# Patient Record
Sex: Male | Born: 1950
Health system: Southern US, Community
[De-identification: ages and names within clinical notes are randomized; demographics above are authoritative.]

## PROBLEM LIST (undated history)

## (undated) DIAGNOSIS — K5730 Diverticulosis of large intestine without perforation or abscess without bleeding: Secondary | ICD-10-CM

## (undated) DIAGNOSIS — T7840XA Allergy, unspecified, initial encounter: Secondary | ICD-10-CM

## (undated) DIAGNOSIS — R351 Nocturia: Secondary | ICD-10-CM

## (undated) DIAGNOSIS — J45909 Unspecified asthma, uncomplicated: Secondary | ICD-10-CM

## (undated) DIAGNOSIS — Z800 Family history of malignant neoplasm of digestive organs: Secondary | ICD-10-CM

## (undated) DIAGNOSIS — F329 Major depressive disorder, single episode, unspecified: Secondary | ICD-10-CM

## (undated) DIAGNOSIS — M19079 Primary osteoarthritis, unspecified ankle and foot: Secondary | ICD-10-CM

## (undated) DIAGNOSIS — M25571 Pain in right ankle and joints of right foot: Secondary | ICD-10-CM

## (undated) DIAGNOSIS — M2550 Pain in unspecified joint: Secondary | ICD-10-CM

## (undated) DIAGNOSIS — M19071 Primary osteoarthritis, right ankle and foot: Secondary | ICD-10-CM

## (undated) DIAGNOSIS — F419 Anxiety disorder, unspecified: Secondary | ICD-10-CM

## (undated) DIAGNOSIS — F32A Depression, unspecified: Secondary | ICD-10-CM

## (undated) DIAGNOSIS — H269 Unspecified cataract: Secondary | ICD-10-CM

## (undated) DIAGNOSIS — Z9889 Other specified postprocedural states: Secondary | ICD-10-CM

## (undated) DIAGNOSIS — Z86718 Personal history of other venous thrombosis and embolism: Secondary | ICD-10-CM

## (undated) DIAGNOSIS — S069XAA Unspecified intracranial injury with loss of consciousness status unknown, initial encounter: Secondary | ICD-10-CM

## (undated) DIAGNOSIS — G8929 Other chronic pain: Secondary | ICD-10-CM

## (undated) DIAGNOSIS — I83009 Varicose veins of unspecified lower extremity with ulcer of unspecified site: Secondary | ICD-10-CM

## (undated) DIAGNOSIS — Z86010 Personal history of colonic polyps: Secondary | ICD-10-CM

## (undated) DIAGNOSIS — R0789 Other chest pain: Secondary | ICD-10-CM

## (undated) DIAGNOSIS — M79672 Pain in left foot: Secondary | ICD-10-CM

## (undated) DIAGNOSIS — R0609 Other forms of dyspnea: Secondary | ICD-10-CM

## (undated) DIAGNOSIS — E7800 Pure hypercholesterolemia, unspecified: Secondary | ICD-10-CM

## (undated) DIAGNOSIS — R05 Cough: Secondary | ICD-10-CM

## (undated) DIAGNOSIS — I8390 Asymptomatic varicose veins of unspecified lower extremity: Secondary | ICD-10-CM

## (undated) DIAGNOSIS — E785 Hyperlipidemia, unspecified: Secondary | ICD-10-CM

## (undated) DIAGNOSIS — R269 Unspecified abnormalities of gait and mobility: Secondary | ICD-10-CM

## (undated) DIAGNOSIS — F988 Other specified behavioral and emotional disorders with onset usually occurring in childhood and adolescence: Secondary | ICD-10-CM

## (undated) DIAGNOSIS — M1990 Unspecified osteoarthritis, unspecified site: Secondary | ICD-10-CM

## (undated) DIAGNOSIS — I319 Disease of pericardium, unspecified: Secondary | ICD-10-CM

## (undated) DIAGNOSIS — I10 Essential (primary) hypertension: Secondary | ICD-10-CM

## (undated) DIAGNOSIS — L97909 Non-pressure chronic ulcer of unspecified part of unspecified lower leg with unspecified severity: Secondary | ICD-10-CM

## (undated) DIAGNOSIS — M7581 Other shoulder lesions, right shoulder: Secondary | ICD-10-CM

## (undated) DIAGNOSIS — I712 Thoracic aortic aneurysm, without rupture: Secondary | ICD-10-CM

## (undated) DIAGNOSIS — IMO0002 Reserved for concepts with insufficient information to code with codable children: Secondary | ICD-10-CM

## (undated) DIAGNOSIS — J189 Pneumonia, unspecified organism: Secondary | ICD-10-CM

## (undated) DIAGNOSIS — M25512 Pain in left shoulder: Secondary | ICD-10-CM

## (undated) DIAGNOSIS — G629 Polyneuropathy, unspecified: Secondary | ICD-10-CM

## (undated) DIAGNOSIS — S29011A Strain of muscle and tendon of front wall of thorax, initial encounter: Secondary | ICD-10-CM

## (undated) DIAGNOSIS — T464X5A Adverse effect of angiotensin-converting-enzyme inhibitors, initial encounter: Secondary | ICD-10-CM

## (undated) DIAGNOSIS — S069X9A Unspecified intracranial injury with loss of consciousness of unspecified duration, initial encounter: Secondary | ICD-10-CM

## (undated) DIAGNOSIS — Z0000 Encounter for general adult medical examination without abnormal findings: Secondary | ICD-10-CM

## (undated) DIAGNOSIS — Z5189 Encounter for other specified aftercare: Secondary | ICD-10-CM

## (undated) DIAGNOSIS — M79671 Pain in right foot: Secondary | ICD-10-CM

## (undated) DIAGNOSIS — R41844 Frontal lobe and executive function deficit: Secondary | ICD-10-CM

## (undated) DIAGNOSIS — R0602 Shortness of breath: Secondary | ICD-10-CM

## (undated) HISTORY — DX: Encounter for other specified aftercare: Z51.89

## (undated) HISTORY — DX: Unspecified intracranial injury with loss of consciousness of unspecified duration, initial encounter: S06.9X9A

## (undated) HISTORY — DX: Anxiety disorder, unspecified: F41.9

## (undated) HISTORY — PX: POLYPECTOMY: SHX149

## (undated) HISTORY — DX: Other specified behavioral and emotional disorders with onset usually occurring in childhood and adolescence: F98.8

## (undated) HISTORY — DX: Unspecified intracranial injury with loss of consciousness status unknown, initial encounter: S06.9XAA

## (undated) HISTORY — DX: Hyperlipidemia, unspecified: E78.5

## (undated) HISTORY — DX: Strain of muscle and tendon of front wall of thorax, initial encounter: S29.011A

## (undated) HISTORY — DX: Pain in right foot: M79.671

## (undated) HISTORY — DX: Pain in right ankle and joints of right foot: M25.571

## (undated) HISTORY — DX: Adverse effect of angiotensin-converting-enzyme inhibitors, initial encounter: T46.4X5A

## (undated) HISTORY — DX: Diverticulosis of large intestine without perforation or abscess without bleeding: K57.30

## (undated) HISTORY — DX: Frontal lobe and executive function deficit: R41.844

## (undated) HISTORY — DX: Polyneuropathy, unspecified: G62.9

## (undated) HISTORY — PX: COLONOSCOPY W/ POLYPECTOMY: SHX1380

## (undated) HISTORY — DX: Personal history of colonic polyps: Z86.010

## (undated) HISTORY — DX: Varicose veins of unspecified lower extremity with ulcer of unspecified site: I83.009

## (undated) HISTORY — DX: Unspecified osteoarthritis, unspecified site: M19.90

## (undated) HISTORY — DX: Pure hypercholesterolemia, unspecified: E78.00

## (undated) HISTORY — DX: Primary osteoarthritis, right ankle and foot: M19.071

## (undated) HISTORY — DX: Unspecified cataract: H26.9

## (undated) HISTORY — DX: Cough: R05

## (undated) HISTORY — PX: OTHER SURGICAL HISTORY: SHX169

## (undated) HISTORY — DX: Other shoulder lesions, right shoulder: M75.81

## (undated) HISTORY — DX: Pain in left foot: M79.672

## (undated) HISTORY — PX: FOOT FUSION: SHX956

## (undated) HISTORY — DX: Thoracic aortic aneurysm, without rupture: I71.2

## (undated) HISTORY — DX: Other forms of dyspnea: R06.09

## (undated) HISTORY — DX: Shortness of breath: R06.02

## (undated) HISTORY — DX: Family history of malignant neoplasm of digestive organs: Z80.0

## (undated) HISTORY — DX: Asymptomatic varicose veins of unspecified lower extremity: I83.90

## (undated) HISTORY — DX: Allergy, unspecified, initial encounter: T78.40XA

## (undated) HISTORY — DX: Reserved for concepts with insufficient information to code with codable children: IMO0002

## (undated) HISTORY — DX: Other specified postprocedural states: Z98.890

## (undated) HISTORY — DX: Encounter for general adult medical examination without abnormal findings: Z00.00

## (undated) HISTORY — DX: Primary osteoarthritis, unspecified ankle and foot: M19.079

## (undated) HISTORY — DX: Essential (primary) hypertension: I10

## (undated) HISTORY — DX: Pain in left shoulder: M25.512

## (undated) HISTORY — DX: Other chronic pain: G89.29

## (undated) HISTORY — DX: Unspecified asthma, uncomplicated: J45.909

## (undated) HISTORY — DX: Nocturia: R35.1

## (undated) HISTORY — DX: Pain in unspecified joint: M25.50

## (undated) HISTORY — PX: COLONOSCOPY: SHX174

## (undated) HISTORY — DX: Other chest pain: R07.89

## (undated) HISTORY — DX: Non-pressure chronic ulcer of unspecified part of unspecified lower leg with unspecified severity: L97.909

## (undated) HISTORY — DX: Personal history of other venous thrombosis and embolism: Z86.718

---
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
## 1975-04-10 HISTORY — PX: BURR HOLE OF CRANIUM: SUR169

## 1999-05-07 ENCOUNTER — Encounter: Payer: Self-pay | Admitting: Cardiology

## 1999-05-07 ENCOUNTER — Ambulatory Visit (HOSPITAL_COMMUNITY): Admission: RE | Admit: 1999-05-07 | Discharge: 1999-05-07 | Payer: Self-pay | Admitting: Cardiology

## 2001-07-14 ENCOUNTER — Encounter: Payer: Self-pay | Admitting: Orthopedic Surgery

## 2001-07-17 ENCOUNTER — Inpatient Hospital Stay (HOSPITAL_COMMUNITY): Admission: RE | Admit: 2001-07-17 | Discharge: 2001-07-20 | Payer: Self-pay | Admitting: Orthopedic Surgery

## 2001-07-17 ENCOUNTER — Encounter: Payer: Self-pay | Admitting: Orthopedic Surgery

## 2003-02-19 ENCOUNTER — Encounter: Admission: RE | Admit: 2003-02-19 | Discharge: 2003-02-19 | Payer: Self-pay | Admitting: Family Medicine

## 2003-02-19 ENCOUNTER — Encounter: Payer: Self-pay | Admitting: Family Medicine

## 2004-06-01 ENCOUNTER — Encounter: Admission: RE | Admit: 2004-06-01 | Discharge: 2004-06-01 | Payer: Self-pay | Admitting: Family Medicine

## 2005-11-23 ENCOUNTER — Ambulatory Visit: Payer: Self-pay | Admitting: Internal Medicine

## 2005-12-02 ENCOUNTER — Encounter (INDEPENDENT_AMBULATORY_CARE_PROVIDER_SITE_OTHER): Payer: Self-pay | Admitting: *Deleted

## 2005-12-02 ENCOUNTER — Ambulatory Visit: Payer: Self-pay | Admitting: Internal Medicine

## 2009-07-23 ENCOUNTER — Encounter: Admission: RE | Admit: 2009-07-23 | Discharge: 2009-07-23 | Payer: Self-pay | Admitting: Family Medicine

## 2009-09-17 ENCOUNTER — Ambulatory Visit: Payer: Self-pay | Admitting: Vascular Surgery

## 2009-11-07 ENCOUNTER — Encounter: Payer: Self-pay | Admitting: Cardiology

## 2009-11-07 ENCOUNTER — Ambulatory Visit: Payer: Self-pay | Admitting: Vascular Surgery

## 2009-11-12 ENCOUNTER — Ambulatory Visit (HOSPITAL_COMMUNITY): Admission: RE | Admit: 2009-11-12 | Discharge: 2009-11-13 | Payer: Self-pay | Admitting: Neurosurgery

## 2010-05-21 ENCOUNTER — Ambulatory Visit: Payer: Self-pay | Admitting: Cardiology

## 2010-07-28 ENCOUNTER — Ambulatory Visit: Payer: Self-pay | Admitting: Vascular Surgery

## 2010-09-01 ENCOUNTER — Encounter
Admission: RE | Admit: 2010-09-01 | Discharge: 2010-09-01 | Payer: Self-pay | Source: Home / Self Care | Attending: Neurosurgery | Admitting: Neurosurgery

## 2010-09-02 ENCOUNTER — Ambulatory Visit: Payer: Self-pay | Admitting: Vascular Surgery

## 2010-09-02 HISTORY — PX: ENDOVENOUS ABLATION SAPHENOUS VEIN W/ LASER: SUR449

## 2010-09-08 ENCOUNTER — Ambulatory Visit: Payer: Self-pay | Admitting: Vascular Surgery

## 2010-09-14 ENCOUNTER — Ambulatory Visit (HOSPITAL_COMMUNITY)
Admission: RE | Admit: 2010-09-14 | Discharge: 2010-09-14 | Payer: Self-pay | Source: Home / Self Care | Attending: Surgery | Admitting: Surgery

## 2010-09-14 ENCOUNTER — Other Ambulatory Visit: Payer: Self-pay | Admitting: Surgery

## 2010-10-01 ENCOUNTER — Emergency Department (HOSPITAL_COMMUNITY)
Admission: EM | Admit: 2010-10-01 | Discharge: 2010-10-01 | Payer: Self-pay | Source: Home / Self Care | Admitting: Emergency Medicine

## 2010-11-26 ENCOUNTER — Encounter: Payer: Self-pay | Admitting: Internal Medicine

## 2010-12-01 NOTE — Letter (Signed)
Summary: Colonoscopy Letter  [facility]  [address]   [place], [place] [address]   Phone: [phone]  Fax: [phone]      November 26, 2010 MRN: [id number]   Trevor [name] [address] [place], [place]  [address]   Dear Mr. KOWALKE,   According to your medical record, it is time for you to schedule a Colonoscopy. The American Cancer Society recommends this procedure as a method to detect early colon cancer. Patients with a family history of colon cancer, or a personal history of colon polyps or inflammatory bowel disease are at increased risk.  This letter has been generated based on the recommendations made at the time of your procedure. If you feel that in your particular situation this may no longer apply, please contact our office.  Please call our office at [phone] to schedule this appointment or to update your records at your earliest convenience.  Thank you for cooperating with [place] to provide you with the very best care possible.   Sincerely,   Stan Head, M.D.  [facility] Gastroenterology Division [phone]

## 2010-12-02 LAB — CBC
HCT: 39.7 % (ref 39.0–52.0)
Hemoglobin: 13.7 g/dL (ref 13.0–17.0)
MCHC: 34.4 g/dL (ref 30.0–36.0)
MCV: 92.2 fL (ref 78.0–100.0)
Platelets: 217 10*3/uL (ref 150–400)
RBC: 4.31 MIL/uL (ref 4.22–5.81)
RDW: 12.9 % (ref 11.5–15.5)
WBC: 7.6 10*3/uL (ref 4.0–10.5)

## 2010-12-02 LAB — BASIC METABOLIC PANEL
BUN: 22 mg/dL (ref 6–23)
CO2: 28 mEq/L (ref 19–32)
Calcium: 9 mg/dL (ref 8.4–10.5)
Chloride: 107 mEq/L (ref 96–112)
Creatinine, Ser: 1.08 mg/dL (ref 0.4–1.5)
GFR calc Af Amer: >60 mL/min (ref >60)
GFR calc non Af Amer: >60 mL/min (ref >60)
Glucose, Bld: 107 mg/dL — ABNORMAL HIGH (ref 70–99)
Potassium: 4.6 mEq/L (ref 3.5–5.1)
Sodium: 139 mEq/L (ref 135–145)

## 2010-12-02 LAB — SURGICAL PCR SCREEN
MRSA, PCR: NEGATIVE
Staphylococcus aureus: NEGATIVE

## 2010-12-08 ENCOUNTER — Encounter (INDEPENDENT_AMBULATORY_CARE_PROVIDER_SITE_OTHER): Payer: Self-pay | Admitting: *Deleted

## 2010-12-15 NOTE — Letter (Signed)
Summary: Pre Visit Letter Revised  [facility]  [address] [place], [place] [address]   Phone: [phone]  Fax: [phone]        12/08/2010 MRN: [id number] Trevor [name] [address] [place], [place]  [address]             Procedure Date:  02/04/2011 @ 8:30   recall colon-Dr. Elzie Rings   Welcome to the Gastroenterology Division at [facility].    You are scheduled to see a nurse for your pre-procedure visit on 01/21/2011 at 10:30 on the 3rd floor at [facility], [address]  We ask that you try to arrive at our office 15 minutes prior to your appointment time to allow for check-in.  Please take a minute to review the attached form.  If you answer "Yes" to one or more of the questions on the first page, we ask that you call the person listed at your earliest opportunity.  If you answer "No" to all of the questions, please complete the rest of the form and bring it to your appointment.    Your nurse visit will consist of discussing your medical and surgical history, your immediate family medical history, and your medications.   If you are unable to list all of your medications on the form, please bring the medication bottles to your appointment and we will list them.  We will need to be aware of both prescribed and over the counter drugs.  We will need to know exact dosage information as well.    Please be prepared to read and sign documents such as consent forms, a financial agreement, and acknowledgement forms.  If necessary, and with your consent, a friend or relative is welcome to sit-in on the nurse visit with you.  Please bring your insurance card so that we may make a copy of it.  If your insurance requires a referral to see a specialist, please bring your referral form from your primary care physician.  No co-pay is required for this nurse visit.     If you cannot keep your appointment, please call [phone] to cancel or reschedule prior  to your appointment date.  This allows [place] the opportunity to schedule an appointment for another patient in need of care.    Thank you for choosing [facility] for your medical needs.  We appreciate the opportunity to care for you.  Please visit [place] at our website  to learn more about our practice.  Sincerely, The Gastroenterology Division

## 2011-01-21 ENCOUNTER — Encounter: Payer: Self-pay | Admitting: Internal Medicine

## 2011-01-21 ENCOUNTER — Ambulatory Visit (AMBULATORY_SURGERY_CENTER): Payer: BC Managed Care – PPO | Admitting: *Deleted

## 2011-01-21 VITALS — Ht 76.0 in | Wt 250.0 lb

## 2011-01-21 DIAGNOSIS — Z1211 Encounter for screening for malignant neoplasm of colon: Secondary | ICD-10-CM

## 2011-01-21 MED ORDER — PEG-KCL-NACL-NASULF-NA ASC-C 100 G PO SOLR: ORAL | Status: DC

## 2011-01-26 NOTE — Assessment & Plan Note (Signed)
OFFICE VISIT   [name], Trevor [name]  DOB:  Jan 27, 1951                                       09/08/2010  CHART#:[id number]   Mr. Burdell returns today 1 week post laser ablation of the left great  saphenous vein and the left small saphenous vein by Dr. Arbie Cookey for severe  venous hypertension with a history of stasis ulcers in the left ankle  and severe skin changes.  He has had some moderate discomfort along the  course of great saphenous vein from the knee to the groin medially as  well as in the popliteal area.  He has been wearing his long-leg elastic  compression stocking and taking ibuprofen as instructed with no change  in the distal edema.  This discomfort is improving on a daily basis.   He denies any chest pain, dyspnea on exertion, PND, orthopnea, cough,  bronchitis or any other pulmonary type symptoms at this time.   PHYSICAL EXAMINATION:  Blood pressure is 164/95, heart rate 67,  respirations 14.  GENERAL:  He is a well-developed, well-nourished male who is in no  apparent distress, alert, oriented x3.  HEENT:  Exam normal for age.  EOMs intact.  LUNGS:  Clear to auscultation.  No rhonchi or wheezing.  CARDIOVASCULAR:  Regular rhythm.  No murmurs.  Carotid pulses 3+.  No  audible bruits.  ABDOMEN:  Soft, nontender with no masses.  Lower extremity exam reveals 3+ femoral, 2+ popliteal and 2+ posterior  tibial pulses bilaterally.  There is hyperpigmentation in the left ankle  medially near the medial malleolus.  There is mild to moderate  tenderness along the course of the great and small saphenous veins of  the left leg with no hematoma or infection.   Today I ordered a venous duplex exam which I reviewed and interpreted.  The great saphenous and small saphenous veins appear to be totally  closed.  There are some chronic changes in the deep venous system as  well as some significant reflux throughout the deep system.   I reassured him regarding  these findings.  He will convert to a short  leg stocking in 1 more week and wear this on a chronic basis.  He will  return to see [place] on a p.r.n. basis.     Quita Skye Hart Rochester, M.D.  Electronically Signed   JDL/MEDQ  D:  09/08/2010  T:  09/09/2010  Job:  [id number]

## 2011-01-26 NOTE — Procedures (Signed)
DUPLEX DEEP VENOUS EXAM - LOWER EXTREMITY   INDICATION:  Follow up left lower extremity laser ablation.   HISTORY:  Edema:  No.  Trauma/Surgery:  Left greater and short saphenous vein laser ablations  on 09/02/2010.  Pain:  Yes.  PE:  No.  Previous DVT:  No.  Anticoagulants:  Other:   DUPLEX EXAM:                CFV   SFV   PopV  PTV    GSV                R  L  R  L  R  L  R   L  R  L  Thrombosis    o  o     o     o      o     +  Spontaneous   +  +     +     +      +     o  Phasic        +  +     +     +      +     o  Augmentation  +  +     +     +      +     o  Compressible  +  +     +     +      +     o  Competent     o  o     o     o      o     o   Legend:  + - yes  o - no  p - partial  D - decreased   IMPRESSION:  1. The left greater saphenous vein appears totally occluded from the      left distal insertion site to near the saphenofemoral junction.      The left saphenofemoral junction is competent.  2. The left short saphenous vein appears totally occluded from the      distal insertion site to the popliteal fossa level.  Reflux of >500      milliseconds noted in the left popliteal fossa level, short      saphenous vein, which empties superiorly into the vein of      G[place]omini.  3. Diffuse mildly occlusive chronic venous scarring is noted      throughout the left femoropopliteal venous system and in the      proximal calf gastrocnemius veins.  Clinically significant reflux      of >500 milliseconds is noted throughout the left lower extremity      deep venous system.    _____________________________  Quita Skye Hart [place], M.D.   CH/MEDQ  D:  09/08/2010  T:  09/08/2010  Job:  [id number]
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
## 2011-01-26 NOTE — Procedures (Signed)
LOWER EXTREMITY VENOUS REFLUX EXAM   INDICATION:  Left lower extremity pain, swelling, ulcer and  varicosities.   EXAM:  Using color-flow imaging and pulse Doppler spectral analysis, the  left common femoral, superficial femoral, popliteal, posterior tibial,  greater and lesser saphenous veins were evaluated.  There is evidence  suggesting mild deep venous insufficiency in the left lower extremity.   The left saphenofemoral junction is competent.  The left great saphenous  vein is not competent with reflux of >500 milliseconds, with the caliber  as described below.   The left proximal short saphenous vein demonstrates incompetency.   GSV Diameter (used if found to be incompetent only)                                            Right    Left  Proximal Greater Saphenous Vein           cm       1.3 cm  Proximal-to-mid-thigh                     cm       0.65 cm  Mid thigh                                 cm       0.56 cm  Mid-distal thigh                          cm       cm  Distal thigh                              cm       0.45 cm  Knee                                      cm       0.44  Small Saphenous Vein - Proximal                    0.53 cm  Mid                                                0.48 cm  Distal                                             0.37 cm    IMPRESSION:  1. Left greater saphenous vein reflux was identified with caliber      ranging from 0.44 cm to 1.3 cm knee to groin.  2. The left great saphenous vein is not aneurysmal.  3. The left great saphenous vein is not tortuous.  4. The deep venous system is mildly incompetent.  5. The left lesser saphenous vein is not competent with reflux of      >565milliseconds.  6. Two incompetent perforator veins identified in the medial calf  measuring 0.37 cm and 0.44 cm and contributing to varicosities.  7. The small saphenous vein extends into a cranial vein in the distal      thigh measuring  0.87 cm and contributing to varicose veins.        ___________________________________________  Larina Earthly, M.D.   CJ/MEDQ  D:  11/07/2009  T:  11/07/2009  Job:  [id number]

## 2011-01-26 NOTE — Assessment & Plan Note (Signed)
OFFICE VISIT   [name], Trevor C  DOB:  02/13/1951                                       11/07/2009  WUJWJ#:[id number]   Patient presents today for continued follow-up of his left leg venous  ulcer.  He is having good healing of this with a much smaller and much  more superficial area.  He does have 1 small area of eschar still  remaining.  He will stop his Silvadene treatment and will continue with  his compression garments.   He did undergo venous duplex today in our office, and this reveals  reflux throughout his great and small saphenous vein.  He does have mild  reflux in the left deep system.   He will continue his compression garments.  Due to his recurrent  ulceration, I suspect that he will come to ablation for either pain or  recurrent ulceration.  He does report that he has had to restrict his  activity due to pain with prolonged standing with yard work and also  prolonged sitting.   He is to have cervical disk surgery next week, and he will wear his  compression garments.  I suggest graduated compression stockings around  the time of his surgery.   He will see [place] again in 6 months for continued follow-up.  He will  continue his compression garments.     Larina Earthly, M.D.  Electronically Signed   TFE/MEDQ  D:  11/07/2009  T:  11/07/2009  Job:  [id number]

## 2011-01-26 NOTE — Assessment & Plan Note (Signed)
OFFICE VISIT   Trevor [name], Trevor [name]  DOB:  Aug 12, 1951                                       09/02/2010  CHART#:[id number]   The patient presents to the office today for options for treatment of  severe venous hypertension in his left leg.  He had laser ablation of  his great saphenous vein from the mid calf to just below the  saphenofemoral junction and from his distal calf to just below the  saphenopopliteal junction.  This was under local tumescent anesthesia.  He had no immediate complication and was discharged to home.     Larina Earthly, M.D.  Electronically Signed   TFE/MEDQ  D:  09/02/2010  T:  09/03/2010  Job:  [id number]

## 2011-01-26 NOTE — Assessment & Plan Note (Signed)
OFFICE VISIT   [name], Trevor C  DOB:  1951-01-21                                       07/28/2010  UEAVW#:[id number]   Patient presents today for continued discussion regarding his severe  venous hypertension on his left leg.  He has fortunately been able to  heal his left medial malleolus ulcer, which he has had on recurrent  occasions.  The last time this was opened was in February to March 2011.   He does continue to have difficulty with prolonged standing.  He is  extremely compliant with his compression.  Despite this, he walks for  exercise and has to decrease his frequency and duration walking due to  leg pain.  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  Due to his severe leg pain, he is unable to play with  and care for the grandchildren as he has done in the past.  Also due to  severe pain, he is unable to do yard work, mowing, raking leaves, etc.   On reviewing his duplex, he does have gross reflux in his great and  small saphenous vein on the left.  I have recommended laser ablation of  his great and small saphenous vein.  He understands this is an  outpatient procedure under local anesthesia in our office.  He wishes to  proceed with this as soon as we can schedule his procedure.     Larina Earthly, M.D.  Electronically Signed   TFE/MEDQ  D:  07/28/2010  T:  07/29/2010  Job:  [id number]
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
## 2011-02-04 ENCOUNTER — Ambulatory Visit (AMBULATORY_SURGERY_CENTER): Payer: BC Managed Care – PPO | Admitting: Internal Medicine

## 2011-02-04 ENCOUNTER — Encounter: Payer: Self-pay | Admitting: Internal Medicine

## 2011-02-04 DIAGNOSIS — D126 Benign neoplasm of colon, unspecified: Secondary | ICD-10-CM

## 2011-02-04 DIAGNOSIS — Z1211 Encounter for screening for malignant neoplasm of colon: Secondary | ICD-10-CM

## 2011-02-04 DIAGNOSIS — Z860100 Personal history of colon polyps, unspecified: Secondary | ICD-10-CM

## 2011-02-04 DIAGNOSIS — K5730 Diverticulosis of large intestine without perforation or abscess without bleeding: Secondary | ICD-10-CM

## 2011-02-04 DIAGNOSIS — K635 Polyp of colon: Secondary | ICD-10-CM

## 2011-02-04 HISTORY — DX: Personal history of colon polyps, unspecified: Z86.0100

## 2011-02-04 HISTORY — DX: Personal history of colonic polyps: Z86.010

## 2011-02-04 MED ORDER — SODIUM CHLORIDE 0.9 % IV SOLN: 500.0000 mL | INTRAVENOUS | Status: DC

## 2011-02-04 NOTE — Patient Instructions (Signed)
Please refer to blue and green instruction sheets. Await pathology from polyps that were removed today. Thank you for choosing [facility] for your medical needs.

## 2011-02-05 ENCOUNTER — Telehealth: Payer: Self-pay | Admitting: *Deleted

## 2011-02-05 NOTE — Telephone Encounter (Signed)

## 2011-02-16 ENCOUNTER — Encounter: Payer: Self-pay | Admitting: Internal Medicine

## 2011-02-16 NOTE — Progress Notes (Signed)
Quick Note:  5 adenomas one was serrated Repeat routine colonoscopy 3 years 2015 Letter created ______

## 2011-02-25 ENCOUNTER — Other Ambulatory Visit: Payer: Self-pay | Admitting: Neurosurgery

## 2011-02-25 DIAGNOSIS — M542 Cervicalgia: Secondary | ICD-10-CM

## 2011-03-22 ENCOUNTER — Ambulatory Visit
Admission: RE | Admit: 2011-03-22 | Discharge: 2011-03-22 | Disposition: A | Discharge status: Home / Self Care | Payer: BC Managed Care – PPO | Source: Ambulatory Visit | Attending: Neurosurgery | Admitting: Neurosurgery

## 2011-03-22 DIAGNOSIS — M542 Cervicalgia: Secondary | ICD-10-CM

## 2012-03-31 ENCOUNTER — Encounter: Payer: Self-pay | Admitting: Cardiology

## 2012-11-14 ENCOUNTER — Other Ambulatory Visit: Payer: Self-pay | Admitting: *Deleted

## 2012-11-14 ENCOUNTER — Telehealth: Payer: Self-pay | Admitting: *Deleted

## 2012-11-14 DIAGNOSIS — I83029 Varicose veins of left lower extremity with ulcer of unspecified site: Secondary | ICD-10-CM

## 2012-11-14 NOTE — Telephone Encounter (Signed)
Trevor [name] is s/p left greater and smaller saphenous vein ablations on 09-02-2010.  Trevor [name] states that he has developed an ulcer on his left ankle about the size of a dime 1 week ago.  He states he is having no pain or swelling in the left leg.  Sent staff message to schedule Trevor [name] for a venous reflux exam of the left leg and a VV follow up appointment with Dr. Arbie Cookey as soon as possible and to notify him by phone.

## 2012-11-15 ENCOUNTER — Encounter: Payer: Self-pay | Admitting: Neurosurgery

## 2012-11-16 ENCOUNTER — Other Ambulatory Visit: Payer: Self-pay | Admitting: *Deleted

## 2012-11-16 ENCOUNTER — Encounter: Payer: Self-pay | Admitting: Vascular Surgery

## 2012-11-16 ENCOUNTER — Ambulatory Visit (INDEPENDENT_AMBULATORY_CARE_PROVIDER_SITE_OTHER): Payer: 59 | Admitting: Vascular Surgery

## 2012-11-16 ENCOUNTER — Encounter (INDEPENDENT_AMBULATORY_CARE_PROVIDER_SITE_OTHER): Payer: 59 | Admitting: *Deleted

## 2012-11-16 VITALS — BP 147/85 | HR 79 | Resp 18 | Ht 77.0 in | Wt 242.0 lb

## 2012-11-16 DIAGNOSIS — L97909 Non-pressure chronic ulcer of unspecified part of unspecified lower leg with unspecified severity: Secondary | ICD-10-CM

## 2012-11-16 DIAGNOSIS — I83029 Varicose veins of left lower extremity with ulcer of unspecified site: Secondary | ICD-10-CM

## 2012-11-16 DIAGNOSIS — I83009 Varicose veins of unspecified lower extremity with ulcer of unspecified site: Secondary | ICD-10-CM | POA: Insufficient documentation

## 2012-11-16 HISTORY — DX: Varicose veins of unspecified lower extremity with ulcer of unspecified site: I83.009

## 2012-11-16 HISTORY — DX: Varicose veins of unspecified lower extremity with ulcer of unspecified site: L97.909

## 2012-11-16 MED ORDER — SILVER SULFADIAZINE 1 % EX CREA: TOPICAL_CREAM | CUTANEOUS | Status: DC
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
## 2013-03-22 ENCOUNTER — Telehealth: Payer: Self-pay | Admitting: *Deleted

## 2013-03-22 ENCOUNTER — Other Ambulatory Visit: Payer: Self-pay | Admitting: *Deleted

## 2013-03-22 DIAGNOSIS — I83029 Varicose veins of left lower extremity with ulcer of unspecified site: Secondary | ICD-10-CM

## 2013-03-22 MED ORDER — SILVER SULFADIAZINE 1 % EX CREA: TOPICAL_CREAM | CUTANEOUS | Status: DC

## 2013-03-22 NOTE — Telephone Encounter (Signed)
Mr. Trevor [name] is s/p left great and small saphenous vein endovenous laser ablation 09-02-2010.  Last seen by Dr. Arbie Cookey on 11-16-2012 for venous ulcer on the medial aspect of his left ankle. Mr. Trevor [name] states that the left ankle ulcer has not healed and is approximately 3/4 inch in diameter.   Mr. Trevor [name] states he has been wearing knee high compression hose as Dr. Arbie Cookey had recommended but that he has been using Neosporin daily on the ulcer.  Mr. Trevor [name] states he did not get the prescription for Silvadene filled that Dr. Arbie Cookey had ordered and that he has lost the prescription and he has changed pharmacies.   Mr. Trevor [name] requests that a prescription for Silvadene 1% cream be called into the [facility] drug store on [address]. Offered follow up appointment to see Dr. Arbie Cookey.  Mr. Trevor [name] states he wants to try Silvadene cream and compression hose first to see if these measures will help the ulcer heal.  Encouraged him to call if his ulcer worsened or if he had any questions or concerns. Prescription for Silvadene 1% cream was called into the [facility] on [address].

## 2013-03-30 ENCOUNTER — Telehealth: Payer: Self-pay

## 2013-03-30 NOTE — Telephone Encounter (Signed)
Pt. Called to request appt. W/ Dr. Arbie Cookey ASAP.  States the ulcer on left ankle "looks infected".  Reports that the ulcer edges "are jagged and red, and draining a white drainage."  Denies swelling of left lower extremity.  Denies fever or chills.  States he noticed the change in the redness of the surrounding tissue yesterday.  States he is using Silvadene cream and applying compression hose as recommended.  Advised will call back to schedule appt.

## 2013-04-02 ENCOUNTER — Encounter: Payer: Self-pay | Admitting: Vascular Surgery

## 2013-04-03 ENCOUNTER — Ambulatory Visit (INDEPENDENT_AMBULATORY_CARE_PROVIDER_SITE_OTHER): Payer: 59 | Admitting: Vascular Surgery

## 2013-04-03 ENCOUNTER — Encounter: Payer: Self-pay | Admitting: Vascular Surgery

## 2013-04-03 VITALS — BP 130/81 | HR 69 | Resp 18 | Ht 76.0 in | Wt 240.8 lb

## 2013-04-03 DIAGNOSIS — I83009 Varicose veins of unspecified lower extremity with ulcer of unspecified site: Secondary | ICD-10-CM

## 2013-04-03 DIAGNOSIS — L97929 Non-pressure chronic ulcer of unspecified part of left lower leg with unspecified severity: Secondary | ICD-10-CM

## 2013-04-03 NOTE — Progress Notes (Signed)
Photos taken of ulcer left ankle.  Unna boot applied after left ankle area cleansed with [employer].  Mr. Trevor [name] tolerated unna boot application well.  Appointments for weekly Roland Rack boot applications made and FU appointment to see Dr. Arbie Cookey in one month made.
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
## 2013-04-11 ENCOUNTER — Ambulatory Visit (INDEPENDENT_AMBULATORY_CARE_PROVIDER_SITE_OTHER): Payer: 59 | Admitting: *Deleted

## 2013-04-11 DIAGNOSIS — I83009 Varicose veins of unspecified lower extremity with ulcer of unspecified site: Secondary | ICD-10-CM

## 2013-04-11 DIAGNOSIS — L97929 Non-pressure chronic ulcer of unspecified part of left lower leg with unspecified severity: Secondary | ICD-10-CM

## 2013-04-11 NOTE — Progress Notes (Signed)
Left ankle ulcer cleansed with Carraklen[employer].  Photos taken.  Left ankle ulcer appears smaller than last week.  Small amount of drainage noted from left ankle ulcer.  No odor noted.  [employer] reapplied.  Mr. Meenach tolerated procedure well.

## 2013-04-18 ENCOUNTER — Ambulatory Visit (INDEPENDENT_AMBULATORY_CARE_PROVIDER_SITE_OTHER): Payer: 59 | Admitting: *Deleted

## 2013-04-18 DIAGNOSIS — L97909 Non-pressure chronic ulcer of unspecified part of unspecified lower leg with unspecified severity: Secondary | ICD-10-CM

## 2013-04-18 DIAGNOSIS — I83029 Varicose veins of left lower extremity with ulcer of unspecified site: Secondary | ICD-10-CM

## 2013-04-18 NOTE — Progress Notes (Signed)
Left ankle ulcer cleansed with Carraklenz.  No odor detected. Small amount of drainage observed. Pictures with measurements taken. [employer] reapplied.  Mr. Trevor [name] tolerated Henriette Combs application well.  Reminded Mr. Weltz of weekly Henriette Combs change scheduled for 04-25-2013.

## 2013-04-24 ENCOUNTER — Ambulatory Visit (INDEPENDENT_AMBULATORY_CARE_PROVIDER_SITE_OTHER): Payer: 59

## 2013-04-24 DIAGNOSIS — L97909 Non-pressure chronic ulcer of unspecified part of unspecified lower leg with unspecified severity: Secondary | ICD-10-CM

## 2013-04-24 DIAGNOSIS — I83029 Varicose veins of left lower extremity with ulcer of unspecified site: Secondary | ICD-10-CM

## 2013-04-24 NOTE — Progress Notes (Signed)
Mr. Trevor [name], was seen today for his 3rd  Unna Boot change of the Left medial ankle.  Wound was clean and showing some signs of healing.  Pt had no complaints. New unna boot was applied with no difficulty and Mr. Trevor [name] will return in one week for Dr. Ilean Skill evaluation.Marland Kitchen  Pt. Voiced understanding of instruction.  Shay Jhaveri Eldridge-Lewis, RMA,AMT

## 2013-04-30 ENCOUNTER — Encounter: Payer: Self-pay | Admitting: Vascular Surgery

## 2013-05-01 ENCOUNTER — Encounter: Payer: Self-pay | Admitting: Vascular Surgery

## 2013-05-01 ENCOUNTER — Ambulatory Visit (INDEPENDENT_AMBULATORY_CARE_PROVIDER_SITE_OTHER): Payer: 59 | Admitting: Vascular Surgery

## 2013-05-01 VITALS — BP 121/74 | HR 70 | Resp 18 | Ht 76.0 in | Wt 233.9 lb

## 2013-05-01 DIAGNOSIS — I83009 Varicose veins of unspecified lower extremity with ulcer of unspecified site: Secondary | ICD-10-CM

## 2013-05-01 DIAGNOSIS — I83029 Varicose veins of left lower extremity with ulcer of unspecified site: Secondary | ICD-10-CM

## 2013-05-01 NOTE — Progress Notes (Signed)
Here today for followup of his chronic venous hypertension and venous stasis ulcer. He has been having weekly Unna boot therapy on our office. He has had  continued improvement.  Today his wound looks quite good it is much more superficial with a good granulating base and is contracting. There is no surrounding erythema  Impression and plan stable with improvement of chronic venous ulceration with antibiotic treatment. We'll continue weekly Unna boot change and I will see him in 6 weeks. He is going to the beach and will be out of town for one week and will switch back to Silvadene daily with compression for that week and then we fitted back into the [employer]

## 2013-05-09 ENCOUNTER — Ambulatory Visit (INDEPENDENT_AMBULATORY_CARE_PROVIDER_SITE_OTHER): Payer: 59 | Admitting: *Deleted

## 2013-05-09 DIAGNOSIS — I83029 Varicose veins of left lower extremity with ulcer of unspecified site: Secondary | ICD-10-CM

## 2013-05-09 DIAGNOSIS — I83009 Varicose veins of unspecified lower extremity with ulcer of unspecified site: Secondary | ICD-10-CM

## 2013-05-09 NOTE — Progress Notes (Signed)
Left ankle ulcer cleansed with Carraklenz.  Small amount of discharge noted. No odor detected.  Mr. Brach states he is experiencing burning sensation occasionally around the left ankle ulcer.  Ulcer appears to be granulating and decreasing in size from last week's visit.  Photos taken and archived in EPIC. [employer] reapplied. Mr. Grandison tolerated unna boot change well.  Mr. Lavallee is away on vacation next week so per Dr. Bosie Helper instructions he will use Silvadene and sterile 4x4s to dress the ulcer area and will wear compression hose.  Verified next [employer] change scheduled for 05-23-2013 with him.

## 2013-05-22 ENCOUNTER — Encounter (INDEPENDENT_AMBULATORY_CARE_PROVIDER_SITE_OTHER): Payer: 59

## 2013-05-22 DIAGNOSIS — I83009 Varicose veins of unspecified lower extremity with ulcer of unspecified site: Secondary | ICD-10-CM

## 2013-05-30 ENCOUNTER — Ambulatory Visit (INDEPENDENT_AMBULATORY_CARE_PROVIDER_SITE_OTHER): Payer: 59 | Admitting: *Deleted

## 2013-05-30 DIAGNOSIS — I83009 Varicose veins of unspecified lower extremity with ulcer of unspecified site: Secondary | ICD-10-CM

## 2013-05-30 DIAGNOSIS — I83029 Varicose veins of left lower extremity with ulcer of unspecified site: Secondary | ICD-10-CM

## 2013-05-30 NOTE — Progress Notes (Signed)
Left ankle ulcer significantly smaller and granulating.   Photos taken.  Left ankle ulcer cleansed with Carraklenz.  [employer] applied.  Trevor [name] in good spirits and tolerated unna boot application well.  He is scheduled for [employer] change in one week 06-06-2013.

## 2013-06-05 ENCOUNTER — Ambulatory Visit: Payer: 59 | Admitting: Vascular Surgery

## 2013-06-06 ENCOUNTER — Ambulatory Visit (INDEPENDENT_AMBULATORY_CARE_PROVIDER_SITE_OTHER): Payer: 59 | Admitting: *Deleted

## 2013-06-06 DIAGNOSIS — I83029 Varicose veins of left lower extremity with ulcer of unspecified site: Secondary | ICD-10-CM

## 2013-06-06 DIAGNOSIS — I83009 Varicose veins of unspecified lower extremity with ulcer of unspecified site: Secondary | ICD-10-CM

## 2013-06-06 NOTE — Progress Notes (Signed)
Left ankle ulcer continuing to granulate and fill in and is decreasing in size.  Photos taken.  Left ankle ulcer cleansed with Carraklenz.  [employer] applied.  Mr. Papillion tolerated Henriette Combs application well.  Will return next week for [employer] change.

## 2013-06-11 ENCOUNTER — Encounter: Payer: Self-pay | Admitting: Vascular Surgery

## 2013-06-12 ENCOUNTER — Encounter: Payer: Self-pay | Admitting: Vascular Surgery

## 2013-06-12 ENCOUNTER — Ambulatory Visit (INDEPENDENT_AMBULATORY_CARE_PROVIDER_SITE_OTHER): Payer: 59 | Admitting: Vascular Surgery

## 2013-06-12 VITALS — BP 128/86 | HR 75 | Ht 76.0 in | Wt 238.0 lb

## 2013-06-12 DIAGNOSIS — I83009 Varicose veins of unspecified lower extremity with ulcer of unspecified site: Secondary | ICD-10-CM

## 2013-06-12 DIAGNOSIS — I83029 Varicose veins of left lower extremity with ulcer of unspecified site: Secondary | ICD-10-CM

## 2013-06-12 NOTE — Progress Notes (Signed)
The patient presents today for followup of his chronic venous stasis disease. He has a good healing of his medial malleolus venous ulcer. He has been compliant with use of boot for approximately 6 weeks. I reviewed pictures of his progress. This does show a good closure. He now has an area of open ulceration approximately 1/2 cm in diameter with excellent granulation tissue. He is new skin growth in the periphery of this area.  Continues to have a 2+ dorsalis pedis pulse  Will have an Unna boot therapy with a good place today and then weekly. I will see him again in 6 weeks. I would anticipate complete healing that time

## 2013-06-20 ENCOUNTER — Ambulatory Visit (INDEPENDENT_AMBULATORY_CARE_PROVIDER_SITE_OTHER): Payer: 59 | Admitting: *Deleted

## 2013-06-20 DIAGNOSIS — I83029 Varicose veins of left lower extremity with ulcer of unspecified site: Secondary | ICD-10-CM

## 2013-06-20 DIAGNOSIS — I83009 Varicose veins of unspecified lower extremity with ulcer of unspecified site: Secondary | ICD-10-CM

## 2013-06-20 NOTE — Progress Notes (Signed)
Left ankle ulcer has improved dramatically.  Left ankle ulcer is 2 cm in length without drainage, odor , or tenderness.  Ulcer is continuing to granulate and fill in and redness around the ulcer is lessening.  Photos taken. Left ankle ulcer cleansed with Carraklenz.  [employer] reapplied.  Reminded Mr. Dirico of follow up appointment for [facility] change on 06-27-2013.

## 2013-06-21 ENCOUNTER — Other Ambulatory Visit: Payer: Self-pay | Admitting: Dermatology

## 2013-06-25 ENCOUNTER — Encounter: Payer: Self-pay | Admitting: Internal Medicine

## 2013-06-25 NOTE — Telephone Encounter (Signed)
Error

## 2013-06-27 ENCOUNTER — Ambulatory Visit (INDEPENDENT_AMBULATORY_CARE_PROVIDER_SITE_OTHER): Payer: 59

## 2013-06-27 DIAGNOSIS — I83029 Varicose veins of left lower extremity with ulcer of unspecified site: Secondary | ICD-10-CM

## 2013-06-27 DIAGNOSIS — I83009 Varicose veins of unspecified lower extremity with ulcer of unspecified site: Secondary | ICD-10-CM

## 2013-06-27 NOTE — Progress Notes (Signed)
Trevor [name] came into the office today for a unna boot change to his L LE. He had already removed the unna boot that was applied last week. The unna boot was reapplied without difficulty. Trevor [name] was aware of his appointment on next Wednesday. Armie Moren Maness-Harrison CMA, AAMA

## 2013-07-04 ENCOUNTER — Ambulatory Visit (INDEPENDENT_AMBULATORY_CARE_PROVIDER_SITE_OTHER): Payer: 59 | Admitting: *Deleted

## 2013-07-04 DIAGNOSIS — I83009 Varicose veins of unspecified lower extremity with ulcer of unspecified site: Secondary | ICD-10-CM

## 2013-07-04 DIAGNOSIS — I83029 Varicose veins of left lower extremity with ulcer of unspecified site: Secondary | ICD-10-CM

## 2013-07-04 NOTE — Progress Notes (Signed)
Trevor [name] has taken [employer] off this morning prior to office visit. Left calf and ankle is clean and dry.   Left ankle ulcer is 75% scabbed over.   No drainage, odor, or redness noted.  Photos taken.  [employer] reapplied.  Trevor [name] tolerated Henriette Combs application well.  VV follow-up appointment with Dr. Arbie Cookey confirmed with Mr. Lemmons for Tuesday, October 28 at 8:30am.

## 2013-07-09 ENCOUNTER — Encounter: Payer: Self-pay | Admitting: Vascular Surgery

## 2013-07-10 ENCOUNTER — Encounter: Payer: Self-pay | Admitting: Vascular Surgery

## 2013-07-10 ENCOUNTER — Ambulatory Visit (INDEPENDENT_AMBULATORY_CARE_PROVIDER_SITE_OTHER): Payer: 59 | Admitting: Vascular Surgery

## 2013-07-10 VITALS — BP 149/84 | HR 87 | Resp 18 | Ht 76.0 in | Wt 239.0 lb

## 2013-07-10 DIAGNOSIS — I83029 Varicose veins of left lower extremity with ulcer of unspecified site: Secondary | ICD-10-CM

## 2013-07-10 DIAGNOSIS — I83009 Varicose veins of unspecified lower extremity with ulcer of unspecified site: Secondary | ICD-10-CM

## 2013-07-10 NOTE — Progress Notes (Signed)
The patient has today for continued followup of his chronic venous stasis disease. He has appeared to healing of his left medial ankle wound. There is an eschar over approximately 75% of the wound the remainder as a one small area of approximately 2-3 mm opening was very superficial granulating tissue. I debrided the eschar and he does have a total healing underneath this. He will have into place for one additional week and then dropped back to Silvadene and compression. We'll follow with [place] on an as-needed basis

## 2013-07-17 ENCOUNTER — Other Ambulatory Visit: Payer: Self-pay | Admitting: Dermatology

## 2013-07-19 ENCOUNTER — Ambulatory Visit (INDEPENDENT_AMBULATORY_CARE_PROVIDER_SITE_OTHER): Payer: 59 | Admitting: *Deleted

## 2013-07-19 DIAGNOSIS — I83009 Varicose veins of unspecified lower extremity with ulcer of unspecified site: Secondary | ICD-10-CM

## 2013-07-19 DIAGNOSIS — I83029 Varicose veins of left lower extremity with ulcer of unspecified site: Secondary | ICD-10-CM

## 2013-07-19 NOTE — Progress Notes (Signed)
Trevor [name] saw Dr. Arbie Cookey on 07-10-2013.  Left ankle ulcer was essentially closed, and Unna Boot was applied.  Dr. Arbie Cookey released Trevor [name] and was told to return prn ulcer reopening.  Trevor [name] presents today and states that he removed Trevor [name] on 07-11-2013 and went to [place] for a long weekend trip from 07-11-2013-----07-14-2013.  He states that he walked long days without compression.  He states that left ulcer ankle reopened on Saturday.  Today,  Trevor [name] presents with two small left ankle ulcers ( superficial ulcers, drainage noted , no odor).   Cleaned left ankle ulcers with Carraklenz.  Photos taken.  Applied [employer]   Trevor [name] to return Wednesday, November 12 for [employer] change.

## 2013-07-24 ENCOUNTER — Ambulatory Visit: Payer: 59 | Admitting: Vascular Surgery

## 2013-07-25 ENCOUNTER — Ambulatory Visit (INDEPENDENT_AMBULATORY_CARE_PROVIDER_SITE_OTHER): Payer: 59 | Admitting: *Deleted

## 2013-07-25 DIAGNOSIS — I83029 Varicose veins of left lower extremity with ulcer of unspecified site: Secondary | ICD-10-CM

## 2013-07-25 DIAGNOSIS — I83893 Varicose veins of bilateral lower extremities with other complications: Secondary | ICD-10-CM

## 2013-07-25 NOTE — Progress Notes (Signed)
Cleansed leg ankle ulcer and surrounding site with Carraklenz.  Photos taken.  Left ankle ulcer pinpoint size.  No odor or drainage noted. [employer] reapplied.   Mr. Breeding to return 08-01-2013 for [employer] change.

## 2013-08-01 ENCOUNTER — Ambulatory Visit (INDEPENDENT_AMBULATORY_CARE_PROVIDER_SITE_OTHER): Payer: 59 | Admitting: *Deleted

## 2013-08-01 DIAGNOSIS — I83893 Varicose veins of bilateral lower extremities with other complications: Secondary | ICD-10-CM

## 2013-08-01 DIAGNOSIS — I83009 Varicose veins of unspecified lower extremity with ulcer of unspecified site: Secondary | ICD-10-CM

## 2013-08-03 NOTE — Progress Notes (Signed)
Patient in for unna boot change. He had removed unna boot and showered before coming to office.  Wound is healing and showing no sign of drainage.  He is returning 08/08/13. Patient is aware that if changes occur before his appt he is to call us.

## 2013-08-08 ENCOUNTER — Ambulatory Visit (INDEPENDENT_AMBULATORY_CARE_PROVIDER_SITE_OTHER): Payer: Self-pay | Admitting: *Deleted

## 2013-08-08 DIAGNOSIS — I83029 Varicose veins of left lower extremity with ulcer of unspecified site: Secondary | ICD-10-CM

## 2013-08-08 DIAGNOSIS — I83893 Varicose veins of bilateral lower extremities with other complications: Secondary | ICD-10-CM

## 2013-08-08 NOTE — Progress Notes (Signed)
Trevor [name] has removed Trevor [name] this morning and showered.   Is wearing knee high compression hose with 2x2 gauze over healed left ankle ulcer.  Photo taken.   Left ankle ulcer healed/scabbed over.   [employer] not needed.  Silvadene applied, 2x2 gauze over scabbed area and compression hose reapplied.  Follow up appointment not required. Reviewed keeping left leg elevated when sitting and wearing compression hose daily.   Trevor [name] will call [facility] if he has questions or concerns or if ulcer reopens.

## 2014-02-07 ENCOUNTER — Encounter: Payer: Self-pay | Admitting: Internal Medicine

## 2014-09-04 ENCOUNTER — Ambulatory Visit: Payer: Self-pay | Admitting: Podiatry

## 2014-09-09 ENCOUNTER — Ambulatory Visit (INDEPENDENT_AMBULATORY_CARE_PROVIDER_SITE_OTHER): Payer: 59 | Admitting: Podiatry

## 2014-09-09 ENCOUNTER — Ambulatory Visit (INDEPENDENT_AMBULATORY_CARE_PROVIDER_SITE_OTHER): Payer: 59

## 2014-09-09 ENCOUNTER — Encounter: Payer: Self-pay | Admitting: Podiatry

## 2014-09-09 VITALS — HR 85 | Resp 14 | Ht 77.0 in | Wt 232.0 lb

## 2014-09-09 DIAGNOSIS — M79675 Pain in left toe(s): Secondary | ICD-10-CM

## 2014-09-09 DIAGNOSIS — L03031 Cellulitis of right toe: Secondary | ICD-10-CM

## 2014-09-09 DIAGNOSIS — L02611 Cutaneous abscess of right foot: Secondary | ICD-10-CM

## 2014-09-09 MED ORDER — AMOXICILLIN-POT CLAVULANATE 875-125 MG PO TABS
1.0000 | ORAL_TABLET | Freq: Two times a day (BID) | ORAL | Status: DC
Start: 2014-09-09 — End: 2015-05-15

## 2014-09-09 NOTE — Patient Instructions (Signed)
Apply topical antibiotic twice a day to skin ulcers on second right toe and wear white cotton sock Wear the surgical shoe on the right foot Begin taking oral antibiotics by mouth with food one twice a day 10 days  Betadine Soak Instructions  Purchase an 8 oz. bottle of BETADINE solution (Povidone)  THE DAY AFTER THE PROCEDURE  Place 1 tablespoon of betadine solution in a quart of warm tap water.  Submerge your foot or feet with outer bandage intact for the initial soak; this will allow the bandage to become moist and wet for easy lift off.  Once you remove your bandage, continue to soak in the solution for 20 minutes.  This soak should be done twice a day.  Next, remove your foot or feet from solution, blot dry the affected area and cover.  You may use a band aid large enough to cover the area or use gauze and tape.  Apply other medications to the area as directed by the doctor such as cortisporin otic solution (ear drops) or neosporin.  IF YOUR SKIN BECOMES IRRITATED WHILE USING THESE INSTRUCTIONS, IT IS OKAY TO SWITCH TO EPSOM SALTS AND WATER OR WHITE VINEGAR AND WATER.

## 2014-09-09 NOTE — Progress Notes (Signed)
   Subjective:    Patient ID: Trevor [name], male    DOB: 29-Aug-1951, 63 y.o.   MRN: [id number]  HPI Comments: N callouses L Left 5th MPJ plantar and 2nd toe distal    Right 1st, 2nd toe and 1st medial MPJ and 2nd medial toe corn C hard painful skin and bleeding corn A hammer toes  T home pedicure  Pt request debridement of 10 toenails.   patient presents today requesting debridement of keratoses and nails and is unaware of any symptoms in the second right toe. He said he wore toe separator between the right hallux and second right toe, however, he did remove the toe separator and apparently did not notice any swelling or color change in the second right toe when he remove the toe separator.  He does have a history of neuropathy associated with traumatic brain injury He is disabled  History of varicose ulcers  Review of Systems  All other systems reviewed and are negative.      Objective:   Physical Exam   Orientated 3  Vascular: DP and PT pulses 2/4 bilaterally  Neurological: Vibratory sensation nonreactive bilaterally Sensation to 10 g monofilament wire intact 0/5 bilaterally Ankle reflexes reactive bilaterally  Dermatological: The second right toe is erythematous, edematous with a superficial ulcer on the medial border of the second right toe 5 mm in diameter with a granular base. There erythema extends 15 mm proximal to the dorsal second MPJ on the right foot Keratoses noted around the medial plantar first MPJ right, hallux right and second right toe Keratoses second left toe and plantar fifth left MPJ The toenails are hypertrophic and incurvated and elongated  Musculoskeletal: HAV deformity right Hammertoe deformities 2-4 right Patient walks with a cane and does appear to have a stable gait  X-ray examination right foot  Intact bony structure without any fracture and/or dislocation Hallux interphalangeus Well-healed fracture third metatarsal Increased  soft tissue outline second right toe without emphysema  Radiographic impression: No acute bony abnormality noted in the right foot     Assessment & Plan:   Assessment: Satisfactory vascular status Profound sensory neuropathy bilaterally Ulceration cellulitis second right toe Multiple keratoses right and left Onychomycoses 6-10  Plan: Advised patient that he had a cellulitis/ulcer on the second right toe and this was the primary problem that needed immediate attention today. I deferred in debridement of skin and nails  Rx Augmentin 875/125 twice a day 10 days Betadine soaks prescribed daily Apply topical antibiotic ointment twice a day to the skin ulcer in the second right toe after soaking Surgical shoe dispensed to wear and right foot  Reappoint upon completion of 10 days of oral antibiotics

## 2014-09-23 ENCOUNTER — Ambulatory Visit (INDEPENDENT_AMBULATORY_CARE_PROVIDER_SITE_OTHER): Payer: 59 | Admitting: Podiatry

## 2014-09-23 ENCOUNTER — Encounter: Payer: Self-pay | Admitting: Podiatry

## 2014-09-23 VITALS — BP 155/101 | HR 62 | Temp 98.1°F | Resp 13

## 2014-09-23 DIAGNOSIS — L02611 Cutaneous abscess of right foot: Secondary | ICD-10-CM

## 2014-09-23 DIAGNOSIS — L03031 Cellulitis of right toe: Secondary | ICD-10-CM

## 2014-09-23 MED ORDER — SULFAMETHOXAZOLE-TRIMETHOPRIM 800-160 MG PO TABS: 1.0000 | ORAL_TABLET | Freq: Two times a day (BID) | ORAL | Status: DC

## 2014-09-23 NOTE — Patient Instructions (Signed)
Continue daily Betadine soaks to the left foot Apply topical antibiotic ointment to skin ulcer on second right toe daily Continue wear surgical shoe on left foot Reduce the amount of standing and walking is much as possible Begin new oral antibiotic one tablet twice a day with a full glass of water

## 2014-09-24 NOTE — Progress Notes (Signed)
Patient ID: Trevor [name], male   DOB: August 26, 1951, 64 y.o.   MRN: [id number]  Subjective: This patient presents for follow-up visit of 09/10/2014 for treatment of ulceration cellulitis second right toe associated with neuropathy. He has completed approximately 9 out of 10 days of Augmentin and denies any complaint from this medication Patient admits that he walks his dog up to a mile a day  Objective: Orientated 3 The second right toe is mildly edematous, erythematous with a superficial ulcer on the medial border and scaling on the medial proximal aspect of the toe. There is a low grade erythema and slight edema that extends approximately 10 mm proximal to the second left MPJ area. There is no malodor, or warmth in the second right toe area.  Assessment: Improving ulceration cellulitis second right toe  Plan: Rx Bactrim DS by mouth twice a day 10 days Continue Betadine soaks and application of topical antibiotic ointment daily Continue wearing surgical shoe on right foot Continue to apply topical antibiotic ointment to the second right toe Patient advised to reduce the amount of standing and walking  Reappoint 2 weeks

## 2014-10-07 ENCOUNTER — Encounter: Payer: Self-pay | Admitting: Podiatry

## 2014-10-07 ENCOUNTER — Ambulatory Visit (INDEPENDENT_AMBULATORY_CARE_PROVIDER_SITE_OTHER): Payer: 59 | Admitting: Podiatry

## 2014-10-07 VITALS — BP 107/85 | HR 81 | Temp 98.6°F | Resp 12

## 2014-10-07 DIAGNOSIS — L02611 Cutaneous abscess of right foot: Secondary | ICD-10-CM

## 2014-10-07 DIAGNOSIS — L03031 Cellulitis of right toe: Secondary | ICD-10-CM | POA: Diagnosis not present

## 2014-10-07 MED ORDER — SULFAMETHOXAZOLE-TRIMETHOPRIM 800-160 MG PO TABS: 1.0000 | ORAL_TABLET | Freq: Two times a day (BID) | ORAL | Status: DC

## 2014-10-07 NOTE — Progress Notes (Signed)
Patient ID: Trevor [name], male   DOB: 03/17/1951, 64 y.o.   MRN: [id number]  Subjective: This patient presents for follow-up visit with the initial encounter on 09/10/2014 for ulceration cellulitis in the second right toe associated with neuropathy. He is completed 10 days of Augmentin 875/125 and and recently completed 10 days of Bactrim DS. Denies any complaints from the Bactrim.  Objective: Orientated 3 Second right toe remains mildly erythematous edematous with a small blister formation at the dorsal second MPJ area. The medial aspect has a small superficial ulcer several millimeters in diameter. There is no active drainage, warmth, malodor noted.  Assessment: Residual cellulitis and ulceration second right toe Possible contact dermatitis to topical antibiotic ointment  Plan: DC topical antibiotic ointment Patient has Silvadene cream which we will substitute for the antibiotic ointment Continue Bactrim DS by mouth twice a day 10 days Maintain once daily diluted Betadine soaks Continue wear surgical shoe on right foot  Reappoint 2 weeks

## 2014-10-07 NOTE — Patient Instructions (Signed)
Continue on Bactrim DS by mouth one twice a day 10 days Betadine soaks warm water only once a day  Discontinue the use of triple antibiotic ointment and begin using Silvadene cream once a day to the skin ulcer in the second right toe Continue to wear surgical shoe in the right foot

## 2014-10-21 ENCOUNTER — Encounter: Payer: Self-pay | Admitting: Podiatry

## 2014-10-21 ENCOUNTER — Ambulatory Visit (INDEPENDENT_AMBULATORY_CARE_PROVIDER_SITE_OTHER): Payer: 59 | Admitting: Podiatry

## 2014-10-21 VITALS — BP 145/94 | HR 72 | Temp 97.3°F | Resp 13

## 2014-10-21 DIAGNOSIS — L02611 Cutaneous abscess of right foot: Secondary | ICD-10-CM

## 2014-10-21 DIAGNOSIS — L03031 Cellulitis of right toe: Secondary | ICD-10-CM

## 2014-10-21 NOTE — Patient Instructions (Signed)
Apply a thin layer of topical antibiotic ointment between the right great toe and second toe daily Soak in Betadine if drainage is noted Continue to wear surgical shoe and a white cotton sock daily until the skin appears normal

## 2014-10-22 NOTE — Progress Notes (Signed)
Patient ID: Trevor [name], male   DOB: [birth year], 64 y.o.   MRN: [id number]  Subjective: This patient presents for follow-up care with the initial encounter on 09/10/2014 for ulceration cellulitis on the second right toe associated with neuropathy. Is completed 10 days of Augmentin 875 4/25 twice a day and 20 days of Bactrim DS twice a day.  Objective: Orientated 3 Oral temperature 97.3 Second right webspaces macerated without any open wounds Mild low-grade erythema second right toe that does not extend beyond basis second right toe   Assessment: Resolved ulceration and improving cellulitis second right toe  Plan: Patient advised to apply thin layer of topical antibiotic ointment between right hallux and second right toe daily Use diluted Betadine soaks if any drainage is noted Continue to wear surgical shoe and white cotton sock until skin appears normal  Reappoint at patient's request

## 2015-01-03 ENCOUNTER — Encounter: Payer: Self-pay | Admitting: Internal Medicine

## 2015-03-19 ENCOUNTER — Encounter: Payer: Self-pay | Admitting: Internal Medicine

## 2015-05-15 ENCOUNTER — Ambulatory Visit (AMBULATORY_SURGERY_CENTER): Payer: Self-pay | Admitting: *Deleted

## 2015-05-15 VITALS — Ht 74.5 in | Wt 251.4 lb

## 2015-05-15 DIAGNOSIS — Z86010 Personal history of colonic polyps: Secondary | ICD-10-CM

## 2015-05-15 NOTE — Progress Notes (Signed)
Denies allergies to eggs or soy products. Denies complications with sedation or anesthesia. Denies O2 use. Denies use of diet or weight loss medications.  Emmi instructions given for colonoscopy.  

## 2015-05-29 ENCOUNTER — Ambulatory Visit (AMBULATORY_SURGERY_CENTER): Payer: 59 | Admitting: Internal Medicine

## 2015-05-29 ENCOUNTER — Encounter: Payer: Self-pay | Admitting: Internal Medicine

## 2015-05-29 VITALS — BP 157/100 | HR 65 | Temp 96.2°F | Resp 24 | Ht 74.5 in | Wt 251.0 lb

## 2015-05-29 DIAGNOSIS — Z86010 Personal history of colonic polyps: Secondary | ICD-10-CM

## 2015-05-29 DIAGNOSIS — D129 Benign neoplasm of anus and anal canal: Secondary | ICD-10-CM

## 2015-05-29 DIAGNOSIS — D128 Benign neoplasm of rectum: Secondary | ICD-10-CM | POA: Diagnosis not present

## 2015-05-29 DIAGNOSIS — D120 Benign neoplasm of cecum: Secondary | ICD-10-CM | POA: Diagnosis not present

## 2015-05-29 DIAGNOSIS — D122 Benign neoplasm of ascending colon: Secondary | ICD-10-CM

## 2015-05-29 DIAGNOSIS — D124 Benign neoplasm of descending colon: Secondary | ICD-10-CM | POA: Diagnosis not present

## 2015-05-29 DIAGNOSIS — Z800 Family history of malignant neoplasm of digestive organs: Secondary | ICD-10-CM | POA: Insufficient documentation

## 2015-05-29 DIAGNOSIS — D123 Benign neoplasm of transverse colon: Secondary | ICD-10-CM

## 2015-05-29 DIAGNOSIS — D125 Benign neoplasm of sigmoid colon: Secondary | ICD-10-CM

## 2015-05-29 MED ORDER — SODIUM CHLORIDE 0.9 % IV SOLN: 500.0000 mL | INTRAVENOUS | Status: DC

## 2015-05-29 NOTE — Progress Notes (Signed)
Report to PACU, RN, vss, BBS= Clear.  

## 2015-05-29 NOTE — Op Note (Signed)
[facility]  [address] [place], [id number]   COLONOSCOPY PROCEDURE REPORT  PATIENT: Trevor [name], Trevor [name]  MR#: [id number] BIRTHDATE: 04/07/1951 , 64  yrs. old GENDER: male ENDOSCOPIST: Gatha Mayer, MD, [facility] PROCEDURE DATE:  05/29/2015 PROCEDURE:   Colonoscopy, surveillance and Colonoscopy with snare polypectomy First Screening Colonoscopy - Avg.  risk and is 50 yrs.  old or older - No.  Prior Negative Screening - Now for repeat screening. N/A  History of Adenoma - Now for follow-up colonoscopy & has been > or = to 3 yrs.  Yes hx of adenoma.  Has been 3 or more years since last colonoscopy.  Polyps removed today? Yes ASA CLASS:   Class II INDICATIONS:Surveillance due to prior colonic neoplasia and PH Colon Adenoma. MEDICATIONS: Propofol 250 mg IV and Monitored anesthesia care  DESCRIPTION OF PROCEDURE:   After the risks benefits and alternatives of the procedure were thoroughly explained, informed consent was obtained.  The digital rectal exam revealed no abnormalities of the rectum, revealed no prostatic nodules, and revealed the prostate was not enlarged.   The LB [id number] endoscope was introduced through the anus and advanced to the cecum, which was identified by both the appendix and ileocecal valve. No adverse events experienced.   The quality of the prep was good.  (MiraLax was used)  The instrument was then slowly withdrawn as the colon was fully examined. Estimated blood loss is zero unless otherwise noted in this procedure report.      COLON FINDINGS: Multiple sessile polyps ranging from 3 to 25mm in size were found in the transverse colon(5), ascending colon, at the cecum, in the rectum, sigmoid colon, and descending colon (2). Polypectomies were performed with a cold snare.  The resection was complete, the polyp tissue was completely retrieved and sent to histology.   The examination was otherwise normal.  Retroflexed views revealed no  abnormalities. The time to cecum = 2.9 Withdrawal time = 19.8   The scope was withdrawn and the procedure completed. COMPLICATIONS: There were no immediate complications.  ENDOSCOPIC IMPRESSION: 1.   Multiple sessile polyps ranging from 3 to 64mm in size were found in the transverse colon, ascending colon, at the cecum, in the rectum, sigmoid colon, and descending colon; polypectomies were performed with a cold snare 2.   The examination was otherwise normal  RECOMMENDATIONS: Timing of repeat colonoscopy will be determined by pathology findings.  Hx multiple polyps over the years - may have an attenuated polyposis syndrome  eSigned:  Gatha Mayer, MD, [facility] 05/29/2015 9:07 AM   cc: Dr. Kathryne Eriksson and The Patient   PATIENT NAME:  [name], [name] MR#: [id number]

## 2015-05-29 NOTE — Progress Notes (Signed)
Patient assists to bathroom to aide in expelling. Patient able to expell moderate amount of air. Ambulated in recovery with more air expelled. Patient stated i feel much better.

## 2015-05-29 NOTE — Progress Notes (Signed)
Called to room to assist during endoscopic procedure.  Patient ID and intended procedure confirmed with present staff. Received instructions for my participation in the procedure from the performing physician.  

## 2015-05-29 NOTE — Patient Instructions (Addendum)
I found and removed 11 polyps today - all look benign. You also have a condition called diverticulosis - common and not usually a problem. Please read the handout provided.  I will let you know pathology results and when to have another routine colonoscopy by mail.  I appreciate the opportunity to care for you. Gatha Mayer, MD, [facility]  Discharge instructions given. Handouts on polyps and diverticulosis. Resume previous medications. YOU HAD AN ENDOSCOPIC PROCEDURE TODAY AT [facility]:   Refer to the procedure report that was given to you for any specific questions about what was found during the examination.  If the procedure report does not answer your questions, please call your gastroenterologist to clarify.  If you requested that your care partner not be given the details of your procedure findings, then the procedure report has been included in a sealed envelope for you to review at your convenience later.  YOU SHOULD EXPECT: Some feelings of bloating in the abdomen. Passage of more gas than usual.  Walking can help get rid of the air that was put into your GI tract during the procedure and reduce the bloating. If you had a lower endoscopy (such as a colonoscopy or flexible sigmoidoscopy) you may notice spotting of blood in your stool or on the toilet paper. If you underwent a bowel prep for your procedure, you may not have a normal bowel movement for a few days.  Please Note:  You might notice some irritation and congestion in your nose or some drainage.  This is from the oxygen used during your procedure.  There is no need for concern and it should clear up in a day or so.  SYMPTOMS TO REPORT IMMEDIATELY:   Following lower endoscopy (colonoscopy or flexible sigmoidoscopy):  Excessive amounts of blood in the stool  Significant tenderness or worsening of abdominal pains  Swelling of the abdomen that is new, acute  Fever of 100F or higher   For urgent or  emergent issues, a gastroenterologist can be reached at any hour by calling [phone].   DIET: Your first meal following the procedure should be a small meal and then it is ok to progress to your normal diet. Heavy or fried foods are harder to digest and may make you feel nauseous or bloated.  Likewise, meals heavy in dairy and vegetables can increase bloating.  Drink plenty of fluids but you should avoid alcoholic beverages for 24 hours.  ACTIVITY:  You should plan to take it easy for the rest of today and you should NOT DRIVE or use heavy machinery until tomorrow (because of the sedation medicines used during the test).    FOLLOW UP: Our staff will call the number listed on your records the next business day following your procedure to check on you and address any questions or concerns that you may have regarding the information given to you following your procedure. If we do not reach you, we will leave a message.  However, if you are feeling well and you are not experiencing any problems, there is no need to return our call.  We will assume that you have returned to your regular daily activities without incident.  If any biopsies were taken you will be contacted by phone or by letter within the next 1-3 weeks.  Please call us at [phone] if you have not heard about the biopsies in 3 weeks.    SIGNATURES/CONFIDENTIALITY: You and/or your care partner have signed  paperwork which will be entered into your electronic medical record.  These signatures attest to the fact that that the information above on your After Visit Summary has been reviewed and is understood.  Full responsibility of the confidentiality of this discharge information lies with you and/or your care-partner.

## 2015-05-30 ENCOUNTER — Telehealth: Payer: Self-pay | Admitting: *Deleted

## 2015-05-30 NOTE — Telephone Encounter (Signed)
  Follow up Call-  Call back number 05/29/2015  Post procedure Call Back phone  # [phone]  Permission to leave phone message Yes     Patient questions:  Message left to call us if necessary.

## 2015-06-10 ENCOUNTER — Encounter: Payer: Self-pay | Admitting: Internal Medicine

## 2015-08-29 ENCOUNTER — Encounter: Payer: Self-pay | Admitting: Internal Medicine

## 2015-09-10 ENCOUNTER — Telehealth: Payer: Self-pay

## 2015-09-10 DIAGNOSIS — I83023 Varicose veins of left lower extremity with ulcer of ankle: Secondary | ICD-10-CM

## 2015-09-10 DIAGNOSIS — L97329 Non-pressure chronic ulcer of left ankle with unspecified severity: Principal | ICD-10-CM

## 2015-09-10 NOTE — Telephone Encounter (Signed)
Discussed pt's symptoms with L. Melvyn Novas, Therapist, sports.  Will schedule pt. For a left LE venous reflux study and office appt.  Pt. will be contacted.

## 2015-09-10 NOTE — Telephone Encounter (Signed)
Phone call from pt.  Requested appt. with Dr. Donnetta Hutching.  Reported 1 wk. hx of dime-size sore on left ankle, that has a scab over it.  Denied any redness, warmth, or drainage.  Denied any swelling of left LE.  Denied fever/ chills.   Denied any concerns or problems with right LE.   Advised will return call with appt. info. with Dr. Donnetta Hutching; agreed w/ plan.

## 2015-09-10 NOTE — Telephone Encounter (Signed)
Spoke with Shanon Brow to schedule appointments, dpm

## 2015-09-18 ENCOUNTER — Ambulatory Visit (HOSPITAL_COMMUNITY)
Admission: RE | Admit: 2015-09-18 | Discharge: 2015-09-18 | Disposition: A | Discharge status: Home / Self Care | Payer: 59 | Source: Ambulatory Visit | Attending: Vascular Surgery | Admitting: Vascular Surgery

## 2015-09-18 DIAGNOSIS — E785 Hyperlipidemia, unspecified: Secondary | ICD-10-CM | POA: Diagnosis not present

## 2015-09-18 DIAGNOSIS — I10 Essential (primary) hypertension: Secondary | ICD-10-CM | POA: Diagnosis not present

## 2015-09-18 DIAGNOSIS — I83023 Varicose veins of left lower extremity with ulcer of ankle: Secondary | ICD-10-CM

## 2015-09-18 DIAGNOSIS — I82592 Chronic embolism and thrombosis of other specified deep vein of left lower extremity: Secondary | ICD-10-CM | POA: Diagnosis not present

## 2015-09-18 DIAGNOSIS — L97329 Non-pressure chronic ulcer of left ankle with unspecified severity: Secondary | ICD-10-CM

## 2015-09-19 ENCOUNTER — Encounter: Payer: Self-pay | Admitting: Vascular Surgery

## 2015-09-23 ENCOUNTER — Encounter: Payer: Self-pay | Admitting: Vascular Surgery

## 2015-09-23 ENCOUNTER — Ambulatory Visit (INDEPENDENT_AMBULATORY_CARE_PROVIDER_SITE_OTHER): Payer: 59 | Admitting: Vascular Surgery

## 2015-09-23 VITALS — Ht 74.5 in | Wt 250.0 lb

## 2015-09-23 DIAGNOSIS — T8189XD Other complications of procedures, not elsewhere classified, subsequent encounter: Secondary | ICD-10-CM | POA: Diagnosis not present

## 2015-09-23 MED ORDER — SILVER SULFADIAZINE 1 % EX CREA: 1.0000 "application " | TOPICAL_CREAM | Freq: Every day | CUTANEOUS | Status: DC
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
## 2015-10-07 ENCOUNTER — Ambulatory Visit (INDEPENDENT_AMBULATORY_CARE_PROVIDER_SITE_OTHER): Payer: 59 | Admitting: Podiatry

## 2015-10-07 ENCOUNTER — Encounter: Payer: Self-pay | Admitting: Podiatry

## 2015-10-07 DIAGNOSIS — L84 Corns and callosities: Secondary | ICD-10-CM | POA: Diagnosis not present

## 2015-10-07 NOTE — Progress Notes (Signed)
Patient ID: Trevor [name], male   DOB: 11-Jan-1951, 65 y.o.   MRN: [id number]  Subjective: This patient presents today complaining of callused areas on his right foot and requests debridement of calluses  Objective: Orientated 3 Hemorrhagic callus distal medial second right toe that remains closed after debridement Callus medial right hallux Callus medial plantar right first MPJ HAV and hallux interphalangeus right  Assessment: Keratoses 3 Friction of hallux against second toe right  Plan: Debride calluses 3 Dispensed foam sleeves were second right toe and recommended patient continue do this until toe was asymptomatic  Reappoint at patient's request :

## 2015-10-07 NOTE — Patient Instructions (Signed)
The right great toe is rubbing against the second right toe causing friction and an inflamed corn I suggest wearing a foam toe sleeve on the second toe until the toe feels comfortable

## 2015-10-28 ENCOUNTER — Encounter: Payer: Self-pay | Admitting: Vascular Surgery

## 2015-11-04 ENCOUNTER — Ambulatory Visit (INDEPENDENT_AMBULATORY_CARE_PROVIDER_SITE_OTHER): Payer: 59 | Admitting: Vascular Surgery

## 2015-11-04 ENCOUNTER — Encounter: Payer: Self-pay | Admitting: Vascular Surgery

## 2015-11-04 VITALS — BP 171/87 | HR 68 | Temp 98.2°F | Resp 18 | Ht 74.5 in | Wt 250.3 lb

## 2015-11-04 DIAGNOSIS — I83029 Varicose veins of left lower extremity with ulcer of unspecified site: Secondary | ICD-10-CM

## 2015-11-04 DIAGNOSIS — L97329 Non-pressure chronic ulcer of left ankle with unspecified severity: Secondary | ICD-10-CM

## 2015-11-04 DIAGNOSIS — I83023 Varicose veins of left lower extremity with ulcer of ankle: Secondary | ICD-10-CM

## 2015-11-04 DIAGNOSIS — L97929 Non-pressure chronic ulcer of unspecified part of left lower leg with unspecified severity: Principal | ICD-10-CM

## 2015-11-04 MED ORDER — SILVER SULFADIAZINE 1 % EX CREA: 1.0000 "application " | TOPICAL_CREAM | Freq: Every day | CUTANEOUS | Status: DC

## 2015-11-04 NOTE — Progress Notes (Signed)
Filed Vitals:   11/04/15 1547 11/04/15 1551  BP: 170/97 171/87  Pulse: 68   Temp: 98.2 F (36.8 C)   TempSrc: Oral   Resp: 18   Height: 6' 2.5" (1.892 m)   Weight: 250 lb 4.8 oz (113.535 kg)   SpO2: 98%
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
## 2015-11-14 DIAGNOSIS — M19071 Primary osteoarthritis, right ankle and foot: Secondary | ICD-10-CM

## 2015-11-14 HISTORY — DX: Primary osteoarthritis, right ankle and foot: M19.071

## 2015-11-25 DIAGNOSIS — E7800 Pure hypercholesterolemia, unspecified: Secondary | ICD-10-CM

## 2015-11-25 DIAGNOSIS — K5730 Diverticulosis of large intestine without perforation or abscess without bleeding: Secondary | ICD-10-CM

## 2015-11-25 DIAGNOSIS — F988 Other specified behavioral and emotional disorders with onset usually occurring in childhood and adolescence: Secondary | ICD-10-CM | POA: Insufficient documentation

## 2015-11-25 DIAGNOSIS — F329 Major depressive disorder, single episode, unspecified: Secondary | ICD-10-CM | POA: Insufficient documentation

## 2015-11-25 DIAGNOSIS — F32A Depression, unspecified: Secondary | ICD-10-CM | POA: Insufficient documentation

## 2015-11-25 DIAGNOSIS — I10 Essential (primary) hypertension: Secondary | ICD-10-CM

## 2015-11-25 HISTORY — DX: Pure hypercholesterolemia, unspecified: E78.00

## 2015-11-25 HISTORY — DX: Diverticulosis of large intestine without perforation or abscess without bleeding: K57.30

## 2015-11-25 HISTORY — DX: Essential (primary) hypertension: I10

## 2015-12-31 DIAGNOSIS — M2550 Pain in unspecified joint: Secondary | ICD-10-CM

## 2015-12-31 DIAGNOSIS — R351 Nocturia: Secondary | ICD-10-CM

## 2015-12-31 DIAGNOSIS — R41844 Frontal lobe and executive function deficit: Secondary | ICD-10-CM

## 2015-12-31 HISTORY — DX: Frontal lobe and executive function deficit: R41.844

## 2015-12-31 HISTORY — DX: Nocturia: R35.1

## 2015-12-31 HISTORY — DX: Pain in unspecified joint: M25.50

## 2016-01-08 ENCOUNTER — Encounter: Payer: Self-pay | Admitting: Vascular Surgery

## 2016-01-13 ENCOUNTER — Encounter: Payer: Self-pay | Admitting: Vascular Surgery

## 2016-01-13 ENCOUNTER — Ambulatory Visit (INDEPENDENT_AMBULATORY_CARE_PROVIDER_SITE_OTHER): Payer: 59 | Admitting: Vascular Surgery

## 2016-01-13 VITALS — BP 140/77 | HR 74 | Temp 97.9°F | Resp 18 | Ht 76.0 in | Wt 245.0 lb

## 2016-01-13 DIAGNOSIS — L97929 Non-pressure chronic ulcer of unspecified part of left lower leg with unspecified severity: Principal | ICD-10-CM

## 2016-01-13 DIAGNOSIS — I83029 Varicose veins of left lower extremity with ulcer of unspecified site: Secondary | ICD-10-CM
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
## 2016-01-27 DIAGNOSIS — M79672 Pain in left foot: Secondary | ICD-10-CM

## 2016-01-27 HISTORY — DX: Pain in left foot: M79.672

## 2016-01-29 DIAGNOSIS — I83029 Varicose veins of left lower extremity with ulcer of unspecified site: Secondary | ICD-10-CM

## 2016-02-18 NOTE — Addendum Note (Signed)
Addended by: Thresa Ross C on: 02/18/2016 11:33 AM   Modules accepted: Orders

## 2016-03-03 ENCOUNTER — Encounter: Payer: Self-pay | Admitting: Vascular Surgery

## 2016-03-09 ENCOUNTER — Encounter: Payer: Self-pay | Admitting: Vascular Surgery

## 2016-03-09 ENCOUNTER — Ambulatory Visit (INDEPENDENT_AMBULATORY_CARE_PROVIDER_SITE_OTHER): Payer: 59 | Admitting: Vascular Surgery

## 2016-03-09 VITALS — BP 118/82 | HR 70 | Temp 97.3°F | Resp 18 | Ht 76.0 in | Wt 242.2 lb

## 2016-03-09 DIAGNOSIS — I83023 Varicose veins of left lower extremity with ulcer of ankle: Secondary | ICD-10-CM

## 2016-03-09 DIAGNOSIS — IMO0001 Reserved for inherently not codable concepts without codable children: Secondary | ICD-10-CM

## 2016-03-09 DIAGNOSIS — I83019 Varicose veins of right lower extremity with ulcer of unspecified site: Secondary | ICD-10-CM | POA: Diagnosis not present

## 2016-03-09 DIAGNOSIS — L97329 Non-pressure chronic ulcer of left ankle with unspecified severity: Principal | ICD-10-CM

## 2016-03-09 DIAGNOSIS — I868 Varicose veins of other specified sites: Secondary | ICD-10-CM
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
## 2016-04-08 DIAGNOSIS — M25571 Pain in right ankle and joints of right foot: Secondary | ICD-10-CM

## 2016-04-08 DIAGNOSIS — M79671 Pain in right foot: Secondary | ICD-10-CM

## 2016-04-08 DIAGNOSIS — G8929 Other chronic pain: Secondary | ICD-10-CM | POA: Insufficient documentation

## 2016-04-08 HISTORY — DX: Pain in right foot: M79.671

## 2016-04-08 HISTORY — DX: Other chronic pain: G89.29

## 2016-04-29 DIAGNOSIS — M19079 Primary osteoarthritis, unspecified ankle and foot: Secondary | ICD-10-CM

## 2016-04-29 HISTORY — DX: Primary osteoarthritis, unspecified ankle and foot: M19.079

## 2016-05-20 ENCOUNTER — Encounter: Payer: Self-pay | Admitting: Vascular Surgery

## 2016-05-25 ENCOUNTER — Encounter: Payer: Self-pay | Admitting: Vascular Surgery

## 2016-05-25 ENCOUNTER — Ambulatory Visit (INDEPENDENT_AMBULATORY_CARE_PROVIDER_SITE_OTHER): Payer: 59 | Admitting: Vascular Surgery

## 2016-05-25 ENCOUNTER — Ambulatory Visit (HOSPITAL_COMMUNITY)
Admission: RE | Admit: 2016-05-25 | Discharge: 2016-05-25 | Disposition: A | Discharge status: Home / Self Care | Payer: 59 | Source: Ambulatory Visit | Attending: Vascular Surgery | Admitting: Vascular Surgery

## 2016-05-25 VITALS — BP 119/80 | HR 80 | Temp 97.8°F | Resp 18 | Ht 76.0 in | Wt 241.6 lb

## 2016-05-25 DIAGNOSIS — L97329 Non-pressure chronic ulcer of left ankle with unspecified severity: Principal | ICD-10-CM

## 2016-05-25 DIAGNOSIS — I8391 Asymptomatic varicose veins of right lower extremity: Secondary | ICD-10-CM | POA: Diagnosis not present

## 2016-05-25 DIAGNOSIS — I83029 Varicose veins of left lower extremity with ulcer of unspecified site: Secondary | ICD-10-CM

## 2016-05-25 DIAGNOSIS — I83023 Varicose veins of left lower extremity with ulcer of ankle: Secondary | ICD-10-CM | POA: Diagnosis not present

## 2016-05-25 DIAGNOSIS — L97929 Non-pressure chronic ulcer of unspecified part of left lower leg with unspecified severity: Secondary | ICD-10-CM

## 2016-05-25 DIAGNOSIS — R609 Edema, unspecified: Secondary | ICD-10-CM | POA: Diagnosis present
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
## 2016-06-23 DIAGNOSIS — Z9889 Other specified postprocedural states: Secondary | ICD-10-CM | POA: Insufficient documentation

## 2016-06-23 HISTORY — DX: Other specified postprocedural states: Z98.890

## 2016-09-07 DIAGNOSIS — S29011A Strain of muscle and tendon of front wall of thorax, initial encounter: Secondary | ICD-10-CM

## 2016-09-07 HISTORY — DX: Strain of muscle and tendon of front wall of thorax, initial encounter: S29.011A

## 2016-12-09 DIAGNOSIS — M19079 Primary osteoarthritis, unspecified ankle and foot: Secondary | ICD-10-CM | POA: Diagnosis not present

## 2016-12-09 DIAGNOSIS — Z981 Arthrodesis status: Secondary | ICD-10-CM | POA: Diagnosis not present

## 2016-12-09 DIAGNOSIS — M205X1 Other deformities of toe(s) (acquired), right foot: Secondary | ICD-10-CM | POA: Diagnosis not present

## 2016-12-09 DIAGNOSIS — M19071 Primary osteoarthritis, right ankle and foot: Secondary | ICD-10-CM | POA: Diagnosis not present

## 2016-12-09 DIAGNOSIS — M7731 Calcaneal spur, right foot: Secondary | ICD-10-CM | POA: Diagnosis not present

## 2016-12-09 DIAGNOSIS — Z79899 Other long term (current) drug therapy: Secondary | ICD-10-CM | POA: Diagnosis not present

## 2016-12-09 DIAGNOSIS — Z96651 Presence of right artificial knee joint: Secondary | ICD-10-CM | POA: Diagnosis not present

## 2016-12-09 DIAGNOSIS — Z09 Encounter for follow-up examination after completed treatment for conditions other than malignant neoplasm: Secondary | ICD-10-CM | POA: Diagnosis not present

## 2016-12-09 DIAGNOSIS — Z8781 Personal history of (healed) traumatic fracture: Secondary | ICD-10-CM | POA: Diagnosis not present

## 2016-12-09 DIAGNOSIS — I10 Essential (primary) hypertension: Secondary | ICD-10-CM | POA: Diagnosis not present

## 2016-12-09 DIAGNOSIS — Z888 Allergy status to other drugs, medicaments and biological substances status: Secondary | ICD-10-CM | POA: Diagnosis not present

## 2016-12-09 DIAGNOSIS — M85871 Other specified disorders of bone density and structure, right ankle and foot: Secondary | ICD-10-CM | POA: Diagnosis not present

## 2016-12-09 DIAGNOSIS — Z87891 Personal history of nicotine dependence: Secondary | ICD-10-CM | POA: Diagnosis not present

## 2017-02-16 DIAGNOSIS — M7021 Olecranon bursitis, right elbow: Secondary | ICD-10-CM | POA: Diagnosis not present

## 2017-02-16 DIAGNOSIS — M7701 Medial epicondylitis, right elbow: Secondary | ICD-10-CM | POA: Diagnosis not present

## 2017-02-28 DIAGNOSIS — I10 Essential (primary) hypertension: Secondary | ICD-10-CM | POA: Diagnosis not present

## 2017-02-28 DIAGNOSIS — E7800 Pure hypercholesterolemia, unspecified: Secondary | ICD-10-CM | POA: Diagnosis not present

## 2017-03-03 DIAGNOSIS — R0789 Other chest pain: Secondary | ICD-10-CM | POA: Diagnosis not present

## 2017-03-18 DIAGNOSIS — R058 Other specified cough: Secondary | ICD-10-CM

## 2017-03-18 DIAGNOSIS — I10 Essential (primary) hypertension: Secondary | ICD-10-CM | POA: Diagnosis not present

## 2017-03-18 DIAGNOSIS — R0789 Other chest pain: Secondary | ICD-10-CM | POA: Diagnosis not present

## 2017-03-18 DIAGNOSIS — R079 Chest pain, unspecified: Secondary | ICD-10-CM | POA: Diagnosis not present

## 2017-03-18 DIAGNOSIS — M7581 Other shoulder lesions, right shoulder: Secondary | ICD-10-CM | POA: Insufficient documentation

## 2017-03-18 DIAGNOSIS — T464X5A Adverse effect of angiotensin-converting-enzyme inhibitors, initial encounter: Secondary | ICD-10-CM | POA: Insufficient documentation

## 2017-03-18 DIAGNOSIS — R05 Cough: Secondary | ICD-10-CM | POA: Insufficient documentation

## 2017-03-18 HISTORY — DX: Other specified cough: R05.8

## 2017-03-18 HISTORY — DX: Other shoulder lesions, right shoulder: M75.81

## 2017-03-18 HISTORY — DX: Adverse effect of angiotensin-converting-enzyme inhibitors, initial encounter: T46.4X5A

## 2017-03-28 ENCOUNTER — Other Ambulatory Visit: Payer: Self-pay | Admitting: Family Medicine

## 2017-03-28 DIAGNOSIS — R9389 Abnormal findings on diagnostic imaging of other specified body structures: Secondary | ICD-10-CM

## 2017-04-04 ENCOUNTER — Ambulatory Visit
Admission: RE | Admit: 2017-04-04 | Discharge: 2017-04-04 | Disposition: A | Discharge status: Home / Self Care | Payer: PPO | Source: Ambulatory Visit | Attending: Family Medicine | Admitting: Family Medicine

## 2017-04-04 DIAGNOSIS — R0600 Dyspnea, unspecified: Secondary | ICD-10-CM

## 2017-04-04 DIAGNOSIS — R911 Solitary pulmonary nodule: Secondary | ICD-10-CM | POA: Diagnosis not present

## 2017-04-04 DIAGNOSIS — I7123 Aneurysm of the descending thoracic aorta, without rupture: Secondary | ICD-10-CM

## 2017-04-04 DIAGNOSIS — M25512 Pain in left shoulder: Secondary | ICD-10-CM

## 2017-04-04 DIAGNOSIS — I712 Thoracic aortic aneurysm, without rupture: Secondary | ICD-10-CM

## 2017-04-04 DIAGNOSIS — R9389 Abnormal findings on diagnostic imaging of other specified body structures: Secondary | ICD-10-CM

## 2017-04-04 DIAGNOSIS — R0609 Other forms of dyspnea: Secondary | ICD-10-CM | POA: Insufficient documentation

## 2017-04-04 HISTORY — DX: Thoracic aortic aneurysm, without rupture: I71.2

## 2017-04-04 HISTORY — DX: Aneurysm of the descending thoracic aorta, without rupture: I71.23

## 2017-04-04 HISTORY — DX: Dyspnea, unspecified: R06.00

## 2017-04-04 HISTORY — DX: Other forms of dyspnea: R06.09

## 2017-04-04 HISTORY — DX: Pain in left shoulder: M25.512

## 2017-04-04 MED ORDER — IOPAMIDOL (ISOVUE-300) INJECTION 61%
75.0000 mL | Freq: Once | INTRAVENOUS | Status: AC | PRN
  Administered 2017-04-04: 75 mL via INTRAVENOUS

## 2017-04-11 ENCOUNTER — Encounter: Payer: Self-pay | Admitting: Vascular Surgery

## 2017-04-12 ENCOUNTER — Encounter: Payer: Self-pay | Admitting: Vascular Surgery

## 2017-04-12 ENCOUNTER — Ambulatory Visit (INDEPENDENT_AMBULATORY_CARE_PROVIDER_SITE_OTHER): Payer: PPO | Admitting: Vascular Surgery

## 2017-04-12 VITALS — BP 118/78 | HR 74 | Temp 98.3°F | Resp 16 | Ht 76.0 in | Wt 249.0 lb

## 2017-04-12 DIAGNOSIS — I712 Thoracic aortic aneurysm, without rupture, unspecified: Secondary | ICD-10-CM
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
## 2017-04-21 DIAGNOSIS — I10 Essential (primary) hypertension: Secondary | ICD-10-CM | POA: Diagnosis not present

## 2017-04-21 DIAGNOSIS — R41844 Frontal lobe and executive function deficit: Secondary | ICD-10-CM | POA: Diagnosis not present

## 2017-04-21 DIAGNOSIS — E7800 Pure hypercholesterolemia, unspecified: Secondary | ICD-10-CM | POA: Diagnosis not present

## 2017-05-09 DIAGNOSIS — M25511 Pain in right shoulder: Secondary | ICD-10-CM | POA: Diagnosis not present

## 2017-05-09 DIAGNOSIS — M25421 Effusion, right elbow: Secondary | ICD-10-CM | POA: Diagnosis not present

## 2017-05-18 ENCOUNTER — Other Ambulatory Visit: Payer: Self-pay

## 2017-05-18 ENCOUNTER — Encounter: Payer: Self-pay | Admitting: Vascular Surgery

## 2017-05-23 ENCOUNTER — Encounter: Payer: PPO | Admitting: Surgery

## 2017-06-02 ENCOUNTER — Encounter (HOSPITAL_COMMUNITY): Payer: Self-pay

## 2017-06-02 NOTE — Pre-Procedure Instructions (Signed)
LAKER THOMPSON  06/02/2017      [id number] - [place], [place] - [address]. AT [address] [place] [address] Phone: [phone] Fax: [phone]  [facility], [address] AT [address] [place] [address] Phone: [phone] Fax: [phone]    Your procedure is scheduled on Wednesday, 06/08/2017.  Report to [facility] Admitting at [place].M.  Call this number if you have problems the morning of surgery:  [phone]   Remember:  Do not eat food or drink liquids after midnight.  Continue all other medications as directed by your physician except follow these medication instructions before surgery   Take these medicines the morning of surgery with A SIP OF WATER: Amlodipine (Norvasc) Bupropion (Wellbutrin SR)  7 days prior to surgery STOP taking any Aspirin, Aleve, Naproxen, Ibuprofen, Motrin, Advil, Goody's, BC's, all herbal medications, fish oil, and all vitamins - including your Meloxicam (Mobic)     Do not wear jewelry  Do not wear lotions, powders, or colognes, or deodorant.  Men may shave face and neck.  Do not bring valuables to the hospital.  [facility] is not responsible for any belongings or valuables.  Contacts, eyeglasses, dentures or bridgework may not be worn into surgery.  Leave your suitcase in the car.  After surgery it may be brought to your room.  For patients admitted to the hospital, discharge time will be determined by your treatment team.  Patients discharged the day of surgery will not be allowed to drive home.   Name and phone number of your driver:    Special instructions:   [place]- Preparing For Surgery  Before surgery, you can play an important role. Because skin is not sterile, your skin needs to be as free of germs as possible. You can reduce the number of germs on your skin  by washing with CHG (chlorahexidine gluconate) Soap before surgery.  CHG is an antiseptic cleaner which kills germs and bonds with the skin to continue killing germs even after washing.  Please do not use if you have an allergy to CHG or antibacterial soaps. If your skin becomes reddened/irritated stop using the CHG.  Do not shave (including legs and underarms) for at least 48 hours prior to first CHG shower. It is OK to shave your face.  Please follow these instructions carefully.   1. Shower the NIGHT BEFORE SURGERY and the MORNING OF SURGERY with CHG.   2. If you chose to wash your hair, wash your hair first as usual with your normal shampoo.  3. After you shampoo, rinse your hair and body thoroughly to remove the shampoo.  4. Use CHG as you would any other liquid soap. You can apply CHG directly to the skin and wash gently with a scrungie or a clean washcloth.   5. Apply the CHG Soap to your body ONLY FROM THE NECK DOWN.  Do not use on open wounds or open sores. Avoid contact with your eyes, ears, mouth and genitals (private parts). Wash genitals (private parts) with your normal soap.  6. Wash thoroughly, paying special attention to the area where your surgery will be performed.  7. Thoroughly rinse your body with warm water from the neck down.  8. DO NOT shower/wash with your normal soap after using and rinsing off the CHG Soap.  9. Pat yourself dry  with a CLEAN TOWEL.   10. Wear CLEAN PAJAMAS   11. Place CLEAN SHEETS on your bed the night of your first shower and DO NOT SLEEP WITH PETS.    Day of Surgery: Shower as stated above. Do not apply any deodorants/lotions.  Please wear clean clothes to the hospital/surgery center.      Please read over the following fact sheets that you were given. Pain Booklet, Coughing and Deep Breathing, MRSA Information and Surgical Site Infection Prevention

## 2017-06-03 ENCOUNTER — Encounter (HOSPITAL_COMMUNITY)
Admission: RE | Admit: 2017-06-03 | Discharge: 2017-06-03 | Disposition: A | Discharge status: Home / Self Care | Payer: PPO | Source: Ambulatory Visit | Attending: Vascular Surgery | Admitting: Vascular Surgery

## 2017-06-03 ENCOUNTER — Encounter (HOSPITAL_COMMUNITY): Payer: Self-pay

## 2017-06-03 DIAGNOSIS — Z7952 Long term (current) use of systemic steroids: Secondary | ICD-10-CM | POA: Diagnosis not present

## 2017-06-03 DIAGNOSIS — Z01812 Encounter for preprocedural laboratory examination: Secondary | ICD-10-CM | POA: Diagnosis not present

## 2017-06-03 DIAGNOSIS — I10 Essential (primary) hypertension: Secondary | ICD-10-CM | POA: Insufficient documentation

## 2017-06-03 DIAGNOSIS — Z79899 Other long term (current) drug therapy: Secondary | ICD-10-CM | POA: Diagnosis not present

## 2017-06-03 DIAGNOSIS — F988 Other specified behavioral and emotional disorders with onset usually occurring in childhood and adolescence: Secondary | ICD-10-CM | POA: Insufficient documentation

## 2017-06-03 DIAGNOSIS — E785 Hyperlipidemia, unspecified: Secondary | ICD-10-CM | POA: Diagnosis not present

## 2017-06-03 HISTORY — DX: Disease of pericardium, unspecified: I31.9

## 2017-06-03 HISTORY — DX: Major depressive disorder, single episode, unspecified: F32.9

## 2017-06-03 HISTORY — DX: Depression, unspecified: F32.A

## 2017-06-03 HISTORY — DX: Pneumonia, unspecified organism: J18.9

## 2017-06-03 LAB — SURGICAL PCR SCREEN
MRSA, PCR: NEGATIVE
STAPHYLOCOCCUS AUREUS: NEGATIVE

## 2017-06-03 LAB — BLOOD GAS, ARTERIAL
ACID-BASE DEFICIT: 0.2 mmol/L (ref 0.0–2.0)
BICARBONATE: 23.1 mmol/L (ref 20.0–28.0)
DRAWN BY: 257081
FIO2: 21
O2 SAT: 98.3 %
PATIENT TEMPERATURE: 98.6
pCO2 arterial: 32.6 mmHg (ref 32.0–48.0)
pH, Arterial: 7.465 — ABNORMAL HIGH (ref 7.350–7.450)
pO2, Arterial: 109 mmHg — ABNORMAL HIGH (ref 83.0–108.0)

## 2017-06-03 LAB — CBC
HEMATOCRIT: 37.7 % — AB (ref 39.0–52.0)
Hemoglobin: 12.1 g/dL — ABNORMAL LOW (ref 13.0–17.0)
MCH: 30.6 pg (ref 26.0–34.0)
MCHC: 32.1 g/dL (ref 30.0–36.0)
MCV: 95.2 fL (ref 78.0–100.0)
Platelets: 213 10*3/uL (ref 150–400)
RBC: 3.96 MIL/uL — ABNORMAL LOW (ref 4.22–5.81)
RDW: 13.7 % (ref 11.5–15.5)
WBC: 8.1 10*3/uL (ref 4.0–10.5)

## 2017-06-03 LAB — URINALYSIS, ROUTINE W REFLEX MICROSCOPIC
BILIRUBIN URINE: NEGATIVE
Glucose, UA: NEGATIVE mg/dL
Hgb urine dipstick: NEGATIVE
KETONES UR: NEGATIVE mg/dL
Leukocytes, UA: NEGATIVE
Nitrite: NEGATIVE
PH: 5 (ref 5.0–8.0)
Protein, ur: NEGATIVE mg/dL
SPECIFIC GRAVITY, URINE: 1.024 (ref 1.005–1.030)

## 2017-06-03 LAB — COMPREHENSIVE METABOLIC PANEL
ALBUMIN: 3.8 g/dL (ref 3.5–5.0)
ALT: 21 U/L (ref 17–63)
ANION GAP: 8 (ref 5–15)
AST: 25 U/L (ref 15–41)
Alkaline Phosphatase: 71 U/L (ref 38–126)
BILIRUBIN TOTAL: 1.1 mg/dL (ref 0.3–1.2)
BUN: 21 mg/dL — ABNORMAL HIGH (ref 6–20)
CHLORIDE: 109 mmol/L (ref 101–111)
CO2: 22 mmol/L (ref 22–32)
Calcium: 9.2 mg/dL (ref 8.9–10.3)
Creatinine, Ser: 1.4 mg/dL — ABNORMAL HIGH (ref 0.61–1.24)
GFR calc Af Amer: 60 mL/min — ABNORMAL LOW (ref >60)
GFR calc non Af Amer: 52 mL/min — ABNORMAL LOW (ref >60)
GLUCOSE: 123 mg/dL — AB (ref 65–99)
POTASSIUM: 3.9 mmol/L (ref 3.5–5.1)
Sodium: 139 mmol/L (ref 135–145)
TOTAL PROTEIN: 6.5 g/dL (ref 6.5–8.1)

## 2017-06-03 LAB — ABO/RH: ABO/RH(D): O POS

## 2017-06-03 LAB — PROTIME-INR
INR: 0.94
Prothrombin Time: 12.4 seconds (ref 11.4–15.2)

## 2017-06-03 LAB — APTT: APTT: 31 s (ref 24–36)

## 2017-06-03 LAB — PREPARE RBC (CROSSMATCH)

## 2017-06-03 NOTE — Progress Notes (Signed)
PCP is Dr. Kathryne Eriksson Cardiologist is Dr. Romeo Apple Denies ever having a Card cath Echo and stress test noted form 2011. Denies any chest pain, cough, or fever.  Request sent for EKG tracing.

## 2017-06-03 NOTE — Progress Notes (Signed)
Lab called and states to release order for prepare 2 units of RBC. Order released as requested.

## 2017-06-06 NOTE — Progress Notes (Signed)
Anesthesia Chart Review:  Pt is a 66 year old male scheduled for thoracic aortic endovascular stent graft on 06/08/2017 with Curt Jews, MD  - PCP is Kathryne Eriksson, MD (notes in care everywhere)   PMH includes:  HTN, hyperlipidemia, TBI (1976), ADD. Former smoker. BMI 29  Medications include: amlodipine, adderall, losartan-hctz, lovastatin, predisone  BP 135/78   Pulse 85   Temp 36.7 C   Resp 20   Ht 6\' 4"  (1.93 m)   Wt 236 lb 3.2 oz (107.1 kg)   SpO2 96%   BMI 28.75 kg/m   Preoperative labs reviewed.    CT angio chest aorta 04/04/17:  - Coronary calcifications are noted suggesting coronary artery disease. - 5 mm nodule seen in right upper lobe. No follow-up needed if patient is low-risk. Non-contrast chest CT can be considered in 12 months if patient is high-risk.  - 4.5 cm saccular aneurysm with peripheral calcifications seen arising from anterior portion of descending thoracic aorta, which appears to be just distal to ductus arteriosus.   EKG 03/18/17: NSR  If no changes, I anticipate pt can proceed with surgery as scheduled.   Willeen Cass, FNP-BC [facility] Short Stay Surgical Center/Anesthesiology Phone: [phone] 06/06/2017 10:11 AM

## 2017-06-07 ENCOUNTER — Encounter: Payer: PPO | Admitting: Vascular Surgery

## 2017-06-07 NOTE — Anesthesia Preprocedure Evaluation (Addendum)
Anesthesia Evaluation  Patient identified by MRN, date of birth, ID band Patient awake    Reviewed: Allergy & Precautions, NPO status , Patient's Chart, lab work & pertinent test results  History of Anesthesia Complications Negative for: history of anesthetic complications  Airway Mallampati: I  TM Distance: >3 FB Neck ROM: Full    Dental  (+) Edentulous Upper, Upper Dentures, Lower Dentures, Partial Lower, Dental Advisory Given   Pulmonary former smoker,    breath sounds clear to auscultation       Cardiovascular hypertension, Pt. on medications + Peripheral Vascular Disease   Rhythm:Regular Rate:Normal     Neuro/Psych PSYCHIATRIC DISORDERS Depression TBI ADD negative neurological ROS     GI/Hepatic negative GI ROS, Neg liver ROS,   Endo/Other  negative endocrine ROS  Renal/GU negative Renal ROS     Musculoskeletal negative musculoskeletal ROS (+) Arthritis ,   Abdominal (+) + obese,  Abdomen: soft. Bowel sounds: normal.  Peds  (+) ATTENTION DEFICIT DISORDER WITHOUT HYPERACTIVITY Hematology negative hematology ROS (+)   Anesthesia Other Findings Day of surgery medications reviewed with the patient.  Reproductive/Obstetrics                            BP Readings from Last 3 Encounters:  06/08/17 (!) 160/94  06/03/17 135/78  04/12/17 118/78    Anesthesia Physical Anesthesia Plan  ASA: III  Anesthesia Plan: General   Post-op Pain Management:    Induction: Intravenous  PONV Risk Score and Plan: 2 and Ondansetron and Dexamethasone  Airway Management Planned: Oral ETT  Additional Equipment: Arterial line and CVP  Intra-op Plan:   Post-operative Plan: Extubation in OR  Informed Consent:   Plan Discussed with:   Anesthesia Plan Comments:         Anesthesia Quick Evaluation

## 2017-06-08 ENCOUNTER — Encounter (HOSPITAL_COMMUNITY): Payer: Self-pay | Admitting: *Deleted

## 2017-06-08 ENCOUNTER — Inpatient Hospital Stay (HOSPITAL_COMMUNITY): Payer: PPO | Admitting: Certified Registered Nurse Anesthetist

## 2017-06-08 ENCOUNTER — Inpatient Hospital Stay (HOSPITAL_COMMUNITY): Payer: PPO | Admitting: Emergency Medicine

## 2017-06-08 ENCOUNTER — Inpatient Hospital Stay (HOSPITAL_COMMUNITY)
Admission: RE | Admit: 2017-06-08 | Discharge: 2017-06-09 | DRG: 221 | Disposition: A | Discharge status: Home / Self Care | Payer: PPO | Source: Ambulatory Visit | Attending: Vascular Surgery | Admitting: Vascular Surgery

## 2017-06-08 ENCOUNTER — Inpatient Hospital Stay (HOSPITAL_COMMUNITY): Payer: PPO

## 2017-06-08 ENCOUNTER — Encounter (HOSPITAL_COMMUNITY)
Admission: RE | Disposition: A | Discharge status: Home / Self Care | Payer: Self-pay | Source: Ambulatory Visit | Attending: Vascular Surgery

## 2017-06-08 ENCOUNTER — Other Ambulatory Visit: Payer: Self-pay

## 2017-06-08 DIAGNOSIS — Z87820 Personal history of traumatic brain injury: Secondary | ICD-10-CM | POA: Diagnosis not present

## 2017-06-08 DIAGNOSIS — Z888 Allergy status to other drugs, medicaments and biological substances status: Secondary | ICD-10-CM

## 2017-06-08 DIAGNOSIS — I712 Thoracic aortic aneurysm, without rupture, unspecified: Secondary | ICD-10-CM

## 2017-06-08 DIAGNOSIS — Z800 Family history of malignant neoplasm of digestive organs: Secondary | ICD-10-CM | POA: Diagnosis not present

## 2017-06-08 DIAGNOSIS — Z87891 Personal history of nicotine dependence: Secondary | ICD-10-CM

## 2017-06-08 DIAGNOSIS — E785 Hyperlipidemia, unspecified: Secondary | ICD-10-CM | POA: Diagnosis present

## 2017-06-08 DIAGNOSIS — Z8679 Personal history of other diseases of the circulatory system: Secondary | ICD-10-CM

## 2017-06-08 DIAGNOSIS — F988 Other specified behavioral and emotional disorders with onset usually occurring in childhood and adolescence: Secondary | ICD-10-CM | POA: Diagnosis not present

## 2017-06-08 DIAGNOSIS — Z9889 Other specified postprocedural states: Secondary | ICD-10-CM

## 2017-06-08 DIAGNOSIS — I739 Peripheral vascular disease, unspecified: Secondary | ICD-10-CM | POA: Diagnosis not present

## 2017-06-08 DIAGNOSIS — Z791 Long term (current) use of non-steroidal anti-inflammatories (NSAID): Secondary | ICD-10-CM

## 2017-06-08 DIAGNOSIS — I10 Essential (primary) hypertension: Secondary | ICD-10-CM | POA: Diagnosis present

## 2017-06-08 DIAGNOSIS — Z86718 Personal history of other venous thrombosis and embolism: Secondary | ICD-10-CM

## 2017-06-08 DIAGNOSIS — M1990 Unspecified osteoarthritis, unspecified site: Secondary | ICD-10-CM | POA: Diagnosis not present

## 2017-06-08 HISTORY — PX: THORACIC AORTIC ENDOVASCULAR STENT GRAFT: SHX6112

## 2017-06-08 HISTORY — DX: Thoracic aortic aneurysm, without rupture, unspecified: I71.20

## 2017-06-08 HISTORY — DX: Thoracic aortic aneurysm, without rupture: I71.2

## 2017-06-08 LAB — CBC
HCT: 34.7 % — ABNORMAL LOW (ref 39.0–52.0)
Hemoglobin: 11.2 g/dL — ABNORMAL LOW (ref 13.0–17.0)
MCH: 31.3 pg (ref 26.0–34.0)
MCHC: 32.3 g/dL (ref 30.0–36.0)
MCV: 96.9 fL (ref 78.0–100.0)
Platelets: 201 10*3/uL (ref 150–400)
RBC: 3.58 MIL/uL — ABNORMAL LOW (ref 4.22–5.81)
RDW: 13.7 % (ref 11.5–15.5)
WBC: 10.4 10*3/uL (ref 4.0–10.5)

## 2017-06-08 LAB — BASIC METABOLIC PANEL
Anion gap: 6 (ref 5–15)
BUN: 23 mg/dL — AB (ref 6–20)
CHLORIDE: 109 mmol/L (ref 101–111)
CO2: 24 mmol/L (ref 22–32)
Calcium: 8.4 mg/dL — ABNORMAL LOW (ref 8.9–10.3)
Creatinine, Ser: 1.17 mg/dL (ref 0.61–1.24)
GFR calc Af Amer: >60 mL/min (ref >60)
GFR calc non Af Amer: >60 mL/min (ref >60)
Glucose, Bld: 135 mg/dL — ABNORMAL HIGH (ref 65–99)
POTASSIUM: 3.9 mmol/L (ref 3.5–5.1)
SODIUM: 139 mmol/L (ref 135–145)

## 2017-06-08 LAB — APTT: aPTT: 31 seconds (ref 24–36)

## 2017-06-08 LAB — PROTIME-INR
INR: 1.08
PROTHROMBIN TIME: 13.9 s (ref 11.4–15.2)

## 2017-06-08 LAB — MAGNESIUM: MAGNESIUM: 1.9 mg/dL (ref 1.7–2.4)

## 2017-06-08 SURGERY — INSERTION, ENDOVASCULAR STENT GRAFT, AORTA, THORACIC
Anesthesia: General

## 2017-06-08 MED ORDER — SUGAMMADEX SODIUM 200 MG/2ML IV SOLN
INTRAVENOUS | Status: AC
  Filled 2017-06-08: qty 2

## 2017-06-08 MED ORDER — HEPARIN SODIUM (PORCINE) 1000 UNIT/ML IJ SOLN
INTRAMUSCULAR | Status: DC | PRN
  Administered 2017-06-08: 8000 [IU] via INTRAVENOUS

## 2017-06-08 MED ORDER — LIDOCAINE 2% (20 MG/ML) 5 ML SYRINGE
INTRAMUSCULAR | Status: DC | PRN
  Administered 2017-06-08: 100 mg via INTRAVENOUS

## 2017-06-08 MED ORDER — HYDROMORPHONE HCL 1 MG/ML IJ SOLN
INTRAMUSCULAR | Status: AC
  Administered 2017-06-08: 0.5 mg via INTRAVENOUS
  Filled 2017-06-08: qty 1

## 2017-06-08 MED ORDER — PROTAMINE SULFATE 10 MG/ML IV SOLN
INTRAVENOUS | Status: DC | PRN
  Administered 2017-06-08: 10 mg via INTRAVENOUS
  Administered 2017-06-08: 20 mg via INTRAVENOUS
  Administered 2017-06-08 (×2): 10 mg via INTRAVENOUS

## 2017-06-08 MED ORDER — BISACODYL 10 MG RE SUPP: 10.0000 mg | Freq: Every day | RECTAL | Status: DC | PRN

## 2017-06-08 MED ORDER — OXYCODONE-ACETAMINOPHEN 5-325 MG PO TABS
1.0000 | ORAL_TABLET | ORAL | Status: DC | PRN
  Administered 2017-06-08: 2 via ORAL

## 2017-06-08 MED ORDER — 0.9 % SODIUM CHLORIDE (POUR BTL) OPTIME
TOPICAL | Status: DC | PRN
  Administered 2017-06-08: 1000 mL

## 2017-06-08 MED ORDER — MIDAZOLAM HCL 5 MG/5ML IJ SOLN
INTRAMUSCULAR | Status: DC | PRN
  Administered 2017-06-08: 2 mg via INTRAVENOUS

## 2017-06-08 MED ORDER — FENTANYL CITRATE (PF) 250 MCG/5ML IJ SOLN
INTRAMUSCULAR | Status: AC
  Filled 2017-06-08: qty 5

## 2017-06-08 MED ORDER — SODIUM CHLORIDE 0.9 % IJ SOLN
INTRAMUSCULAR | Status: AC
  Filled 2017-06-08: qty 10

## 2017-06-08 MED ORDER — SODIUM CHLORIDE 0.9 % IV SOLN: 500.0000 mL | Freq: Once | INTRAVENOUS | Status: DC | PRN

## 2017-06-08 MED ORDER — HYDROMORPHONE HCL 1 MG/ML IJ SOLN
0.2500 mg | INTRAMUSCULAR | Status: DC | PRN
  Administered 2017-06-08 (×4): 0.5 mg via INTRAVENOUS

## 2017-06-08 MED ORDER — PROMETHAZINE HCL 25 MG/ML IJ SOLN
6.2500 mg | INTRAMUSCULAR | Status: DC | PRN
  Administered 2017-06-08: 6.25 mg via INTRAVENOUS

## 2017-06-08 MED ORDER — PROMETHAZINE HCL 25 MG/ML IJ SOLN
INTRAMUSCULAR | Status: AC
  Filled 2017-06-08: qty 1

## 2017-06-08 MED ORDER — EPHEDRINE SULFATE 50 MG/ML IJ SOLN
INTRAMUSCULAR | Status: AC
  Filled 2017-06-08: qty 1

## 2017-06-08 MED ORDER — DEXAMETHASONE SODIUM PHOSPHATE 10 MG/ML IJ SOLN
INTRAMUSCULAR | Status: DC | PRN
  Administered 2017-06-08: 10 mg via INTRAVENOUS

## 2017-06-08 MED ORDER — PROPOFOL 10 MG/ML IV BOLUS
INTRAVENOUS | Status: AC
  Filled 2017-06-08: qty 20

## 2017-06-08 MED ORDER — ASPIRIN EC 81 MG PO TBEC: 81.0000 mg | DELAYED_RELEASE_TABLET | Freq: Every day | ORAL | Status: DC

## 2017-06-08 MED ORDER — SODIUM CHLORIDE 0.9 % IV SOLN
INTRAVENOUS | Status: DC | PRN
  Administered 2017-06-08 (×2)

## 2017-06-08 MED ORDER — ROCURONIUM BROMIDE 10 MG/ML (PF) SYRINGE
PREFILLED_SYRINGE | INTRAVENOUS | Status: AC
  Filled 2017-06-08: qty 5

## 2017-06-08 MED ORDER — HYDRALAZINE HCL 20 MG/ML IJ SOLN: 5.0000 mg | INTRAMUSCULAR | Status: DC | PRN

## 2017-06-08 MED ORDER — LABETALOL HCL 5 MG/ML IV SOLN: 10.0000 mg | INTRAVENOUS | Status: DC | PRN

## 2017-06-08 MED ORDER — PANTOPRAZOLE SODIUM 40 MG PO TBEC
40.0000 mg | DELAYED_RELEASE_TABLET | Freq: Every day | ORAL | Status: DC
  Administered 2017-06-08 – 2017-06-09 (×2): 40 mg via ORAL
  Filled 2017-06-08 (×2): qty 1

## 2017-06-08 MED ORDER — PREDNISONE 20 MG PO TABS
20.0000 mg | ORAL_TABLET | Freq: Two times a day (BID) | ORAL | Status: DC
  Administered 2017-06-08 – 2017-06-09 (×2): 20 mg via ORAL
  Filled 2017-06-08 (×2): qty 1

## 2017-06-08 MED ORDER — PREDNISONE 20 MG PO TABS: 20.0000 mg | ORAL_TABLET | Freq: Every day | ORAL | Status: DC

## 2017-06-08 MED ORDER — PHENOL 1.4 % MT LIQD
1.0000 | OROMUCOSAL | Status: DC | PRN
Start: 2017-06-08 — End: 2017-06-09

## 2017-06-08 MED ORDER — DEXTROSE 5 % IV SOLN
1.5000 g | Freq: Two times a day (BID) | INTRAVENOUS | Status: AC
  Administered 2017-06-08 – 2017-06-09 (×2): 1.5 g via INTRAVENOUS
  Filled 2017-06-08 (×2): qty 1.5

## 2017-06-08 MED ORDER — IODIXANOL 320 MG/ML IV SOLN
INTRAVENOUS | Status: DC | PRN
  Administered 2017-06-08: 40.1 mL via INTRAVENOUS

## 2017-06-08 MED ORDER — LACTATED RINGERS IV SOLN
INTRAVENOUS | Status: DC | PRN
  Administered 2017-06-08 (×2): via INTRAVENOUS

## 2017-06-08 MED ORDER — PROPOFOL 10 MG/ML IV BOLUS
INTRAVENOUS | Status: DC | PRN
  Administered 2017-06-08: 200 mg via INTRAVENOUS

## 2017-06-08 MED ORDER — DEXTROSE 5 % IV SOLN
1.5000 g | INTRAVENOUS | Status: AC
  Administered 2017-06-08: 1.5 g via INTRAVENOUS
  Filled 2017-06-08: qty 1.5

## 2017-06-08 MED ORDER — FENTANYL CITRATE (PF) 250 MCG/5ML IJ SOLN
INTRAMUSCULAR | Status: DC | PRN
  Administered 2017-06-08: 50 ug via INTRAVENOUS
  Administered 2017-06-08: 100 ug via INTRAVENOUS

## 2017-06-08 MED ORDER — CHLORHEXIDINE GLUCONATE 4 % EX LIQD: 60.0000 mL | Freq: Once | CUTANEOUS | Status: DC

## 2017-06-08 MED ORDER — AMPHETAMINE-DEXTROAMPHETAMINE 10 MG PO TABS
30.0000 mg | ORAL_TABLET | Freq: Two times a day (BID) | ORAL | Status: DC
  Administered 2017-06-08 – 2017-06-09 (×2): 30 mg via ORAL
  Filled 2017-06-08 (×2): qty 3

## 2017-06-08 MED ORDER — ONDANSETRON HCL 4 MG/2ML IJ SOLN
INTRAMUSCULAR | Status: DC | PRN
  Administered 2017-06-08: 4 mg via INTRAVENOUS

## 2017-06-08 MED ORDER — ASPIRIN EC 81 MG PO TBEC
81.0000 mg | DELAYED_RELEASE_TABLET | Freq: Every day | ORAL | Status: DC
  Administered 2017-06-09: 81 mg via ORAL
  Filled 2017-06-08: qty 1

## 2017-06-08 MED ORDER — MIDAZOLAM HCL 2 MG/2ML IJ SOLN
INTRAMUSCULAR | Status: AC
  Filled 2017-06-08: qty 2

## 2017-06-08 MED ORDER — BUPROPION HCL ER (SR) 100 MG PO TB12
200.0000 mg | ORAL_TABLET | Freq: Two times a day (BID) | ORAL | Status: DC
  Administered 2017-06-08 – 2017-06-09 (×2): 200 mg via ORAL
  Filled 2017-06-08 (×3): qty 2

## 2017-06-08 MED ORDER — DOCUSATE SODIUM 100 MG PO CAPS
100.0000 mg | ORAL_CAPSULE | Freq: Every day | ORAL | Status: DC
  Administered 2017-06-09: 100 mg via ORAL
  Filled 2017-06-08: qty 1

## 2017-06-08 MED ORDER — OXYCODONE-ACETAMINOPHEN 5-325 MG PO TABS
ORAL_TABLET | ORAL | Status: AC
  Administered 2017-06-08: 2 via ORAL
  Filled 2017-06-08: qty 2

## 2017-06-08 MED ORDER — ALUM & MAG HYDROXIDE-SIMETH 200-200-20 MG/5ML PO SUSP: 15.0000 mL | ORAL | Status: DC | PRN

## 2017-06-08 MED ORDER — DEXAMETHASONE SODIUM PHOSPHATE 10 MG/ML IJ SOLN
INTRAMUSCULAR | Status: AC
  Filled 2017-06-08: qty 1

## 2017-06-08 MED ORDER — MAGNESIUM SULFATE 2 GM/50ML IV SOLN
2.0000 g | Freq: Every day | INTRAVENOUS | Status: DC | PRN
  Filled 2017-06-08: qty 50

## 2017-06-08 MED ORDER — ACETAMINOPHEN 325 MG PO TABS: 325.0000 mg | ORAL_TABLET | ORAL | Status: DC | PRN

## 2017-06-08 MED ORDER — METOPROLOL TARTRATE 5 MG/5ML IV SOLN: 2.0000 mg | INTRAVENOUS | Status: DC | PRN

## 2017-06-08 MED ORDER — HEPARIN SODIUM (PORCINE) 1000 UNIT/ML IJ SOLN
INTRAMUSCULAR | Status: AC
  Filled 2017-06-08: qty 1

## 2017-06-08 MED ORDER — SUGAMMADEX SODIUM 200 MG/2ML IV SOLN
INTRAVENOUS | Status: DC | PRN
  Administered 2017-06-08: 200 mg via INTRAVENOUS

## 2017-06-08 MED ORDER — OXYCODONE-ACETAMINOPHEN 5-325 MG PO TABS: 1.0000 | ORAL_TABLET | Freq: Four times a day (QID) | ORAL | 0 refills | Status: DC | PRN

## 2017-06-08 MED ORDER — MORPHINE SULFATE (PF) 2 MG/ML IV SOLN: 2.0000 mg | INTRAVENOUS | Status: DC | PRN

## 2017-06-08 MED ORDER — SODIUM CHLORIDE 0.9 % IV SOLN: INTRAVENOUS | Status: DC

## 2017-06-08 MED ORDER — ONDANSETRON HCL 4 MG/2ML IJ SOLN: 4.0000 mg | Freq: Four times a day (QID) | INTRAMUSCULAR | Status: DC | PRN

## 2017-06-08 MED ORDER — ACETAMINOPHEN 325 MG RE SUPP
325.0000 mg | RECTAL | Status: DC | PRN
  Filled 2017-06-08: qty 2

## 2017-06-08 MED ORDER — AMLODIPINE BESYLATE 5 MG PO TABS
5.0000 mg | ORAL_TABLET | Freq: Every day | ORAL | Status: DC
  Administered 2017-06-09: 5 mg via ORAL
  Filled 2017-06-08: qty 1

## 2017-06-08 MED ORDER — LACTATED RINGERS IV SOLN
INTRAVENOUS | Status: DC | PRN
  Administered 2017-06-08: 07:00:00 via INTRAVENOUS

## 2017-06-08 MED ORDER — GUAIFENESIN-DM 100-10 MG/5ML PO SYRP: 15.0000 mL | ORAL_SOLUTION | ORAL | Status: DC | PRN

## 2017-06-08 MED ORDER — SODIUM CHLORIDE 0.9 % IV SOLN
INTRAVENOUS | Status: DC
  Administered 2017-06-08 (×2): via INTRAVENOUS

## 2017-06-08 MED ORDER — PRAVASTATIN SODIUM 20 MG PO TABS
20.0000 mg | ORAL_TABLET | Freq: Every day | ORAL | Status: DC
  Administered 2017-06-08: 20 mg via ORAL
  Filled 2017-06-08: qty 1

## 2017-06-08 MED ORDER — POTASSIUM CHLORIDE CRYS ER 20 MEQ PO TBCR: 20.0000 meq | EXTENDED_RELEASE_TABLET | Freq: Every day | ORAL | Status: DC | PRN

## 2017-06-08 MED ORDER — HYDRALAZINE HCL 20 MG/ML IJ SOLN
INTRAMUSCULAR | Status: DC | PRN
  Administered 2017-06-08 (×2): 10 mg via INTRAVENOUS

## 2017-06-08 MED ORDER — POLYETHYLENE GLYCOL 3350 17 G PO PACK: 17.0000 g | PACK | Freq: Every day | ORAL | Status: DC | PRN

## 2017-06-08 MED ORDER — ONDANSETRON HCL 4 MG/2ML IJ SOLN
INTRAMUSCULAR | Status: AC
  Filled 2017-06-08: qty 2

## 2017-06-08 MED ORDER — ROCURONIUM BROMIDE 10 MG/ML (PF) SYRINGE
PREFILLED_SYRINGE | INTRAVENOUS | Status: DC | PRN
  Administered 2017-06-08: 70 mg via INTRAVENOUS
  Administered 2017-06-08: 20 mg via INTRAVENOUS

## 2017-06-08 MED ORDER — PHENYLEPHRINE HCL 10 MG/ML IJ SOLN
INTRAVENOUS | Status: DC | PRN
  Administered 2017-06-08: 20 ug/min via INTRAVENOUS

## 2017-06-08 SURGICAL SUPPLY — 58 items
ADH SKN CLS APL DERMABOND .7 (GAUZE/BANDAGES/DRESSINGS) ×1
BAG BANDED W/RUBBER/TAPE 36X54 (MISCELLANEOUS) ×3 IMPLANT
BAG EQP BAND 135X91 W/RBR TAPE (MISCELLANEOUS) ×1
CANISTER SUCT 3000ML PPV (MISCELLANEOUS) ×3 IMPLANT
CANNULA VESSEL 3MM 2 BLNT TIP (CANNULA) ×2 IMPLANT
CATH ACCU-VU SIZ PIG 5F 100CM (CATHETERS) ×2 IMPLANT
CATH ANGIO 5F BER2 65CM (CATHETERS) ×2 IMPLANT
CLIP LIGATING EXTRA MED SLVR (CLIP) ×2 IMPLANT
CLIP LIGATING EXTRA SM BLUE (MISCELLANEOUS) ×3 IMPLANT
COVER PROBE W GEL 5X96 (DRAPES) ×3 IMPLANT
DERMABOND ADVANCED (GAUZE/BANDAGES/DRESSINGS) ×2
DERMABOND ADVANCED .7 DNX12 (GAUZE/BANDAGES/DRESSINGS) IMPLANT
DEVICE CLOSURE PERCLS PRGLD 6F (VASCULAR PRODUCTS) ×4 IMPLANT
DRSG TEGADERM 2-3/8X2-3/4 SM (GAUZE/BANDAGES/DRESSINGS) ×3 IMPLANT
DRYSEAL FLEXSHEATH 22FR 33CM (SHEATH) ×2
ELECT CAUTERY BLADE 6.4 (BLADE) ×2 IMPLANT
ELECT REM PT RETURN 9FT ADLT (ELECTROSURGICAL) ×6
ELECTRODE REM PT RTRN 9FT ADLT (ELECTROSURGICAL) ×2 IMPLANT
ENDOPROSTHESIS THORAC 34X34X15 (Endovascular Graft) IMPLANT
ENDOPROTH THORACIC 34X34X15 (Endovascular Graft) ×3 IMPLANT
GLOVE BIOGEL PI IND STRL 6.5 (GLOVE) IMPLANT
GLOVE BIOGEL PI INDICATOR 6.5 (GLOVE) ×4
GLOVE SS BIOGEL STRL SZ 7.5 (GLOVE) ×1 IMPLANT
GLOVE SUPERSENSE BIOGEL SZ 7.5 (GLOVE) ×2
GLOVE SURG SS PI 7.0 STRL IVOR (GLOVE) ×2 IMPLANT
GOWN STRL REUS W/ TWL LRG LVL3 (GOWN DISPOSABLE) ×3 IMPLANT
GOWN STRL REUS W/ TWL XL LVL3 (GOWN DISPOSABLE) IMPLANT
GOWN STRL REUS W/TWL LRG LVL3 (GOWN DISPOSABLE) ×6
GOWN STRL REUS W/TWL XL LVL3 (GOWN DISPOSABLE) ×6
KIT BASIN OR (CUSTOM PROCEDURE TRAY) ×3 IMPLANT
KIT ROOM TURNOVER OR (KITS) ×3 IMPLANT
LOOP VESSEL MAXI BLUE (MISCELLANEOUS) ×2 IMPLANT
LOOP VESSEL MINI RED (MISCELLANEOUS) ×2 IMPLANT
NDL PERC 18GX7CM (NEEDLE) ×1 IMPLANT
NEEDLE PERC 18GX7CM (NEEDLE) ×3 IMPLANT
NS IRRIG 1000ML POUR BTL (IV SOLUTION) ×6 IMPLANT
PACK ENDOVASCULAR (PACKS) ×3 IMPLANT
PAD ARMBOARD 7.5X6 YLW CONV (MISCELLANEOUS) ×6 IMPLANT
PENCIL BUTTON HOLSTER BLD 10FT (ELECTRODE) ×3 IMPLANT
PERCLOSE PROGLIDE 6F (VASCULAR PRODUCTS) ×12
SHEATH AVANTI 11CM 8FR (MISCELLANEOUS) IMPLANT
SHEATH DRYSEAL FLEX 22FR 33CM (SHEATH) ×1 IMPLANT
STAPLER VISISTAT 35W (STAPLE) IMPLANT
STOPCOCK MORSE 400PSI 3WAY (MISCELLANEOUS) ×3 IMPLANT
SUT ETHILON 3 0 PS 1 (SUTURE) IMPLANT
SUT PROLENE 5 0 C 1 24 (SUTURE) ×4 IMPLANT
SUT PROLENE 6 0 CC (SUTURE) ×2 IMPLANT
SUT VIC AB 2-0 CT1 27 (SUTURE) ×3
SUT VIC AB 2-0 CT1 TAPERPNT 27 (SUTURE) ×1 IMPLANT
SUT VIC AB 2-0 CTX 36 (SUTURE) IMPLANT
SUT VIC AB 3-0 SH 27 (SUTURE) ×3
SUT VIC AB 3-0 SH 27X BRD (SUTURE) IMPLANT
SYR 30ML LL (SYRINGE) IMPLANT
SYR 50ML LL SCALE MARK (SYRINGE) ×3 IMPLANT
TRAY FOLEY W/METER SILVER 16FR (SET/KITS/TRAYS/PACK) ×3 IMPLANT
TUBING HIGH PRESSURE 120CM (CONNECTOR) ×3 IMPLANT
WIRE BENTSON .035X145CM (WIRE) ×2 IMPLANT
WIRE STIFF LUNDERQUIST 260CM (WIRE) IMPLANT

## 2017-06-08 NOTE — Anesthesia Procedure Notes (Signed)
Procedure Name: Intubation Date/Time: 06/08/2017 7:41 AM Performed by: Mervyn Gay Pre-anesthesia Checklist: Patient identified, Patient being monitored, Timeout performed, Emergency Drugs available and Suction available Patient Re-evaluated:Patient Re-evaluated prior to induction Oxygen Delivery Method: Circle System Utilized Preoxygenation: Pre-oxygenation with 100% oxygen Induction Type: IV induction Ventilation: Mask ventilation without difficulty Laryngoscope Size: Miller and 3 Grade View: Grade I Tube type: Oral Tube size: 7.5 mm Number of attempts: 1 Airway Equipment and Method: Stylet Placement Confirmation: ETT inserted through vocal cords under direct vision,  positive ETCO2 and breath sounds checked- equal and bilateral Secured at: 21 cm Tube secured with: Tape Dental Injury: Teeth and Oropharynx as per pre-operative assessment
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
## 2017-06-08 NOTE — Progress Notes (Addendum)
  Day of Surgery Note    Subjective:  Denies pain.     Vitals:   06/08/17 0618 06/08/17 0930  BP: (!) 160/94 (!) 143/81  Pulse: 67 73  Resp: 19 11  Temp: 98.1 F (36.7 C) 97.9 F (36.6 C)  SpO2: 99% 100%    Incisions:   Right groin incision with minimal bloody drainage distally Extremities:  +palpable DP pulses bilaterally; easily palpable left radial pulse. Cardiac:  regular Lungs:  Non labored Abdomen:  Soft, NT/ND   Assessment/Plan:  This is a 66 y.o. male who is s/p  Thoracic stent graft placement   -pt doing well in pacu with palpable DP pulses bilaterally & easily palpable left radial pulse. -dry gauze to right groin  -pt on prednisone and dose is unclear in chart.  Will ask pt once he is more awake. -to [facility] when bed available   Leontine Locket, PA-C 06/08/2017 9:55 AM [phone]

## 2017-06-08 NOTE — Addendum Note (Signed)
Addendum  created 06/08/17 1200 by Mervyn Gay, CRNA   Anesthesia Intra Meds edited

## 2017-06-08 NOTE — Transfer of Care (Signed)
Immediate Anesthesia Transfer of Care Note  Patient: Trevor [name]  Procedure(s) Performed: Procedure(s): THORACIC AORTIC ENDOVASCULAR STENT GRAFT (N/A)  Patient Location: PACU  Anesthesia Type:General  Level of Consciousness: drowsy and patient cooperative  Airway & Oxygen Therapy: Patient Spontanous Breathing and Patient connected to face mask oxygen  Post-op Assessment: Report given to RN and Post -op Vital signs reviewed and stable  Post vital signs: Reviewed and stable  Last Vitals:  Vitals:   06/08/17 0618  BP: (!) 160/94  Pulse: 67  Resp: 19  Temp: 36.7 C  SpO2: 99%    Last Pain:  Vitals:   06/08/17 0618  TempSrc: Oral      Patients Stated Pain Goal: 3 (64/68/03 2122)  Complications: No apparent anesthesia complications

## 2017-06-08 NOTE — Anesthesia Procedure Notes (Signed)
Arterial Line Insertion Start/End9/26/2018 7:00 AM, 06/08/2017 7:10 AM Performed by: Verdie Drown, CRNA  Patient location: Pre-op. Preanesthetic checklist: patient identified, IV checked, site marked, risks and benefits discussed, surgical consent, monitors and equipment checked, pre-op evaluation, timeout performed and anesthesia consent Lidocaine 1% used for infiltration Right, radial was placed Catheter size: 20 G Hand hygiene performed  and maximum sterile barriers used   Attempts: 2 Procedure performed without using ultrasound guided technique. Following insertion, Biopatch. Post procedure assessment: normal  Patient tolerated the procedure well with no immediate complications.

## 2017-06-08 NOTE — Op Note (Signed)
    OPERATIVE REPORT  DATE OF SURGERY: 06/08/2017  PATIENT: Trevor [name], 66 y.o. male MRN: [id number]  DOB: 10-Jun-1951  PRE-OPERATIVE DIAGNOSIS: Saccular thoracic aortic aneurysm  POST-OPERATIVE DIAGNOSIS:  Same  PROCEDURE: Gore tag stent graft repair thoracic aortic aneurysm  SURGEON:  Curt Jews, M.D.  PHYSICIAN ASSISTANT: Samantha Rhyne PA-C  ANESTHESIA:  Gen.  EBL: 100 ml  Total I/O In: 1800 [I.V.:1800] Out: 310 [Urine:210; Blood:100]  BLOOD ADMINISTERED: None  DRAINS: None  SPECIMEN: None  COUNTS CORRECT:  YES  PLAN OF CARE: PACU   PATIENT DISPOSITION:  PACU - hemodynamically stable  PROCEDURE DETAILS: Patient was taken up and placed supine position where the area of the both groins and abdomen and chest were prepped and draped in usual sterile fashion. Using SonoSite ultrasound the right common femoral artery was entered with an 18-gauge needle just below the level of the inguinal ligament. Guidewire was passed centrally and this was confirmed with fluoroscopy. 2 separate Perclose devices were deployed at 2:00 and 10:00 for eventual closure. 8 French sheath was passed over the guidewire. A guidewire at the level of the aortic arch and a pigtail catheter was positioned in the aortic arch. The Bentson wire was exchanged for a Lunderquist wire and is displaced in the ascending aorta. A French sheath was removed and a 22 French sheath was placed through the right groin. The device was a 34 x 15 cm device. This was positioned through the 32 French sheath and positioned in the descending thoracic aorta. A buddy wire technique was then used to place a pigtail catheter in the ascending arch and the contrast injection revealed the level of the left subclavian artery takeoff. The device was positioned the leg just distal to the left subclavian takeoff and that was deployed. The guidewire was used to straighten the pigtail catheter the pigtail catheter was brought below the level  of the stent graft and was placed back into the arch of the aorta. Injection revealed excellent positioning and no evidence of endoleak. The Lunderquist wire was removed. A Bentson wire was placed back through the pigtail catheter and this was removed. 67 French sheath was removed and the Perclose devices were tightened. Other still was not hemostasis despite this and so the decision was made to cut down on the artery for closure. The 3 French sheath was positioned back to allow hemostasis and an incision was made over the sheath proximal to the puncture site. The common femoral artery was exposed proximal and distal to the entry of the sheath. The sheath was removed and the common femoral was occluded proximally and distally. The arteriotomy was closed with a running 5-0 Prolene suture. Prior to completion of the closure the usual flushing maneuvers were undertaken anastomosis completed. Should be noted the patient was given 8000 units of intravenous heparin prior to placing the 22 French sheath and this was reversed with 50 mg of protamine at the completion of the procedure. The wounds were irrigated with saline. Hemostasis cautery. Wounds were closed with 2-0 Vicryl in several layers and subcutaneous fat and the skin was closed with 3 or septic or Vicryl suture. Sterile dressing was applied the patient had a palpable dorsalis pedis pulse was transferred to the recovery room stable condition   Rosetta Posner, M.D., [facility] 06/08/2017 11:40 AM

## 2017-06-08 NOTE — Anesthesia Postprocedure Evaluation (Signed)
Anesthesia Post Note  Patient: Trevor [name]  Procedure(s) Performed: Procedure(s) (LRB): THORACIC AORTIC ENDOVASCULAR STENT GRAFT (N/A)     Patient location during evaluation: PACU Anesthesia Type: General Level of consciousness: sedated Pain management: pain level controlled Vital Signs Assessment: post-procedure vital signs reviewed and stable Respiratory status: spontaneous breathing and respiratory function stable Cardiovascular status: stable Postop Assessment: no apparent nausea or vomiting Anesthetic complications: no    Last Vitals:  Vitals:   06/08/17 1015 06/08/17 1030  BP: 131/65   Pulse:    Resp:  15  Temp:    SpO2:      Last Pain:  Vitals:   06/08/17 1030  TempSrc:   PainSc: 7                  Darianny Momon DANIEL
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
## 2017-06-08 NOTE — Discharge Instructions (Signed)
° °  [facility]   Discharge Instructions  Endovascular Aortic Aneurysm Repair  Please refer to the following instructions for your post-procedure care. Your surgeon or Physician Assistant will discuss any changes with you.  Activity  You are encouraged to walk as much as you can. You can slowly return to normal activities but must avoid strenuous activity and heavy lifting until your doctor tells you it's OK. Avoid activities such as vacuuming or swinging a gold club. It is normal to feel tired for several weeks after your surgery. Do not drive until your doctor gives the OK and you are no longer taking prescription pain medications. It is also normal to have difficulty with sleep habits, eating, and bowel movements after surgery. These will go away with time.  Bathing/Showering  You may shower after you go home. If you have an incision, do not soak in a bathtub, hot tub, or swim until the incision heals completely.  Incision Care  Shower every day. Clean your incision with mild soap and water. Pat the area dry with a clean towel. You do not need a bandage unless otherwise instructed. Do not apply any ointments or creams to your incision. If you clothing is irritating, you may cover your incision with a dry gauze pad.  Wash the groin wound with soap and water daily and pat dry. (No tub bath-only shower)  Then put a dry gauze or washcloth there to keep this area dry daily and as needed to help prevent wound infection.  Do not use Vaseline or neosporin on your incisions.  Only use soap and water on your incisions and then protect and keep dry.   Diet  Resume your normal diet. There are no special food restrictions following this procedure. A low fat/low cholesterol diet is recommended for all patients with vascular disease. In order to heal from your surgery, it is CRITICAL to get adequate nutrition. Your body requires vitamins, minerals, and protein. Vegetables  are the best source of vitamins and minerals. Vegetables also provide the perfect balance of protein. Processed food has little nutritional value, so try to avoid this.  Medications  Resume taking all of your medications unless your doctor or nurse practitioner tells you not to. If your incision is causing pain, you may take over-the-counter pain relievers such as acetaminophen (Tylenol). If you were prescribed a stronger pain medication, please be aware these medications can cause nausea and constipation. Prevent nausea by taking the medication with a snack or meal. Avoid constipation by drinking plenty of fluids and eating foods with a high amount of fiber, such as fruits, vegetables, and grains. Do not take Tylenol if you are taking prescription pain medications.   Follow up  [place] office will schedule a follow-up appointment with a C.T. scan 3-4 weeks after your surgery.  Please call us immediately for any of the following conditions  Severe or worsening pain in your legs or feet or in your abdomen back or chest. Increased pain, redness, drainage (pus) from your incision sit. Increased abdominal pain, bloating, nausea, vomiting or persistent diarrhea. Fever of 101 degrees or higher. Swelling in your leg (s),  Reduce your risk of vascular disease  Stop smoking. If you would like help call [facility] at 1-800-QUIT-NOW (818) 219-8885) or [place] at (548) 851-4752. Manage your cholesterol Maintain a desired weight Control your diabetes Keep your blood pressure down  If you have questions, please call the office at [phone].

## 2017-06-09 ENCOUNTER — Encounter (HOSPITAL_COMMUNITY): Payer: Self-pay | Admitting: Vascular Surgery

## 2017-06-09 LAB — BASIC METABOLIC PANEL
Anion gap: 9 (ref 5–15)
BUN: 21 mg/dL — AB (ref 6–20)
CHLORIDE: 109 mmol/L (ref 101–111)
CO2: 20 mmol/L — AB (ref 22–32)
Calcium: 8.2 mg/dL — ABNORMAL LOW (ref 8.9–10.3)
Creatinine, Ser: 1.1 mg/dL (ref 0.61–1.24)
GFR calc Af Amer: >60 mL/min (ref >60)
GFR calc non Af Amer: >60 mL/min (ref >60)
Glucose, Bld: 159 mg/dL — ABNORMAL HIGH (ref 65–99)
POTASSIUM: 4.2 mmol/L (ref 3.5–5.1)
SODIUM: 138 mmol/L (ref 135–145)

## 2017-06-09 LAB — TYPE AND SCREEN
ABO/RH(D): O POS
ANTIBODY SCREEN: NEGATIVE
Unit division: 0
Unit division: 0

## 2017-06-09 LAB — CBC
HEMATOCRIT: 34.6 % — AB (ref 39.0–52.0)
HEMOGLOBIN: 10.8 g/dL — AB (ref 13.0–17.0)
MCH: 30.3 pg (ref 26.0–34.0)
MCHC: 31.2 g/dL (ref 30.0–36.0)
MCV: 97.2 fL (ref 78.0–100.0)
Platelets: 201 10*3/uL (ref 150–400)
RBC: 3.56 MIL/uL — AB (ref 4.22–5.81)
RDW: 13.2 % (ref 11.5–15.5)
WBC: 13.6 10*3/uL — ABNORMAL HIGH (ref 4.0–10.5)

## 2017-06-09 LAB — BPAM RBC
BLOOD PRODUCT EXPIRATION DATE: 201810162359
BLOOD PRODUCT EXPIRATION DATE: 201810162359
ISSUE DATE / TIME: 201809201157
UNIT TYPE AND RH: 5100
UNIT TYPE AND RH: 5100

## 2017-06-09 NOTE — Progress Notes (Addendum)
  Progress Note    06/09/2017 7:29 AM 1 Day Post-Op  Subjective: only complaint is a little soreness in his right groin.  Tm 99.2 now afebrile VSS 99% RA  Vitals:   06/09/17 0000 06/09/17 0400  BP: (!) 141/81 (!) 143/90  Pulse:    Resp: 17 (!) 8  Temp: 98.6 F (37 C) 98.6 F (37 C)  SpO2: 98% 99%    Physical Exam: Cardiac:  regular Lungs:  Non labored Incisions:   Right groin is soft without hematoma Extremities:  +palpable DP pulses bilaterally; +palpable left radial pulse   CBC    Component Value Date/Time   WBC 13.6 (H) 06/09/2017 0423   RBC 3.56 (L) 06/09/2017 0423   HGB 10.8 (L) 06/09/2017 0423   HCT 34.6 (L) 06/09/2017 0423   PLT 201 06/09/2017 0423   MCV 97.2 06/09/2017 0423   MCH 30.3 06/09/2017 0423   MCHC 31.2 06/09/2017 0423   RDW 13.2 06/09/2017 0423    BMET    Component Value Date/Time   NA 138 06/09/2017 0423   K 4.2 06/09/2017 0423   CL 109 06/09/2017 0423   CO2 20 (L) 06/09/2017 0423   GLUCOSE 159 (H) 06/09/2017 0423   BUN 21 (H) 06/09/2017 0423   CREATININE 1.10 06/09/2017 0423   CALCIUM 8.2 (L) 06/09/2017 0423   GFRNONAA >60 06/09/2017 0423   GFRAA >60 06/09/2017 0423    INR    Component Value Date/Time   INR 1.08 06/08/2017 1026     Intake/Output Summary (Last 24 hours) at 06/09/17 0729 Last data filed at 06/09/17 6734  Gross per 24 hour  Intake             2580 ml  Output             2510 ml  Net               70 ml     Assessment:  66 y.o. male is s/p:  Gore tag stent graft repair thoracic aortic aneurysm   1 Day Post-Op  Plan: -pt doing well this am with palpable DP pulses bilaterally and left radial pulse -pt's foley just removed.  Needs to void and ambulate prior to dc -f/u with Dr. Donnetta Hutching in 4 weeks with CTA    Leontine Locket, PA-C [facility] [phone] 06/09/2017 7:29 AM  I have examined the patient, reviewed and agree with above.  Curt Jews, MD 06/09/2017 9:52 AM

## 2017-06-09 NOTE — Progress Notes (Signed)
Patient ambulated along the hallways without c/o chest pain or SOB.

## 2017-06-09 NOTE — Care Management Note (Signed)
Case Management Note Marvetta Gibbons RN, BSN Unit 4E-Case Manager [phone]  Patient Details  Name: Trevor [name] MRN: [id number] Date of Birth: 1951/06/21  Subjective/Objective:  Pt admitted s/p stent graft repair thoracic aortic aneurysm                   Action/Plan: PTA pt lived at home with spouse- anticipate return home- CM to follow for any d/c needs  Expected Discharge Date:                  Expected Discharge Plan:  Home/Self Care  In-House Referral:     Discharge planning Services  CM Consult  Post Acute Care Choice:    Choice offered to:     DME Arranged:    DME Agency:     HH Arranged:    HH Agency:     Status of Service:  In process, will continue to follow  If discussed at Long Length of Stay Meetings, dates discussed:    Discharge Disposition:   Additional Comments:  Dawayne Patricia, RN 06/09/2017, 11:04 AM

## 2017-06-09 NOTE — Consult Note (Addendum)
           [facility] CM Primary Care Navigator  06/09/2017  Trevor [name] [birth year] [id number]    Went  to see patient in his room to identify possible discharge needs but he was just discharged per staff report. Patient was discharged home today.  Primary care provider's office called (spoke to [place]) to confirm if patient is under the service of Dr. Kathryne Eriksson with [facility] (which is not under [facility] network). Trevor [name] states that he is an active patient of their office.  Notified Trevor [name] of patient's discharge and need for post hospital follow-up and transition of care. She states that they will be calling patient to schedule a follow-up appointment.   For questions, please contact:  Dannielle Huh, BSN, RN- [facility] Primary Care Navigator  Telephone: [phone] [place]

## 2017-06-09 NOTE — Progress Notes (Addendum)
Patient has reported some redness to face and chest after the 3rd dose of Cefuroxime. Patient has no rash or Hives. Denies chest pain or SOB. Patient states "i feel just fine" but requested cefuroxime to be added as an allergy. Same done. Patient has no other doses scheduled. Will continue to monitor

## 2017-06-15 ENCOUNTER — Telehealth: Payer: Self-pay | Admitting: Vascular Surgery

## 2017-06-15 NOTE — Telephone Encounter (Signed)
-----   Message from Mena Goes, RN sent at 06/08/2017  9:39 AM EDT ----- Regarding: 4 weeks w/ CTA chest, abdom, pelvis   ----- Message ----- From: Gabriel Earing, PA-C Sent: 06/08/2017   9:34 AM To: Vvs Charge Pool  S/p thoracic stent graft 06/08/17.  F/u with Dr. Donnetta Hutching in 4 weeks with CTA protocol.  Thanks

## 2017-06-15 NOTE — Telephone Encounter (Signed)
Sched appts 07/13/17. CTA at 9:00 at [place]. MD at 11:30. Spoke to pt's wife.

## 2017-06-29 DIAGNOSIS — Z23 Encounter for immunization: Secondary | ICD-10-CM | POA: Diagnosis not present

## 2017-07-13 ENCOUNTER — Ambulatory Visit
Admission: RE | Admit: 2017-07-13 | Discharge: 2017-07-13 | Disposition: A | Discharge status: Home / Self Care | Payer: PPO | Source: Ambulatory Visit | Attending: Vascular Surgery | Admitting: Vascular Surgery

## 2017-07-13 ENCOUNTER — Encounter: Payer: Self-pay | Admitting: Vascular Surgery

## 2017-07-13 ENCOUNTER — Ambulatory Visit (INDEPENDENT_AMBULATORY_CARE_PROVIDER_SITE_OTHER): Payer: Self-pay | Admitting: Vascular Surgery

## 2017-07-13 VITALS — BP 167/95 | HR 80 | Temp 98.4°F | Resp 20 | Ht 76.0 in | Wt 254.0 lb

## 2017-07-13 DIAGNOSIS — I712 Thoracic aortic aneurysm, without rupture, unspecified: Secondary | ICD-10-CM

## 2017-07-13 DIAGNOSIS — I714 Abdominal aortic aneurysm, without rupture: Secondary | ICD-10-CM | POA: Diagnosis not present

## 2017-07-13 MED ORDER — IOPAMIDOL (ISOVUE-370) INJECTION 76%
75.0000 mL | Freq: Once | INTRAVENOUS | Status: AC | PRN
  Administered 2017-07-13: 75 mL via INTRAVENOUS

## 2017-07-13 NOTE — Progress Notes (Signed)
   Patient name: Trevor [name] MRN: [id number] DOB: Mar 08, 1951 Sex: male  REASON FOR VISIT: Follow-up thoracic stent graft  HPI: Trevor [name] is a 66 y.o. male here today for follow-up.  He underwent Gore tag drastic stent graft repair of a saccular thoracic aneurysm on 06/08/2017.  Did have cut down of his right femoral artery after failure of a Perclose closure device.  He was discharged home on postop day 1 he continues to do well.  He has had complete healing of his groin incision and is returned to his usual activity  Current Outpatient Prescriptions  Medication Sig Dispense Refill  . amLODipine (NORVASC) 5 MG tablet Take 5 mg by mouth daily. Reported on 01/13/2016    . amphetamine-dextroamphetamine (ADDERALL) 30 MG tablet Take 30 mg by mouth 2 (two) times daily.    Marland Kitchen aspirin EC 81 MG tablet Take 1 tablet (81 mg total) by mouth daily.    Marland Kitchen buPROPion (WELLBUTRIN SR) 200 MG 12 hr tablet Take 200 mg by mouth 2 (two) times daily.    Marland Kitchen losartan-hydrochlorothiazide (HYZAAR) 100-12.5 MG tablet Take 1 tablet by mouth daily.  1  . lovastatin (MEVACOR) 20 MG tablet Take 40 mg by mouth daily.     . meloxicam (MOBIC) 15 MG tablet Take 7.5-15 mg by mouth daily as needed for pain.     Marland Kitchen oxyCODONE-acetaminophen (ROXICET) 5-325 MG tablet Take 1 tablet by mouth every 6 (six) hours as needed for moderate pain or severe pain. 16 tablet 0  . predniSONE (DELTASONE) 20 MG tablet See admin instructions. 20mg  tid x 3 20 bid x 3 1 qd x 3     No current facility-administered medications for this visit.      PHYSICAL EXAM: Vitals:   07/13/17 1141  BP: (!) 167/95  Pulse: 80  Resp: 20  Temp: 98.4 F (36.9 C)  SpO2: 96%  Weight: 254 lb (115.2 kg)  Height: 6\' 4"  (1.93 m)    GENERAL: The patient is a well-nourished male, in no acute distress. The vital signs are documented above. Right groin incisions completely healed.  2+ dorsalis pedis pulses bilaterally.  2+ radial  pulses bilaterally.  Chest clear to auscultation  CT scan of his chest today was reviewed.  This revealed excellent positioning of the stent graft just distal to the left subclavian takeoff covering the saccular aneurysm with no endoleak  MEDICAL ISSUES: Stable 1 month status post stent graft repair of saccular thoracic aneurysm.  Full activities.  We will see him again in 1 year CT scan at that time.  If this continues to show no change would drop back to 2-year evaluation following this.   Rosetta Posner, MD FACS [facility] Tel [phone] Pager [phone]

## 2017-07-18 ENCOUNTER — Encounter: Payer: Self-pay | Admitting: Internal Medicine

## 2017-08-08 DIAGNOSIS — S29011D Strain of muscle and tendon of front wall of thorax, subsequent encounter: Secondary | ICD-10-CM | POA: Diagnosis not present

## 2017-08-08 DIAGNOSIS — M7581 Other shoulder lesions, right shoulder: Secondary | ICD-10-CM | POA: Diagnosis not present

## 2017-08-09 ENCOUNTER — Encounter: Payer: Self-pay | Admitting: Internal Medicine

## 2017-08-26 DIAGNOSIS — M7581 Other shoulder lesions, right shoulder: Secondary | ICD-10-CM | POA: Diagnosis not present

## 2017-08-31 DIAGNOSIS — T84038A Mechanical loosening of other internal prosthetic joint, initial encounter: Secondary | ICD-10-CM | POA: Diagnosis not present

## 2017-08-31 DIAGNOSIS — Z967 Presence of other bone and tendon implants: Secondary | ICD-10-CM | POA: Diagnosis not present

## 2017-08-31 DIAGNOSIS — M19071 Primary osteoarthritis, right ankle and foot: Secondary | ICD-10-CM | POA: Diagnosis not present

## 2017-08-31 DIAGNOSIS — M79671 Pain in right foot: Secondary | ICD-10-CM | POA: Diagnosis not present

## 2017-08-31 DIAGNOSIS — Z981 Arthrodesis status: Secondary | ICD-10-CM | POA: Diagnosis not present

## 2017-08-31 DIAGNOSIS — S92355A Nondisplaced fracture of fifth metatarsal bone, left foot, initial encounter for closed fracture: Secondary | ICD-10-CM | POA: Diagnosis not present

## 2017-09-08 ENCOUNTER — Other Ambulatory Visit: Payer: Self-pay

## 2017-09-08 ENCOUNTER — Ambulatory Visit (AMBULATORY_SURGERY_CENTER): Payer: Self-pay | Admitting: *Deleted

## 2017-09-08 VITALS — Ht 76.0 in | Wt 253.0 lb

## 2017-09-08 DIAGNOSIS — Z800 Family history of malignant neoplasm of digestive organs: Secondary | ICD-10-CM

## 2017-09-08 DIAGNOSIS — Z86010 Personal history of colonic polyps: Secondary | ICD-10-CM

## 2017-09-08 NOTE — Progress Notes (Signed)
No egg or soy allergy known to patient  No issues with past sedation with any surgeries  or procedures, no intubation problems  No diet pills per patient No home 02 use per patient  No blood thinners per patient  Pt denies issues with constipation  No A fib or A flutter  EMMI video sent to pt's e mail - pt has seen

## 2017-09-13 HISTORY — PX: ROTATOR CUFF REPAIR: SHX139

## 2017-09-15 ENCOUNTER — Ambulatory Visit: Payer: PPO | Attending: Family Medicine | Admitting: Physical Therapy

## 2017-09-15 DIAGNOSIS — R293 Abnormal posture: Secondary | ICD-10-CM | POA: Diagnosis not present

## 2017-09-15 DIAGNOSIS — M6281 Muscle weakness (generalized): Secondary | ICD-10-CM | POA: Diagnosis not present

## 2017-09-15 DIAGNOSIS — M79602 Pain in left arm: Secondary | ICD-10-CM | POA: Insufficient documentation

## 2017-09-15 DIAGNOSIS — M62838 Other muscle spasm: Secondary | ICD-10-CM | POA: Insufficient documentation

## 2017-09-15 DIAGNOSIS — M79601 Pain in right arm: Secondary | ICD-10-CM | POA: Insufficient documentation

## 2017-09-15 NOTE — Patient Instructions (Signed)
   PROM Shoulder Flexion on Table  Rest your affected shoulder's hand on a counter or table as pictured. Lean forward allowing your hand to slide forward increasing the range of motion at your shoulder. Lean back and repeat. Perform this exercise in a relatively pain-free range of motion.  5 sec hold; 10x; do 2-3x/ day  [facility] [address], [place], [place] [address] Phone # [phone] Fax [phone]
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
## 2017-09-19 ENCOUNTER — Ambulatory Visit: Payer: PPO | Admitting: Physical Therapy

## 2017-09-19 DIAGNOSIS — M62838 Other muscle spasm: Secondary | ICD-10-CM

## 2017-09-19 DIAGNOSIS — M79601 Pain in right arm: Secondary | ICD-10-CM

## 2017-09-19 DIAGNOSIS — M79602 Pain in left arm: Secondary | ICD-10-CM

## 2017-09-19 DIAGNOSIS — M6281 Muscle weakness (generalized): Secondary | ICD-10-CM

## 2017-09-19 DIAGNOSIS — R293 Abnormal posture: Secondary | ICD-10-CM

## 2017-09-19 NOTE — Patient Instructions (Signed)
   ELASTIC BAND EXTENSION BILATERAL SHOULDER  While holding an elastic band with both arms in front of you with your elbows straight, pull the band downwards and back towards your side. 3 sets of 10 reps    ELASTIC BAND ROWS   Holding elastic band with both hands, draw back the band as you bend your elbows. Keep your elbows near the side of your body. Do 3 sets of 10 reps    ELASTIC BAND BILATERAL EXTERNAL ROTATION - ER  While holding an elastic band with your elbows bent, pull your hands away from your stomach area. Keep  your elbows near the side of your body. Do 3 sets of 10    Shoulder AROM Flexion Scaption Abduction  With good upright posture, lift the arm straight up in front (reps and sets), up while slightly out at an angle (reps and sets), up while out to the side (reps and sets).   Raise the arm with the thumb pointing upward.  Try not to lift your shoulder in a shrug. Do 10 reps each    Doorway Pec Stretch:  Start by having the affected arm flush against the side of the wall at ~90 degrees between your arm and torso and also the arm and elbow. Have the opposite leg forward and the other back in a modified lunging stance. Slowly begin to lean forward and there will be a stretching sensation through the pectoral muscle group of the arm on the wall.  Hold 30 sec and repeat 3 x each side  [facility] [address] [place], [place] [address] Phone # [phone] Fax [phone]
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
## 2017-09-21 ENCOUNTER — Other Ambulatory Visit: Payer: Self-pay

## 2017-09-21 ENCOUNTER — Ambulatory Visit (AMBULATORY_SURGERY_CENTER): Payer: PPO | Admitting: Internal Medicine

## 2017-09-21 ENCOUNTER — Encounter: Payer: Self-pay | Admitting: Internal Medicine

## 2017-09-21 VITALS — BP 143/83 | HR 67 | Temp 97.8°F | Resp 14 | Ht 76.0 in | Wt 254.0 lb

## 2017-09-21 DIAGNOSIS — D124 Benign neoplasm of descending colon: Secondary | ICD-10-CM | POA: Diagnosis not present

## 2017-09-21 DIAGNOSIS — Z86010 Personal history of colonic polyps: Secondary | ICD-10-CM

## 2017-09-21 DIAGNOSIS — D123 Benign neoplasm of transverse colon: Secondary | ICD-10-CM | POA: Diagnosis not present

## 2017-09-21 DIAGNOSIS — F900 Attention-deficit hyperactivity disorder, predominantly inattentive type: Secondary | ICD-10-CM | POA: Diagnosis not present

## 2017-09-21 DIAGNOSIS — Z1211 Encounter for screening for malignant neoplasm of colon: Secondary | ICD-10-CM | POA: Diagnosis not present

## 2017-09-21 DIAGNOSIS — I10 Essential (primary) hypertension: Secondary | ICD-10-CM | POA: Diagnosis not present

## 2017-09-21 DIAGNOSIS — K635 Polyp of colon: Secondary | ICD-10-CM | POA: Diagnosis not present

## 2017-09-21 DIAGNOSIS — D121 Benign neoplasm of appendix: Secondary | ICD-10-CM | POA: Diagnosis not present

## 2017-09-21 MED ORDER — SODIUM CHLORIDE 0.9 % IV SOLN: 500.0000 mL | INTRAVENOUS | Status: DC

## 2017-09-21 NOTE — Patient Instructions (Signed)
  Information on polyps given to you today   Await pathology results in a letter from Dr Carlean Purl   YOU HAD AN ENDOSCOPIC PROCEDURE TODAY AT THE [facility]:   Refer to the procedure report that was given to you for any specific questions about what was found during the examination.  If the procedure report does not answer your questions, please call your gastroenterologist to clarify.  If you requested that your care partner not be given the details of your procedure findings, then the procedure report has been included in a sealed envelope for you to review at your convenience later.  YOU SHOULD EXPECT: Some feelings of bloating in the abdomen. Passage of more gas than usual.  Walking can help get rid of the air that was put into your GI tract during the procedure and reduce the bloating. If you had a lower endoscopy (such as a colonoscopy or flexible sigmoidoscopy) you may notice spotting of blood in your stool or on the toilet paper. If you underwent a bowel prep for your procedure, you may not have a normal bowel movement for a few days.  Please Note:  You might notice some irritation and congestion in your nose or some drainage.  This is from the oxygen used during your procedure.  There is no need for concern and it should clear up in a day or so.  SYMPTOMS TO REPORT IMMEDIATELY:   Following lower endoscopy (colonoscopy or flexible sigmoidoscopy):  Excessive amounts of blood in the stool  Significant tenderness or worsening of abdominal pains  Swelling of the abdomen that is new, acute  Fever of 100F or higher    For urgent or emergent issues, a gastroenterologist can be reached at any hour by calling [phone].   DIET:  We do recommend a small meal at first, but then you may proceed to your regular diet.  Drink plenty of fluids but you should avoid alcoholic beverages for 24 hours.  ACTIVITY:  You should plan to take it easy for the rest of today and you should  NOT DRIVE or use heavy machinery until tomorrow (because of the sedation medicines used during the test).    FOLLOW UP: Our staff will call the number listed on your records the next business day following your procedure to check on you and address any questions or concerns that you may have regarding the information given to you following your procedure. If we do not reach you, we will leave a message.  However, if you are feeling well and you are not experiencing any problems, there is no need to return our call.  We will assume that you have returned to your regular daily activities without incident.  If any biopsies were taken you will be contacted by phone or by letter within the next 1-3 weeks.  Please call us at [phone] if you have not heard about the biopsies in 3 weeks.    SIGNATURES/CONFIDENTIALITY: You and/or your care partner have signed paperwork which will be entered into your electronic medical record.  These signatures attest to the fact that that the information above on your After Visit Summary has been reviewed and is understood.  Full responsibility of the confidentiality of this discharge information lies with you and/or your care-partner.

## 2017-09-21 NOTE — Op Note (Signed)
[place] Patient Name: Trevor [name] Procedure Date: 09/21/2017 7:56 AM MRN: [id number] Endoscopist: Gatha Mayer , MD Age: 67 Referring MD:  Date of Birth: April 30, 1951 Gender: Male Account #: [id number] Procedure:                Colonoscopy Indications:              High risk colon cancer surveillance: Personal                            history of colonic polyps Medicines:                Propofol per Anesthesia, Monitored Anesthesia Care Procedure:                Pre-Anesthesia Assessment:                           - Prior to the procedure, a History and Physical                            was performed, and patient medications and                            allergies were reviewed. The patient's tolerance of                            previous anesthesia was also reviewed. The risks                            and benefits of the procedure and the sedation                            options and risks were discussed with the patient.                            All questions were answered, and informed consent                            was obtained. Prior Anticoagulants: The patient has                            taken no previous anticoagulant or antiplatelet                            agents. ASA Grade Assessment: III - A patient with                            severe systemic disease. After reviewing the risks                            and benefits, the patient was deemed in                            satisfactory condition to undergo the procedure.  After obtaining informed consent, the colonoscope                            was passed under direct vision. Throughout the                            procedure, the patient's blood pressure, pulse, and                            oxygen saturations were monitored continuously. The                            Model CF-HQ190L [id number]) scope was introduced                            through the  anus and advanced to the the cecum,                            identified by appendiceal orifice and ileocecal                            valve. The colonoscopy was somewhat difficult due                            to significant looping. Successful completion of                            the procedure was aided by using manual pressure.                            The patient tolerated the procedure well. The                            quality of the bowel preparation was adequate. The                            bowel preparation used was Miralax. The ileocecal                            valve, appendiceal orifice, and rectum were                            photographed. Scope In: 8:07:06 AM Scope Out: 8:41:41 AM Scope Withdrawal Time: 0 hours 30 minutes 13 seconds  Total Procedure Duration: 0 hours 34 minutes 35 seconds  Findings:                 The perianal and digital rectal examinations were                            normal. Pertinent negatives include normal prostate                            (size, shape, and consistency).  Three sessile polyps were found in the descending                            colon, transverse colon and appendiceal orifice.                            The polyps were diminutive in size. These polyps                            were removed with a cold snare. Resection and                            retrieval were complete. Verification of patient                            identification for the specimen was done. Estimated                            blood loss was minimal.                           The exam was otherwise without abnormality on                            direct and retroflexion views. Complications:            No immediate complications. Estimated Blood Loss:     Estimated blood loss was minimal. Impression:               - Three diminutive polyps in the descending colon,                            in the  transverse colon and at the appendiceal                            orifice, removed with a cold snare. Resected and                            retrieved.                           - The examination was otherwise normal on direct                            and retroflexion views.                           - Personal history of colonic polyps. multipla                            adenomas and ssp's since 2004 some ? attenuated                            polyposis Recommendation:           - Patient has a contact number available  for                            emergencies. The signs and symptoms of potential                            delayed complications were discussed with the                            patient. Return to normal activities tomorrow.                            Written discharge instructions were provided to the                            patient.                           - Resume previous diet.                           - Continue present medications.                           - Repeat colonoscopy is recommended for                            surveillance. The colonoscopy date will be                            determined after pathology results from today's                            exam become available for review. 2-3 years                           Use different prep Gatha Mayer, MD 09/21/2017 8:49:39 AM This report has been signed electronically.

## 2017-09-21 NOTE — Progress Notes (Signed)
Please refer to the blue and neon green sheets for instructions regarding diet and activity for the rest of today.Called to room to assist during endoscopic procedure.  Patient ID and intended procedure confirmed with present staff. Received instructions for my participation in the procedure from the performing physician. 

## 2017-09-21 NOTE — Progress Notes (Signed)
Pt's states no medical or surgical changes since previsit or office visit.Pt's states no medical or surgical changes since previsit or office visit. 

## 2017-09-22 ENCOUNTER — Telehealth: Payer: Self-pay

## 2017-09-22 NOTE — Telephone Encounter (Signed)
  Follow up Call-  Call back number 09/21/2017 05/29/2015  Post procedure Call Back phone  # [phone]  Permission to leave phone message Yes Yes  Some recent data might be hidden     Left message

## 2017-09-23 ENCOUNTER — Ambulatory Visit: Payer: PPO | Admitting: Physical Therapy

## 2017-09-23 ENCOUNTER — Encounter: Payer: Self-pay | Admitting: Physical Therapy

## 2017-09-23 DIAGNOSIS — R293 Abnormal posture: Secondary | ICD-10-CM

## 2017-09-23 DIAGNOSIS — M79602 Pain in left arm: Secondary | ICD-10-CM

## 2017-09-23 DIAGNOSIS — M6281 Muscle weakness (generalized): Secondary | ICD-10-CM

## 2017-09-23 DIAGNOSIS — M79601 Pain in right arm: Secondary | ICD-10-CM | POA: Diagnosis not present

## 2017-09-23 DIAGNOSIS — M62838 Other muscle spasm: Secondary | ICD-10-CM
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
## 2017-09-26 ENCOUNTER — Ambulatory Visit: Payer: PPO | Admitting: Physical Therapy

## 2017-09-26 ENCOUNTER — Encounter: Payer: Self-pay | Admitting: Internal Medicine

## 2017-09-26 ENCOUNTER — Encounter: Payer: Self-pay | Admitting: Physical Therapy

## 2017-09-26 DIAGNOSIS — M62838 Other muscle spasm: Secondary | ICD-10-CM

## 2017-09-26 DIAGNOSIS — M79601 Pain in right arm: Secondary | ICD-10-CM

## 2017-09-26 DIAGNOSIS — M79602 Pain in left arm: Secondary | ICD-10-CM

## 2017-09-26 DIAGNOSIS — Z86010 Personal history of colonic polyps: Secondary | ICD-10-CM

## 2017-09-26 DIAGNOSIS — R293 Abnormal posture: Secondary | ICD-10-CM

## 2017-09-26 DIAGNOSIS — M6281 Muscle weakness (generalized): Secondary | ICD-10-CM
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
## 2017-09-26 NOTE — Progress Notes (Signed)
Adenomas  Recall 2 years - attenuated polyposis suspected

## 2017-09-29 DIAGNOSIS — R0602 Shortness of breath: Secondary | ICD-10-CM

## 2017-09-29 DIAGNOSIS — R0789 Other chest pain: Secondary | ICD-10-CM

## 2017-09-29 DIAGNOSIS — F988 Other specified behavioral and emotional disorders with onset usually occurring in childhood and adolescence: Secondary | ICD-10-CM | POA: Diagnosis not present

## 2017-09-29 DIAGNOSIS — I10 Essential (primary) hypertension: Secondary | ICD-10-CM | POA: Diagnosis not present

## 2017-09-29 DIAGNOSIS — F329 Major depressive disorder, single episode, unspecified: Secondary | ICD-10-CM | POA: Diagnosis not present

## 2017-09-29 DIAGNOSIS — Z0000 Encounter for general adult medical examination without abnormal findings: Secondary | ICD-10-CM

## 2017-09-29 HISTORY — DX: Encounter for general adult medical examination without abnormal findings: Z00.00

## 2017-09-29 HISTORY — DX: Shortness of breath: R06.02

## 2017-09-29 HISTORY — DX: Other chest pain: R07.89

## 2017-09-30 ENCOUNTER — Encounter: Payer: Self-pay | Admitting: Physical Therapy

## 2017-09-30 ENCOUNTER — Ambulatory Visit: Payer: PPO | Admitting: Physical Therapy

## 2017-09-30 DIAGNOSIS — R293 Abnormal posture: Secondary | ICD-10-CM

## 2017-09-30 DIAGNOSIS — M6281 Muscle weakness (generalized): Secondary | ICD-10-CM

## 2017-09-30 DIAGNOSIS — M62838 Other muscle spasm: Secondary | ICD-10-CM

## 2017-09-30 DIAGNOSIS — M79602 Pain in left arm: Secondary | ICD-10-CM

## 2017-09-30 DIAGNOSIS — M79601 Pain in right arm: Secondary | ICD-10-CM | POA: Diagnosis not present
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
## 2017-10-03 ENCOUNTER — Encounter: Payer: PPO | Admitting: Physical Therapy

## 2017-10-05 ENCOUNTER — Telehealth: Payer: Self-pay

## 2017-10-05 NOTE — Telephone Encounter (Signed)
Returned call to patient. He stated that the place on his right ankle has opened up again and he needs to be seen. I consulted with Norberto Sorenson, Vein RN and we have set him up for a Venus Reflux study on right leg for 10/27/17 and follow-up appointment with Dr. Donnetta Hutching for 11/01/17. Confirmed appointments with patient.

## 2017-10-07 ENCOUNTER — Encounter: Payer: Self-pay | Admitting: Physical Therapy

## 2017-10-07 ENCOUNTER — Ambulatory Visit: Payer: PPO | Admitting: Physical Therapy

## 2017-10-07 DIAGNOSIS — M79602 Pain in left arm: Secondary | ICD-10-CM

## 2017-10-07 DIAGNOSIS — M6281 Muscle weakness (generalized): Secondary | ICD-10-CM

## 2017-10-07 DIAGNOSIS — M79601 Pain in right arm: Secondary | ICD-10-CM

## 2017-10-07 DIAGNOSIS — M62838 Other muscle spasm: Secondary | ICD-10-CM

## 2017-10-07 DIAGNOSIS — R293 Abnormal posture: Secondary | ICD-10-CM
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
## 2017-10-10 ENCOUNTER — Ambulatory Visit: Payer: PPO | Admitting: Physical Therapy

## 2017-10-10 ENCOUNTER — Other Ambulatory Visit: Payer: Self-pay

## 2017-10-10 ENCOUNTER — Telehealth: Payer: Self-pay

## 2017-10-10 ENCOUNTER — Encounter: Payer: Self-pay | Admitting: Physical Therapy

## 2017-10-10 DIAGNOSIS — M62838 Other muscle spasm: Secondary | ICD-10-CM

## 2017-10-10 DIAGNOSIS — I83013 Varicose veins of right lower extremity with ulcer of ankle: Secondary | ICD-10-CM

## 2017-10-10 DIAGNOSIS — M79601 Pain in right arm: Secondary | ICD-10-CM

## 2017-10-10 DIAGNOSIS — M6281 Muscle weakness (generalized): Secondary | ICD-10-CM

## 2017-10-10 DIAGNOSIS — L97319 Non-pressure chronic ulcer of right ankle with unspecified severity: Principal | ICD-10-CM

## 2017-10-10 DIAGNOSIS — M79602 Pain in left arm: Secondary | ICD-10-CM

## 2017-10-10 DIAGNOSIS — R293 Abnormal posture: Secondary | ICD-10-CM

## 2017-10-10 NOTE — Patient Instructions (Signed)
ELBOW: Wall Push-Up    With arms slightly wider than shoulders, gently lean body to wall. Push body away from wall by straightening elbows. __5_ reps per set, __5_ sets per day.  Do 1x a day  Copyright  VHI. All rights reserved.

## 2017-10-10 NOTE — Telephone Encounter (Signed)
SENT NOTES TO SCHEDULING 
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
## 2017-10-11 ENCOUNTER — Encounter: Payer: Self-pay | Admitting: Vascular Surgery

## 2017-10-11 ENCOUNTER — Ambulatory Visit: Payer: PPO | Admitting: Vascular Surgery

## 2017-10-11 ENCOUNTER — Ambulatory Visit (HOSPITAL_COMMUNITY)
Admission: RE | Admit: 2017-10-11 | Discharge: 2017-10-11 | Disposition: A | Discharge status: Home / Self Care | Payer: PPO | Source: Ambulatory Visit | Attending: Vascular Surgery | Admitting: Vascular Surgery

## 2017-10-11 VITALS — BP 144/84 | HR 78 | Temp 98.0°F | Resp 16 | Ht 76.0 in | Wt 253.0 lb

## 2017-10-11 DIAGNOSIS — L97329 Non-pressure chronic ulcer of left ankle with unspecified severity: Secondary | ICD-10-CM

## 2017-10-11 DIAGNOSIS — I872 Venous insufficiency (chronic) (peripheral): Secondary | ICD-10-CM | POA: Insufficient documentation

## 2017-10-11 DIAGNOSIS — I83013 Varicose veins of right lower extremity with ulcer of ankle: Secondary | ICD-10-CM

## 2017-10-11 DIAGNOSIS — I83023 Varicose veins of left lower extremity with ulcer of ankle: Secondary | ICD-10-CM

## 2017-10-11 DIAGNOSIS — L97319 Non-pressure chronic ulcer of right ankle with unspecified severity: Secondary | ICD-10-CM | POA: Diagnosis not present
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
## 2017-10-13 ENCOUNTER — Other Ambulatory Visit: Payer: Self-pay | Admitting: *Deleted

## 2017-10-13 ENCOUNTER — Ambulatory Visit: Payer: PPO | Admitting: Cardiology

## 2017-10-13 ENCOUNTER — Encounter: Payer: Self-pay | Admitting: *Deleted

## 2017-10-13 DIAGNOSIS — Z981 Arthrodesis status: Secondary | ICD-10-CM | POA: Diagnosis not present

## 2017-10-13 DIAGNOSIS — M85871 Other specified disorders of bone density and structure, right ankle and foot: Secondary | ICD-10-CM | POA: Diagnosis not present

## 2017-10-13 DIAGNOSIS — M7751 Other enthesopathy of right foot: Secondary | ICD-10-CM | POA: Diagnosis not present

## 2017-10-13 DIAGNOSIS — S92511D Displaced fracture of proximal phalanx of right lesser toe(s), subsequent encounter for fracture with routine healing: Secondary | ICD-10-CM | POA: Diagnosis not present

## 2017-10-13 DIAGNOSIS — T84213A Breakdown (mechanical) of internal fixation device of bones of foot and toes, initial encounter: Secondary | ICD-10-CM | POA: Diagnosis not present

## 2017-10-13 DIAGNOSIS — M79671 Pain in right foot: Secondary | ICD-10-CM | POA: Diagnosis not present

## 2017-10-14 ENCOUNTER — Ambulatory Visit: Payer: PPO | Attending: Family Medicine | Admitting: Physical Therapy

## 2017-10-14 ENCOUNTER — Encounter: Payer: Self-pay | Admitting: Physical Therapy

## 2017-10-14 DIAGNOSIS — M62838 Other muscle spasm: Secondary | ICD-10-CM | POA: Diagnosis not present

## 2017-10-14 DIAGNOSIS — M6281 Muscle weakness (generalized): Secondary | ICD-10-CM | POA: Diagnosis not present

## 2017-10-14 DIAGNOSIS — R293 Abnormal posture: Secondary | ICD-10-CM | POA: Diagnosis not present

## 2017-10-14 DIAGNOSIS — M79601 Pain in right arm: Secondary | ICD-10-CM | POA: Diagnosis not present

## 2017-10-14 DIAGNOSIS — M79602 Pain in left arm: Secondary | ICD-10-CM | POA: Insufficient documentation
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
## 2017-10-17 ENCOUNTER — Ambulatory Visit: Payer: PPO | Admitting: Cardiology

## 2017-10-17 ENCOUNTER — Encounter: Payer: PPO | Admitting: Physical Therapy

## 2017-10-21 ENCOUNTER — Encounter: Payer: PPO | Admitting: Physical Therapy

## 2017-10-24 ENCOUNTER — Encounter: Payer: PPO | Admitting: Physical Therapy

## 2017-10-24 DIAGNOSIS — M5030 Other cervical disc degeneration, unspecified cervical region: Secondary | ICD-10-CM | POA: Diagnosis not present

## 2017-10-24 DIAGNOSIS — M47812 Spondylosis without myelopathy or radiculopathy, cervical region: Secondary | ICD-10-CM | POA: Diagnosis not present

## 2017-10-24 DIAGNOSIS — S29011A Strain of muscle and tendon of front wall of thorax, initial encounter: Secondary | ICD-10-CM | POA: Diagnosis not present

## 2017-10-25 ENCOUNTER — Encounter (HOSPITAL_BASED_OUTPATIENT_CLINIC_OR_DEPARTMENT_OTHER): Payer: PPO | Attending: Internal Medicine

## 2017-10-25 DIAGNOSIS — I87332 Chronic venous hypertension (idiopathic) with ulcer and inflammation of left lower extremity: Secondary | ICD-10-CM | POA: Diagnosis not present

## 2017-10-25 DIAGNOSIS — Z955 Presence of coronary angioplasty implant and graft: Secondary | ICD-10-CM | POA: Insufficient documentation

## 2017-10-25 DIAGNOSIS — Z87820 Personal history of traumatic brain injury: Secondary | ICD-10-CM | POA: Diagnosis not present

## 2017-10-25 DIAGNOSIS — Z86718 Personal history of other venous thrombosis and embolism: Secondary | ICD-10-CM | POA: Insufficient documentation

## 2017-10-25 DIAGNOSIS — L97322 Non-pressure chronic ulcer of left ankle with fat layer exposed: Secondary | ICD-10-CM | POA: Insufficient documentation

## 2017-10-25 DIAGNOSIS — Z87891 Personal history of nicotine dependence: Secondary | ICD-10-CM | POA: Diagnosis not present

## 2017-10-25 DIAGNOSIS — I10 Essential (primary) hypertension: Secondary | ICD-10-CM | POA: Diagnosis not present

## 2017-10-27 ENCOUNTER — Encounter (HOSPITAL_COMMUNITY): Payer: PPO

## 2017-10-28 ENCOUNTER — Encounter: Payer: PPO | Admitting: Physical Therapy

## 2017-11-01 ENCOUNTER — Ambulatory Visit: Payer: PPO | Admitting: Vascular Surgery

## 2017-11-01 DIAGNOSIS — I87332 Chronic venous hypertension (idiopathic) with ulcer and inflammation of left lower extremity: Secondary | ICD-10-CM | POA: Diagnosis not present

## 2017-11-01 DIAGNOSIS — L97322 Non-pressure chronic ulcer of left ankle with fat layer exposed: Secondary | ICD-10-CM | POA: Diagnosis not present

## 2017-11-01 DIAGNOSIS — I87312 Chronic venous hypertension (idiopathic) with ulcer of left lower extremity: Secondary | ICD-10-CM | POA: Diagnosis not present
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
## 2017-11-08 DIAGNOSIS — I87312 Chronic venous hypertension (idiopathic) with ulcer of left lower extremity: Secondary | ICD-10-CM | POA: Diagnosis not present

## 2017-11-08 DIAGNOSIS — L97322 Non-pressure chronic ulcer of left ankle with fat layer exposed: Secondary | ICD-10-CM | POA: Diagnosis not present

## 2017-11-08 DIAGNOSIS — I87332 Chronic venous hypertension (idiopathic) with ulcer and inflammation of left lower extremity: Secondary | ICD-10-CM | POA: Diagnosis not present

## 2017-11-11 ENCOUNTER — Encounter: Payer: Self-pay | Admitting: Cardiovascular Disease

## 2017-11-11 ENCOUNTER — Ambulatory Visit: Payer: PPO | Admitting: Cardiovascular Disease

## 2017-11-11 VITALS — BP 194/74 | HR 85 | Ht 76.0 in | Wt 258.8 lb

## 2017-11-11 DIAGNOSIS — R0602 Shortness of breath: Secondary | ICD-10-CM | POA: Diagnosis not present

## 2017-11-11 DIAGNOSIS — R079 Chest pain, unspecified: Secondary | ICD-10-CM

## 2017-11-11 NOTE — Patient Instructions (Addendum)
Medication Instructions:  Your physician recommends that you continue on your current medications as directed. Please refer to the Current Medication list given to you today.  Labwork: NONE  Testing/Procedures: Your physician has requested that you have an echocardiogram. Echocardiography is a painless test that uses sound waves to create images of your heart. It provides your doctor with information about the size and shape of your heart and how well your heart's chambers and valves are working. This procedure takes approximately one hour. There are no restrictions for this procedure.  Your physician has requested that you have a lexiscan myoview. For further information please visit [email or web address]. Please follow instruction sheet, as given.   Follow-Up: Your physician wants you to follow-up as needed with Dr. Johnsie Cancel.   If you need a refill on your cardiac medications before your next appointment, please call your pharmacy.

## 2017-11-15 ENCOUNTER — Encounter (HOSPITAL_BASED_OUTPATIENT_CLINIC_OR_DEPARTMENT_OTHER): Payer: PPO | Attending: Internal Medicine

## 2017-11-15 DIAGNOSIS — I87332 Chronic venous hypertension (idiopathic) with ulcer and inflammation of left lower extremity: Secondary | ICD-10-CM | POA: Insufficient documentation

## 2017-11-15 DIAGNOSIS — Z87891 Personal history of nicotine dependence: Secondary | ICD-10-CM | POA: Insufficient documentation

## 2017-11-15 DIAGNOSIS — L97322 Non-pressure chronic ulcer of left ankle with fat layer exposed: Secondary | ICD-10-CM | POA: Insufficient documentation

## 2017-11-15 DIAGNOSIS — I10 Essential (primary) hypertension: Secondary | ICD-10-CM | POA: Diagnosis not present

## 2017-11-15 DIAGNOSIS — L89620 Pressure ulcer of left heel, unstageable: Secondary | ICD-10-CM | POA: Diagnosis not present

## 2017-11-15 DIAGNOSIS — I87312 Chronic venous hypertension (idiopathic) with ulcer of left lower extremity: Secondary | ICD-10-CM | POA: Diagnosis not present

## 2017-11-17 DIAGNOSIS — Z8781 Personal history of (healed) traumatic fracture: Secondary | ICD-10-CM | POA: Diagnosis not present

## 2017-11-17 DIAGNOSIS — M79674 Pain in right toe(s): Secondary | ICD-10-CM | POA: Diagnosis not present

## 2017-11-17 DIAGNOSIS — M19071 Primary osteoarthritis, right ankle and foot: Secondary | ICD-10-CM | POA: Diagnosis not present

## 2017-11-17 DIAGNOSIS — M25871 Other specified joint disorders, right ankle and foot: Secondary | ICD-10-CM | POA: Diagnosis not present

## 2017-11-17 DIAGNOSIS — Z888 Allergy status to other drugs, medicaments and biological substances status: Secondary | ICD-10-CM | POA: Diagnosis not present

## 2017-11-17 DIAGNOSIS — M2031 Hallux varus (acquired), right foot: Secondary | ICD-10-CM | POA: Diagnosis not present

## 2017-11-17 DIAGNOSIS — S92511D Displaced fracture of proximal phalanx of right lesser toe(s), subsequent encounter for fracture with routine healing: Secondary | ICD-10-CM | POA: Diagnosis not present

## 2017-11-17 DIAGNOSIS — M2011 Hallux valgus (acquired), right foot: Secondary | ICD-10-CM | POA: Diagnosis not present

## 2017-11-17 DIAGNOSIS — S93311A Subluxation of tarsal joint of right foot, initial encounter: Secondary | ICD-10-CM | POA: Diagnosis not present

## 2017-11-17 DIAGNOSIS — S92911D Unspecified fracture of right toe(s), subsequent encounter for fracture with routine healing: Secondary | ICD-10-CM | POA: Diagnosis not present

## 2017-11-17 DIAGNOSIS — M85871 Other specified disorders of bone density and structure, right ankle and foot: Secondary | ICD-10-CM | POA: Diagnosis not present

## 2017-11-17 DIAGNOSIS — M79671 Pain in right foot: Secondary | ICD-10-CM | POA: Diagnosis not present

## 2017-11-17 DIAGNOSIS — M2141 Flat foot [pes planus] (acquired), right foot: Secondary | ICD-10-CM | POA: Diagnosis not present

## 2017-11-17 DIAGNOSIS — Z981 Arthrodesis status: Secondary | ICD-10-CM | POA: Diagnosis not present

## 2017-11-22 DIAGNOSIS — I87312 Chronic venous hypertension (idiopathic) with ulcer of left lower extremity: Secondary | ICD-10-CM | POA: Diagnosis not present

## 2017-11-22 DIAGNOSIS — L97322 Non-pressure chronic ulcer of left ankle with fat layer exposed: Secondary | ICD-10-CM | POA: Diagnosis not present

## 2017-11-22 DIAGNOSIS — I87332 Chronic venous hypertension (idiopathic) with ulcer and inflammation of left lower extremity: Secondary | ICD-10-CM | POA: Diagnosis not present

## 2017-11-24 DIAGNOSIS — S29011D Strain of muscle and tendon of front wall of thorax, subsequent encounter: Secondary | ICD-10-CM | POA: Diagnosis not present

## 2017-11-24 DIAGNOSIS — M5412 Radiculopathy, cervical region: Secondary | ICD-10-CM | POA: Insufficient documentation

## 2017-11-24 DIAGNOSIS — I10 Essential (primary) hypertension: Secondary | ICD-10-CM | POA: Diagnosis not present

## 2017-11-24 DIAGNOSIS — N289 Disorder of kidney and ureter, unspecified: Secondary | ICD-10-CM | POA: Diagnosis not present

## 2017-11-28 ENCOUNTER — Other Ambulatory Visit: Payer: Self-pay | Admitting: Orthopaedic Surgery

## 2017-11-28 DIAGNOSIS — M79671 Pain in right foot: Secondary | ICD-10-CM

## 2017-11-29 ENCOUNTER — Telehealth (HOSPITAL_COMMUNITY): Payer: Self-pay | Admitting: *Deleted

## 2017-11-29 DIAGNOSIS — I87312 Chronic venous hypertension (idiopathic) with ulcer of left lower extremity: Secondary | ICD-10-CM | POA: Diagnosis not present

## 2017-11-29 DIAGNOSIS — I87332 Chronic venous hypertension (idiopathic) with ulcer and inflammation of left lower extremity: Secondary | ICD-10-CM | POA: Diagnosis not present

## 2017-11-29 DIAGNOSIS — L97322 Non-pressure chronic ulcer of left ankle with fat layer exposed: Secondary | ICD-10-CM | POA: Diagnosis not present

## 2017-11-29 DIAGNOSIS — L89620 Pressure ulcer of left heel, unstageable: Secondary | ICD-10-CM | POA: Diagnosis not present

## 2017-11-29 NOTE — Telephone Encounter (Signed)
Patient's wife was given detailed instructions per Myocardial Perfusion Study Information Sheet for the test on 12/01/17 at 0715. Patient notified to arrive 15 minutes early and that it is imperative to arrive on time for appointment to keep from having the test rescheduled.  If you need to cancel or reschedule your appointment, please call the office within 24 hours of your appointment. . Patient verbalized understanding.Miyani Cronic, Ranae Palms

## 2017-11-30 ENCOUNTER — Ambulatory Visit
Admission: RE | Admit: 2017-11-30 | Discharge: 2017-11-30 | Disposition: A | Discharge status: Home / Self Care | Payer: PPO | Source: Ambulatory Visit | Attending: Orthopaedic Surgery | Admitting: Orthopaedic Surgery

## 2017-11-30 DIAGNOSIS — M19071 Primary osteoarthritis, right ankle and foot: Secondary | ICD-10-CM | POA: Diagnosis not present

## 2017-11-30 DIAGNOSIS — M79671 Pain in right foot: Secondary | ICD-10-CM

## 2017-12-01 ENCOUNTER — Other Ambulatory Visit: Payer: Self-pay | Admitting: Family Medicine

## 2017-12-01 ENCOUNTER — Ambulatory Visit (HOSPITAL_BASED_OUTPATIENT_CLINIC_OR_DEPARTMENT_OTHER): Payer: PPO

## 2017-12-01 ENCOUNTER — Ambulatory Visit (HOSPITAL_COMMUNITY): Payer: PPO | Attending: Cardiovascular Disease

## 2017-12-01 ENCOUNTER — Other Ambulatory Visit: Payer: Self-pay

## 2017-12-01 DIAGNOSIS — R0602 Shortness of breath: Secondary | ICD-10-CM

## 2017-12-01 DIAGNOSIS — M5410 Radiculopathy, site unspecified: Secondary | ICD-10-CM

## 2017-12-01 DIAGNOSIS — R079 Chest pain, unspecified: Secondary | ICD-10-CM | POA: Diagnosis not present

## 2017-12-01 DIAGNOSIS — M5412 Radiculopathy, cervical region: Secondary | ICD-10-CM

## 2017-12-01 LAB — MYOCARDIAL PERFUSION IMAGING
CHL CUP NUCLEAR SSS: 4
CSEPPHR: 90 {beats}/min
LVDIAVOL: 108 mL (ref 62–150)
LVSYSVOL: 40 mL
RATE: 0.29
Rest HR: 76 {beats}/min
SDS: 3
SRS: 1
TID: 0.9

## 2017-12-01 MED ORDER — TECHNETIUM TC 99M TETROFOSMIN IV KIT
10.9000 | PACK | Freq: Once | INTRAVENOUS | Status: AC | PRN
  Administered 2017-12-01: 10.9 via INTRAVENOUS
  Filled 2017-12-01: qty 11

## 2017-12-01 MED ORDER — TECHNETIUM TC 99M TETROFOSMIN IV KIT
30.7000 | PACK | Freq: Once | INTRAVENOUS | Status: AC | PRN
  Administered 2017-12-01: 30.7 via INTRAVENOUS
  Filled 2017-12-01: qty 31

## 2017-12-01 MED ORDER — REGADENOSON 0.4 MG/5ML IV SOLN
0.4000 mg | Freq: Once | INTRAVENOUS | Status: AC
  Administered 2017-12-01: 0.4 mg via INTRAVENOUS

## 2017-12-02 DIAGNOSIS — M19071 Primary osteoarthritis, right ankle and foot: Secondary | ICD-10-CM | POA: Diagnosis not present

## 2017-12-02 DIAGNOSIS — Z9889 Other specified postprocedural states: Secondary | ICD-10-CM | POA: Diagnosis not present

## 2017-12-02 DIAGNOSIS — M19079 Primary osteoarthritis, unspecified ankle and foot: Secondary | ICD-10-CM | POA: Diagnosis not present

## 2017-12-02 DIAGNOSIS — Z4789 Encounter for other orthopedic aftercare: Secondary | ICD-10-CM | POA: Diagnosis not present

## 2017-12-04 ENCOUNTER — Ambulatory Visit
Admission: RE | Admit: 2017-12-04 | Discharge: 2017-12-04 | Disposition: A | Discharge status: Home / Self Care | Payer: PPO | Source: Ambulatory Visit | Attending: Family Medicine | Admitting: Family Medicine

## 2017-12-04 DIAGNOSIS — M5410 Radiculopathy, site unspecified: Secondary | ICD-10-CM

## 2017-12-04 DIAGNOSIS — M5412 Radiculopathy, cervical region: Secondary | ICD-10-CM

## 2017-12-04 DIAGNOSIS — M4802 Spinal stenosis, cervical region: Secondary | ICD-10-CM | POA: Diagnosis not present

## 2017-12-04 DIAGNOSIS — M25511 Pain in right shoulder: Secondary | ICD-10-CM | POA: Diagnosis not present

## 2017-12-06 DIAGNOSIS — L89620 Pressure ulcer of left heel, unstageable: Secondary | ICD-10-CM | POA: Diagnosis not present

## 2017-12-06 DIAGNOSIS — I87332 Chronic venous hypertension (idiopathic) with ulcer and inflammation of left lower extremity: Secondary | ICD-10-CM | POA: Diagnosis not present

## 2017-12-06 DIAGNOSIS — L97322 Non-pressure chronic ulcer of left ankle with fat layer exposed: Secondary | ICD-10-CM | POA: Diagnosis not present

## 2017-12-15 ENCOUNTER — Encounter (HOSPITAL_BASED_OUTPATIENT_CLINIC_OR_DEPARTMENT_OTHER): Payer: PPO | Attending: Internal Medicine

## 2017-12-15 DIAGNOSIS — S46011A Strain of muscle(s) and tendon(s) of the rotator cuff of right shoulder, initial encounter: Secondary | ICD-10-CM | POA: Diagnosis not present

## 2017-12-15 DIAGNOSIS — L97322 Non-pressure chronic ulcer of left ankle with fat layer exposed: Secondary | ICD-10-CM | POA: Insufficient documentation

## 2017-12-15 DIAGNOSIS — M75121 Complete rotator cuff tear or rupture of right shoulder, not specified as traumatic: Secondary | ICD-10-CM | POA: Diagnosis not present

## 2017-12-15 DIAGNOSIS — S46111A Strain of muscle, fascia and tendon of long head of biceps, right arm, initial encounter: Secondary | ICD-10-CM | POA: Diagnosis not present

## 2017-12-15 DIAGNOSIS — I10 Essential (primary) hypertension: Secondary | ICD-10-CM | POA: Insufficient documentation

## 2017-12-15 DIAGNOSIS — I87332 Chronic venous hypertension (idiopathic) with ulcer and inflammation of left lower extremity: Secondary | ICD-10-CM | POA: Insufficient documentation

## 2017-12-15 DIAGNOSIS — I87312 Chronic venous hypertension (idiopathic) with ulcer of left lower extremity: Secondary | ICD-10-CM | POA: Diagnosis not present

## 2017-12-29 DIAGNOSIS — I87312 Chronic venous hypertension (idiopathic) with ulcer of left lower extremity: Secondary | ICD-10-CM | POA: Diagnosis not present

## 2017-12-29 DIAGNOSIS — L97322 Non-pressure chronic ulcer of left ankle with fat layer exposed: Secondary | ICD-10-CM | POA: Diagnosis not present

## 2017-12-29 DIAGNOSIS — I87332 Chronic venous hypertension (idiopathic) with ulcer and inflammation of left lower extremity: Secondary | ICD-10-CM | POA: Diagnosis not present

## 2018-01-04 DIAGNOSIS — L89620 Pressure ulcer of left heel, unstageable: Secondary | ICD-10-CM | POA: Diagnosis not present

## 2018-01-13 ENCOUNTER — Encounter (HOSPITAL_BASED_OUTPATIENT_CLINIC_OR_DEPARTMENT_OTHER): Payer: PPO | Attending: Internal Medicine

## 2018-01-13 DIAGNOSIS — M79671 Pain in right foot: Secondary | ICD-10-CM | POA: Diagnosis not present

## 2018-01-13 DIAGNOSIS — X58XXXA Exposure to other specified factors, initial encounter: Secondary | ICD-10-CM | POA: Diagnosis not present

## 2018-01-13 DIAGNOSIS — S81012A Laceration without foreign body, left knee, initial encounter: Secondary | ICD-10-CM | POA: Insufficient documentation

## 2018-01-13 DIAGNOSIS — M2011 Hallux valgus (acquired), right foot: Secondary | ICD-10-CM | POA: Diagnosis not present

## 2018-01-13 DIAGNOSIS — I10 Essential (primary) hypertension: Secondary | ICD-10-CM | POA: Insufficient documentation

## 2018-01-13 DIAGNOSIS — I87332 Chronic venous hypertension (idiopathic) with ulcer and inflammation of left lower extremity: Secondary | ICD-10-CM | POA: Diagnosis not present

## 2018-01-13 DIAGNOSIS — M7731 Calcaneal spur, right foot: Secondary | ICD-10-CM | POA: Diagnosis not present

## 2018-01-13 DIAGNOSIS — Z981 Arthrodesis status: Secondary | ICD-10-CM | POA: Diagnosis not present

## 2018-01-13 DIAGNOSIS — S92511D Displaced fracture of proximal phalanx of right lesser toe(s), subsequent encounter for fracture with routine healing: Secondary | ICD-10-CM | POA: Diagnosis not present

## 2018-01-13 DIAGNOSIS — I87312 Chronic venous hypertension (idiopathic) with ulcer of left lower extremity: Secondary | ICD-10-CM | POA: Diagnosis not present

## 2018-01-13 DIAGNOSIS — M19071 Primary osteoarthritis, right ankle and foot: Secondary | ICD-10-CM | POA: Diagnosis not present

## 2018-01-13 DIAGNOSIS — M2141 Flat foot [pes planus] (acquired), right foot: Secondary | ICD-10-CM | POA: Diagnosis not present

## 2018-01-13 DIAGNOSIS — L97322 Non-pressure chronic ulcer of left ankle with fat layer exposed: Secondary | ICD-10-CM | POA: Diagnosis not present

## 2018-01-13 DIAGNOSIS — M85871 Other specified disorders of bone density and structure, right ankle and foot: Secondary | ICD-10-CM | POA: Diagnosis not present

## 2018-01-18 ENCOUNTER — Ambulatory Visit: Payer: PPO | Admitting: Neurology

## 2018-01-19 DIAGNOSIS — M75101 Unspecified rotator cuff tear or rupture of right shoulder, not specified as traumatic: Secondary | ICD-10-CM | POA: Insufficient documentation

## 2018-01-19 DIAGNOSIS — L89620 Pressure ulcer of left heel, unstageable: Secondary | ICD-10-CM | POA: Diagnosis not present

## 2018-01-19 DIAGNOSIS — S46011A Strain of muscle(s) and tendon(s) of the rotator cuff of right shoulder, initial encounter: Secondary | ICD-10-CM | POA: Diagnosis not present

## 2018-01-20 DIAGNOSIS — I87312 Chronic venous hypertension (idiopathic) with ulcer of left lower extremity: Secondary | ICD-10-CM | POA: Diagnosis not present

## 2018-01-20 DIAGNOSIS — I87332 Chronic venous hypertension (idiopathic) with ulcer and inflammation of left lower extremity: Secondary | ICD-10-CM | POA: Diagnosis not present

## 2018-01-20 DIAGNOSIS — L97322 Non-pressure chronic ulcer of left ankle with fat layer exposed: Secondary | ICD-10-CM | POA: Diagnosis not present

## 2018-01-25 DIAGNOSIS — M24111 Other articular cartilage disorders, right shoulder: Secondary | ICD-10-CM | POA: Diagnosis not present

## 2018-01-25 DIAGNOSIS — G8918 Other acute postprocedural pain: Secondary | ICD-10-CM | POA: Diagnosis not present

## 2018-01-25 DIAGNOSIS — M7541 Impingement syndrome of right shoulder: Secondary | ICD-10-CM | POA: Diagnosis not present

## 2018-01-25 DIAGNOSIS — S43421A Sprain of right rotator cuff capsule, initial encounter: Secondary | ICD-10-CM | POA: Diagnosis not present

## 2018-01-25 DIAGNOSIS — M94211 Chondromalacia, right shoulder: Secondary | ICD-10-CM | POA: Diagnosis not present

## 2018-01-25 DIAGNOSIS — M19011 Primary osteoarthritis, right shoulder: Secondary | ICD-10-CM | POA: Diagnosis not present

## 2018-01-25 DIAGNOSIS — M75121 Complete rotator cuff tear or rupture of right shoulder, not specified as traumatic: Secondary | ICD-10-CM | POA: Diagnosis not present

## 2018-01-31 DIAGNOSIS — Z9889 Other specified postprocedural states: Secondary | ICD-10-CM | POA: Insufficient documentation

## 2018-02-02 DIAGNOSIS — L97322 Non-pressure chronic ulcer of left ankle with fat layer exposed: Secondary | ICD-10-CM | POA: Diagnosis not present

## 2018-02-02 DIAGNOSIS — I87332 Chronic venous hypertension (idiopathic) with ulcer and inflammation of left lower extremity: Secondary | ICD-10-CM | POA: Diagnosis not present

## 2018-02-02 DIAGNOSIS — I87312 Chronic venous hypertension (idiopathic) with ulcer of left lower extremity: Secondary | ICD-10-CM | POA: Diagnosis not present

## 2018-02-08 DIAGNOSIS — Z4789 Encounter for other orthopedic aftercare: Secondary | ICD-10-CM | POA: Diagnosis not present

## 2018-02-08 DIAGNOSIS — Z9181 History of falling: Secondary | ICD-10-CM | POA: Diagnosis not present

## 2018-02-08 DIAGNOSIS — Z96651 Presence of right artificial knee joint: Secondary | ICD-10-CM | POA: Diagnosis not present

## 2018-02-08 DIAGNOSIS — L97828 Non-pressure chronic ulcer of other part of left lower leg with other specified severity: Secondary | ICD-10-CM | POA: Diagnosis not present

## 2018-02-08 DIAGNOSIS — F329 Major depressive disorder, single episode, unspecified: Secondary | ICD-10-CM | POA: Diagnosis not present

## 2018-02-08 DIAGNOSIS — I10 Essential (primary) hypertension: Secondary | ICD-10-CM | POA: Diagnosis not present

## 2018-02-08 DIAGNOSIS — I872 Venous insufficiency (chronic) (peripheral): Secondary | ICD-10-CM | POA: Diagnosis not present

## 2018-02-10 DIAGNOSIS — I87332 Chronic venous hypertension (idiopathic) with ulcer and inflammation of left lower extremity: Secondary | ICD-10-CM | POA: Diagnosis not present

## 2018-02-10 DIAGNOSIS — L97322 Non-pressure chronic ulcer of left ankle with fat layer exposed: Secondary | ICD-10-CM | POA: Diagnosis not present

## 2018-02-10 DIAGNOSIS — I87312 Chronic venous hypertension (idiopathic) with ulcer of left lower extremity: Secondary | ICD-10-CM | POA: Diagnosis not present

## 2018-02-15 ENCOUNTER — Encounter: Payer: Self-pay | Admitting: *Deleted

## 2018-02-15 ENCOUNTER — Other Ambulatory Visit: Payer: Self-pay | Admitting: *Deleted

## 2018-02-15 DIAGNOSIS — Z4789 Encounter for other orthopedic aftercare: Secondary | ICD-10-CM | POA: Diagnosis not present

## 2018-02-15 DIAGNOSIS — Z96651 Presence of right artificial knee joint: Secondary | ICD-10-CM | POA: Diagnosis not present

## 2018-02-15 DIAGNOSIS — I872 Venous insufficiency (chronic) (peripheral): Secondary | ICD-10-CM | POA: Diagnosis not present

## 2018-02-15 DIAGNOSIS — F329 Major depressive disorder, single episode, unspecified: Secondary | ICD-10-CM | POA: Diagnosis not present

## 2018-02-15 DIAGNOSIS — Z9181 History of falling: Secondary | ICD-10-CM | POA: Diagnosis not present

## 2018-02-15 DIAGNOSIS — I10 Essential (primary) hypertension: Secondary | ICD-10-CM | POA: Diagnosis not present

## 2018-02-15 DIAGNOSIS — L97828 Non-pressure chronic ulcer of other part of left lower leg with other specified severity: Secondary | ICD-10-CM | POA: Diagnosis not present

## 2018-02-15 NOTE — Patient Outreach (Signed)
[facility]) Care Management  02/15/2018  GOLDIE DIMMER [birth year] [id number]  Referral from HTA concierge: Reason: Need transportation assistance for PT and wound care after upcoming surgery:  Telephone call to patient who was advised of reason for call and of [facility] care management services. Received HIPPA verification from patient.  Patient voices that he has no transportation needs at this time. States spouse will provide transportation to all health related services.    States he has no health care concerns at this time. Voices blood pressure is under control and he takes medications consistently as prescribed by his doctors. Patient voices understanding of importance of taking medication as prescribed. Patient states he is a former [employer] transport [employer].   Plan: Send [facility] contact information   Case closure.  Sherrin Daisy, RN BSN [place] Management Coordinator [facility] Care Management  [phone]

## 2018-02-17 ENCOUNTER — Encounter (HOSPITAL_BASED_OUTPATIENT_CLINIC_OR_DEPARTMENT_OTHER): Payer: PPO | Attending: Internal Medicine

## 2018-02-17 DIAGNOSIS — L97322 Non-pressure chronic ulcer of left ankle with fat layer exposed: Secondary | ICD-10-CM | POA: Insufficient documentation

## 2018-02-17 DIAGNOSIS — I87332 Chronic venous hypertension (idiopathic) with ulcer and inflammation of left lower extremity: Secondary | ICD-10-CM | POA: Insufficient documentation

## 2018-02-17 DIAGNOSIS — I10 Essential (primary) hypertension: Secondary | ICD-10-CM | POA: Diagnosis not present

## 2018-02-17 DIAGNOSIS — I87312 Chronic venous hypertension (idiopathic) with ulcer of left lower extremity: Secondary | ICD-10-CM | POA: Diagnosis not present

## 2018-02-20 DIAGNOSIS — Z96651 Presence of right artificial knee joint: Secondary | ICD-10-CM | POA: Diagnosis not present

## 2018-02-20 DIAGNOSIS — I872 Venous insufficiency (chronic) (peripheral): Secondary | ICD-10-CM | POA: Diagnosis not present

## 2018-02-20 DIAGNOSIS — I10 Essential (primary) hypertension: Secondary | ICD-10-CM | POA: Diagnosis not present

## 2018-02-20 DIAGNOSIS — Z9181 History of falling: Secondary | ICD-10-CM | POA: Diagnosis not present

## 2018-02-20 DIAGNOSIS — F329 Major depressive disorder, single episode, unspecified: Secondary | ICD-10-CM | POA: Diagnosis not present

## 2018-02-20 DIAGNOSIS — L97828 Non-pressure chronic ulcer of other part of left lower leg with other specified severity: Secondary | ICD-10-CM | POA: Diagnosis not present

## 2018-02-20 DIAGNOSIS — Z4789 Encounter for other orthopedic aftercare: Secondary | ICD-10-CM | POA: Diagnosis not present

## 2018-02-24 DIAGNOSIS — I87332 Chronic venous hypertension (idiopathic) with ulcer and inflammation of left lower extremity: Secondary | ICD-10-CM | POA: Diagnosis not present

## 2018-02-24 DIAGNOSIS — L97322 Non-pressure chronic ulcer of left ankle with fat layer exposed: Secondary | ICD-10-CM | POA: Diagnosis not present

## 2018-02-24 DIAGNOSIS — I87312 Chronic venous hypertension (idiopathic) with ulcer of left lower extremity: Secondary | ICD-10-CM | POA: Diagnosis not present

## 2018-03-06 DIAGNOSIS — L89620 Pressure ulcer of left heel, unstageable: Secondary | ICD-10-CM | POA: Diagnosis not present

## 2018-03-09 ENCOUNTER — Ambulatory Visit (INDEPENDENT_AMBULATORY_CARE_PROVIDER_SITE_OTHER): Payer: PPO | Admitting: Neurology

## 2018-03-09 ENCOUNTER — Encounter: Payer: Self-pay | Admitting: Neurology

## 2018-03-09 ENCOUNTER — Telehealth: Payer: Self-pay | Admitting: Neurology

## 2018-03-09 VITALS — BP 144/76 | HR 95 | Ht 76.0 in | Wt 255.0 lb

## 2018-03-09 DIAGNOSIS — R269 Unspecified abnormalities of gait and mobility: Secondary | ICD-10-CM

## 2018-03-09 DIAGNOSIS — R29818 Other symptoms and signs involving the nervous system: Secondary | ICD-10-CM | POA: Diagnosis not present

## 2018-03-09 DIAGNOSIS — E538 Deficiency of other specified B group vitamins: Secondary | ICD-10-CM
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
## 2018-03-09 NOTE — Patient Instructions (Signed)
   We will get an AFO brace for the left foot, and get MRI of the brain and EMG and NCV study to look at the nerve function of the legs.

## 2018-03-09 NOTE — Telephone Encounter (Signed)
Health team order sent to GI. No auth per their website they will reach out to the pt to schedule.

## 2018-03-10 DIAGNOSIS — I87312 Chronic venous hypertension (idiopathic) with ulcer of left lower extremity: Secondary | ICD-10-CM | POA: Diagnosis not present

## 2018-03-10 DIAGNOSIS — L97322 Non-pressure chronic ulcer of left ankle with fat layer exposed: Secondary | ICD-10-CM | POA: Diagnosis not present

## 2018-03-10 DIAGNOSIS — I87332 Chronic venous hypertension (idiopathic) with ulcer and inflammation of left lower extremity: Secondary | ICD-10-CM | POA: Diagnosis not present

## 2018-03-12 LAB — MULTIPLE MYELOMA PANEL, SERUM
ALPHA 1: 0.3 g/dL (ref 0.0–0.4)
Albumin SerPl Elph-Mcnc: 3.6 g/dL (ref 2.9–4.4)
Albumin/Glob SerPl: 1.4 (ref 0.7–1.7)
Alpha2 Glob SerPl Elph-Mcnc: 0.7 g/dL (ref 0.4–1.0)
B-Globulin SerPl Elph-Mcnc: 1 g/dL (ref 0.7–1.3)
Gamma Glob SerPl Elph-Mcnc: 0.6 g/dL (ref 0.4–1.8)
Globulin, Total: 2.6 g/dL (ref 2.2–3.9)
IGA/IMMUNOGLOBULIN A, SERUM: 172 mg/dL (ref 61–437)
IGG (IMMUNOGLOBIN G), SERUM: 719 mg/dL (ref 700–1600)
IGM (IMMUNOGLOBULIN M), SRM: 64 mg/dL (ref 20–172)
Total Protein: 6.2 g/dL (ref 6.0–8.5)

## 2018-03-12 LAB — ANGIOTENSIN CONVERTING ENZYME: ANGIO CONVERT ENZYME: 70 U/L (ref 14–82)

## 2018-03-12 LAB — ANA W/REFLEX: Anti Nuclear Antibody(ANA): NEGATIVE

## 2018-03-12 LAB — VITAMIN B12: Vitamin B-12: 414 pg/mL (ref 232–1245)

## 2018-03-12 LAB — SEDIMENTATION RATE: SED RATE: 14 mm/h (ref 0–30)

## 2018-03-12 LAB — B. BURGDORFI ANTIBODIES: Lyme IgG/IgM Ab: <0.91 {ISR} (ref 0.00–0.90)

## 2018-03-13 ENCOUNTER — Telehealth: Payer: Self-pay | Admitting: *Deleted

## 2018-03-13 DIAGNOSIS — M25611 Stiffness of right shoulder, not elsewhere classified: Secondary | ICD-10-CM | POA: Diagnosis not present

## 2018-03-13 DIAGNOSIS — R29898 Other symptoms and signs involving the musculoskeletal system: Secondary | ICD-10-CM | POA: Diagnosis not present

## 2018-03-13 DIAGNOSIS — Z9889 Other specified postprocedural states: Secondary | ICD-10-CM | POA: Diagnosis not present

## 2018-03-13 NOTE — Telephone Encounter (Addendum)
Called and spoke with wife, Shirlean Mylar (on [place]) about unremarkable labs per CW,MD note. She verbalized understanding and appreciation for call.

## 2018-03-13 NOTE — Telephone Encounter (Signed)
-----   Message from Kathrynn Ducking, MD sent at 03/12/2018  5:53 PM EDT -----  The blood work results are unremarkable. Please call the patient.  ----- Message ----- From: Lavone Neri Lab Results In Sent: 03/10/2018   7:39 AM To: Kathrynn Ducking, MD

## 2018-03-19 ENCOUNTER — Ambulatory Visit
Admission: RE | Admit: 2018-03-19 | Discharge: 2018-03-19 | Disposition: A | Discharge status: Home / Self Care | Payer: PPO | Source: Ambulatory Visit | Attending: Neurology | Admitting: Neurology

## 2018-03-19 DIAGNOSIS — R29818 Other symptoms and signs involving the nervous system: Secondary | ICD-10-CM

## 2018-03-19 DIAGNOSIS — R269 Unspecified abnormalities of gait and mobility: Secondary | ICD-10-CM | POA: Diagnosis not present

## 2018-03-20 ENCOUNTER — Encounter (HOSPITAL_BASED_OUTPATIENT_CLINIC_OR_DEPARTMENT_OTHER): Payer: PPO | Attending: Internal Medicine

## 2018-03-20 ENCOUNTER — Telehealth: Payer: Self-pay | Admitting: Neurology

## 2018-03-20 DIAGNOSIS — I10 Essential (primary) hypertension: Secondary | ICD-10-CM | POA: Insufficient documentation

## 2018-03-20 DIAGNOSIS — I87312 Chronic venous hypertension (idiopathic) with ulcer of left lower extremity: Secondary | ICD-10-CM | POA: Diagnosis not present

## 2018-03-20 DIAGNOSIS — L97322 Non-pressure chronic ulcer of left ankle with fat layer exposed: Secondary | ICD-10-CM | POA: Diagnosis not present

## 2018-03-20 DIAGNOSIS — I87332 Chronic venous hypertension (idiopathic) with ulcer and inflammation of left lower extremity: Secondary | ICD-10-CM | POA: Diagnosis not present
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
## 2018-03-21 ENCOUNTER — Encounter (HOSPITAL_BASED_OUTPATIENT_CLINIC_OR_DEPARTMENT_OTHER): Payer: PPO

## 2018-03-23 DIAGNOSIS — R29898 Other symptoms and signs involving the musculoskeletal system: Secondary | ICD-10-CM | POA: Diagnosis not present

## 2018-03-23 DIAGNOSIS — M25611 Stiffness of right shoulder, not elsewhere classified: Secondary | ICD-10-CM | POA: Diagnosis not present

## 2018-03-23 DIAGNOSIS — Z9889 Other specified postprocedural states: Secondary | ICD-10-CM | POA: Diagnosis not present

## 2018-03-29 ENCOUNTER — Emergency Department (HOSPITAL_BASED_OUTPATIENT_CLINIC_OR_DEPARTMENT_OTHER)
Admit: 2018-03-29 | Discharge: 2018-03-29 | Disposition: A | Discharge status: Home / Self Care | Payer: PPO | Attending: Emergency Medicine | Admitting: Emergency Medicine

## 2018-03-29 ENCOUNTER — Encounter (HOSPITAL_COMMUNITY): Payer: Self-pay | Admitting: Emergency Medicine

## 2018-03-29 ENCOUNTER — Other Ambulatory Visit: Payer: Self-pay

## 2018-03-29 ENCOUNTER — Emergency Department (HOSPITAL_COMMUNITY): Payer: PPO

## 2018-03-29 ENCOUNTER — Emergency Department (HOSPITAL_COMMUNITY)
Admission: EM | Admit: 2018-03-29 | Discharge: 2018-03-30 | Disposition: A | Discharge status: Home / Self Care | Payer: PPO | Attending: Emergency Medicine | Admitting: Emergency Medicine

## 2018-03-29 DIAGNOSIS — Z87891 Personal history of nicotine dependence: Secondary | ICD-10-CM | POA: Insufficient documentation

## 2018-03-29 DIAGNOSIS — M79609 Pain in unspecified limb: Secondary | ICD-10-CM

## 2018-03-29 DIAGNOSIS — M79671 Pain in right foot: Secondary | ICD-10-CM | POA: Diagnosis not present

## 2018-03-29 DIAGNOSIS — I10 Essential (primary) hypertension: Secondary | ICD-10-CM | POA: Diagnosis not present

## 2018-03-29 DIAGNOSIS — R05 Cough: Secondary | ICD-10-CM | POA: Diagnosis not present

## 2018-03-29 DIAGNOSIS — R600 Localized edema: Secondary | ICD-10-CM | POA: Diagnosis not present

## 2018-03-29 DIAGNOSIS — M7989 Other specified soft tissue disorders: Secondary | ICD-10-CM

## 2018-03-29 DIAGNOSIS — Z79899 Other long term (current) drug therapy: Secondary | ICD-10-CM | POA: Diagnosis not present

## 2018-03-29 DIAGNOSIS — R0602 Shortness of breath: Secondary | ICD-10-CM | POA: Diagnosis not present

## 2018-03-29 LAB — CBC WITH DIFFERENTIAL/PLATELET
Abs Immature Granulocytes: 0.2 10*3/uL — ABNORMAL HIGH (ref 0.0–0.1)
BASOS PCT: 1 %
Basophils Absolute: 0.1 10*3/uL (ref 0.0–0.1)
EOS ABS: 0.4 10*3/uL (ref 0.0–0.7)
Eosinophils Relative: 3 %
HCT: 29.6 % — ABNORMAL LOW (ref 39.0–52.0)
Hemoglobin: 9.1 g/dL — ABNORMAL LOW (ref 13.0–17.0)
Immature Granulocytes: 2 %
Lymphocytes Relative: 10 %
Lymphs Abs: 1.4 10*3/uL (ref 0.7–4.0)
MCH: 30.2 pg (ref 26.0–34.0)
MCHC: 30.7 g/dL (ref 30.0–36.0)
MCV: 98.3 fL (ref 78.0–100.0)
MONO ABS: 2 10*3/uL — AB (ref 0.1–1.0)
MONOS PCT: 14 %
Neutro Abs: 9.8 10*3/uL — ABNORMAL HIGH (ref 1.7–7.7)
Neutrophils Relative %: 70 %
PLATELETS: 339 10*3/uL (ref 150–400)
RBC: 3.01 MIL/uL — ABNORMAL LOW (ref 4.22–5.81)
RDW: 13.8 % (ref 11.5–15.5)
WBC: 13.9 10*3/uL — ABNORMAL HIGH (ref 4.0–10.5)

## 2018-03-29 LAB — COMPREHENSIVE METABOLIC PANEL
ALBUMIN: 2.5 g/dL — AB (ref 3.5–5.0)
ALK PHOS: 185 U/L — AB (ref 38–126)
ALT: 19 U/L (ref 0–44)
ANION GAP: 11 (ref 5–15)
AST: 18 U/L (ref 15–41)
BUN: 20 mg/dL (ref 8–23)
CALCIUM: 8.6 mg/dL — AB (ref 8.9–10.3)
CO2: 20 mmol/L — AB (ref 22–32)
Chloride: 109 mmol/L (ref 98–111)
Creatinine, Ser: 1.51 mg/dL — ABNORMAL HIGH (ref 0.61–1.24)
GFR calc Af Amer: 54 mL/min — ABNORMAL LOW (ref >60)
GFR calc non Af Amer: 47 mL/min — ABNORMAL LOW (ref >60)
GLUCOSE: 142 mg/dL — AB (ref 70–99)
POTASSIUM: 3.8 mmol/L (ref 3.5–5.1)
SODIUM: 140 mmol/L (ref 135–145)
TOTAL PROTEIN: 6.1 g/dL — AB (ref 6.5–8.1)
Total Bilirubin: 1.1 mg/dL (ref 0.3–1.2)

## 2018-03-29 LAB — I-STAT CHEM 8, ED
BUN: 23 mg/dL (ref 8–23)
CALCIUM ION: 1.13 mmol/L — AB (ref 1.15–1.40)
CHLORIDE: 104 mmol/L (ref 98–111)
Creatinine, Ser: 1.5 mg/dL — ABNORMAL HIGH (ref 0.61–1.24)
GLUCOSE: 104 mg/dL — AB (ref 70–99)
HCT: 27 % — ABNORMAL LOW (ref 39.0–52.0)
HEMOGLOBIN: 9.2 g/dL — AB (ref 13.0–17.0)
Potassium: 3.7 mmol/L (ref 3.5–5.1)
Sodium: 138 mmol/L (ref 135–145)
TCO2: 24 mmol/L (ref 22–32)

## 2018-03-29 LAB — I-STAT CG4 LACTIC ACID, ED
Lactic Acid, Venous: 1.48 mmol/L (ref 0.5–1.9)
Lactic Acid, Venous: 0.77 mmol/L (ref 0.5–1.9)

## 2018-03-29 LAB — I-STAT TROPONIN, ED: Troponin i, poc: 0.01 ng/mL (ref 0.00–0.08)

## 2018-03-29 LAB — SEDIMENTATION RATE: Sed Rate: 115 mm/h — ABNORMAL HIGH (ref 0–16)

## 2018-03-29 LAB — C-REACTIVE PROTEIN: CRP: 24.2 mg/dL — AB (ref <1.0)

## 2018-03-29 MED ORDER — IOPAMIDOL (ISOVUE-370) INJECTION 76%
INTRAVENOUS | Status: AC
  Filled 2018-03-29: qty 100

## 2018-03-29 MED ORDER — IOPAMIDOL (ISOVUE-370) INJECTION 76%
100.0000 mL | Freq: Once | INTRAVENOUS | Status: AC | PRN
  Administered 2018-03-29: 100 mL via INTRAVENOUS

## 2018-03-29 MED ORDER — ALBUTEROL SULFATE (2.5 MG/3ML) 0.083% IN NEBU
5.0000 mg | INHALATION_SOLUTION | Freq: Once | RESPIRATORY_TRACT | Status: AC
  Administered 2018-03-29: 5 mg via RESPIRATORY_TRACT
  Filled 2018-03-29: qty 6

## 2018-03-29 MED ORDER — SODIUM CHLORIDE 0.9 % IV BOLUS
1000.0000 mL | Freq: Once | INTRAVENOUS | Status: AC
  Administered 2018-03-29: 1000 mL via INTRAVENOUS

## 2018-03-29 MED ORDER — HYDROMORPHONE HCL 1 MG/ML IJ SOLN
1.0000 mg | Freq: Once | INTRAMUSCULAR | Status: AC
  Administered 2018-03-29: 1 mg via INTRAVENOUS
  Filled 2018-03-29: qty 1

## 2018-03-29 MED ORDER — GADOBENATE DIMEGLUMINE 529 MG/ML IV SOLN: 20.0000 mL | Freq: Once | INTRAVENOUS | Status: DC | PRN

## 2018-03-29 MED ORDER — PIPERACILLIN-TAZOBACTAM 3.375 G IVPB
3.3750 g | Freq: Three times a day (TID) | INTRAVENOUS | Status: DC
  Administered 2018-03-29: 3.375 g via INTRAVENOUS
  Filled 2018-03-29: qty 50

## 2018-03-29 MED ORDER — PIPERACILLIN-TAZOBACTAM 3.375 G IVPB 30 MIN
3.3750 g | Freq: Once | INTRAVENOUS | Status: AC
  Administered 2018-03-29: 3.375 g via INTRAVENOUS
  Filled 2018-03-29: qty 50

## 2018-03-29 MED ORDER — IPRATROPIUM BROMIDE 0.02 % IN SOLN
0.5000 mg | Freq: Once | RESPIRATORY_TRACT | Status: AC
  Administered 2018-03-29: 0.5 mg via RESPIRATORY_TRACT
  Filled 2018-03-29: qty 2.5

## 2018-03-29 MED ORDER — ONDANSETRON HCL 4 MG/2ML IJ SOLN
4.0000 mg | Freq: Once | INTRAMUSCULAR | Status: AC
  Administered 2018-03-29: 4 mg via INTRAVENOUS
  Filled 2018-03-29: qty 2

## 2018-03-29 MED ORDER — VANCOMYCIN HCL IN DEXTROSE 750-5 MG/150ML-% IV SOLN
750.0000 mg | Freq: Two times a day (BID) | INTRAVENOUS | Status: DC
  Filled 2018-03-29: qty 150

## 2018-03-29 MED ORDER — VANCOMYCIN HCL 10 G IV SOLR
2500.0000 mg | Freq: Once | INTRAVENOUS | Status: AC
  Administered 2018-03-29: 2500 mg via INTRAVENOUS
  Filled 2018-03-29: qty 2500

## 2018-03-29 NOTE — Progress Notes (Signed)
Right lower extremity venous duplex completed. There is no evidence of a DVT or Baker's cyst. Multiple non thrombosed varicosities noted in the lower leg. Trevor [name],RVS 03/29/2018, 3:16 PM

## 2018-03-29 NOTE — ED Notes (Signed)
Pt remains in radiology 

## 2018-03-29 NOTE — Consult Note (Signed)
[facility]                                                                                    Patient Demographics  Trevor [name], is a 67 y.o. male  CSN: [id number]  MRN: [id number]  DOB - 07/28/1951  Admit Date - 03/29/2018  Outpatient Primary MD for the patient is Christain Sacramento, MD   With History of -  Past Medical History:  Diagnosis Date  . Acute pain of left shoulder 04/04/2017  . ADD (attention deficit disorder)   . Allergy   . Arthritis    right foot  . Arthritis of midfoot 04/29/2016   Overview:  Added automatically from request for surgery [id number]  . Blood transfusion without reported diagnosis   . Chest wall pain 09/29/2017  . Chronic pain of right ankle 04/08/2016  . Cough due to ACE inhibitor 03/18/2017  . Depression   . Descending thoracic aortic aneurysm ([place]) 04/04/2017   Overview:  4.5 cm on CT scan of April 04, 2017.  . Diverticulosis of colon 11/25/2015  . Dyspnea on exertion 04/04/2017  . Essential hypertension 11/25/2015  . Executive function deficit 12/31/2015  . Family history of colon cancer in mother    dx'd age late 73's  . Foot pain, right 04/08/2016  . H/O blood clots   . Health maintenance examination 09/29/2017  . History of colonic polyps 02/04/2011   3 10 mm hyperplastic polyps removed from right and left colon 2004 4 polyps, one 10 mm others diminutive and hyperplastic 2007 Followed closer than routine risk due to multiple large hyperplastic polyps 5 adenomas with one of them serrated  05/29/2015 11 polyps max 10 mm adenomas, ssa's, hyperplastic - 09/21/2017 3 diminutive polyps removed adenomas - recall 2021 (2 yrs)     . Hypercholesterolemia 11/25/2015  . Hyperlipidemia   . Hypertension   . Left foot pain 01/27/2016  . Nocturia more than twice per night 12/31/2015  . Pain in joints 12/31/2015  . Pectoralis major tendinitis, right 03/18/2017  . Pericarditis   . Pneumonia   . Primary osteoarthritis of right foot 11/14/2015  . Shortness of  breath 09/29/2017  . Status post right foot surgery 06/23/2016  . Strain of right pectoralis muscle 09/07/2016  . Thoracic aortic aneurysm ([place]) 06/08/2017  . Traumatic brain injury ([place])    1976  . Ulcer   . Varicose veins   . Varicose veins of lower extremities with ulcer ([place]) 11/16/2012      Past Surgical History:  Procedure Laterality Date  . BURR HOLE OF CRANIUM  04/10/75  . COLONOSCOPY    . COLONOSCOPY W/ POLYPECTOMY  2004, 2007, 02/03/11   multiple large hyperplastic polyps 04/07, adenomas and one serrated 2012  . ENDOVENOUS ABLATION SAPHENOUS VEIN W/ LASER Left 09-02-2010   EVLA left great and small saphenous vein by Curt Jews MD   . FOOT FUSION Right    [place]  . neck fusion    . POLYPECTOMY    . rt. knee replacement    . THORACIC AORTIC ENDOVASCULAR STENT GRAFT N/A 06/08/2017   Procedure: THORACIC AORTIC ENDOVASCULAR STENT GRAFT;  Surgeon:  Rosetta Posner, MD;  Location: [facility];  Service: Vascular;  Laterality: N/A;    in for   Chief Complaint  Patient presents with  . Foot Pain  . Shortness of Breath     HPI  Gibson Lad  is a 67 y.o. male, with past medical history significant for right foot hardware implantation and surgery by Dr. Clyde Canterbury at [facility], presenting with 1 week history of right foot swelling and pain.  Few weeks ago patient felt a crack and his right foot while near his car trying to change the tire.  Pain continued to increase.  MRI of the right foot was done and no signs of osteomyelitis were noted but his ESR and CRP are elevated.  Patient has a chronic ulcer also on the left foot that is being managed at [facility] as well awaiting skin transplant.  Patient had mild subjective fevers but none documented .    Review of Systems    In addition to the HPI above,  No chills, No Headache, No changes with Vision or hearing, No problems swallowing food or Liquids, No Chest pain, Cough or Shortness of Breath, No Abdominal  pain, No Nausea or Vommitting, Bowel movements are regular, No Blood in stool or Urine, No dysuria, No new skin rashes or bruises, No new weakness, tingling, numbness in any extremity, No recent weight gain or loss, No polyuria, polydypsia or polyphagia, No significant Mental Stressors.  A full 10 point Review of Systems was done, except as stated above, all other Review of Systems were negative.   Social History Social History   Tobacco Use  . Smoking status: Former Smoker    Types: Cigarettes    Last attempt to quit: 01/20/1981    Years since quitting: 37.2  . Smokeless tobacco: Never Used  Substance Use Topics  . Alcohol use: Yes    Comment: rare     Family History Family History  Problem Relation Age of Onset  . Rectal cancer Mother   . Colon cancer Mother        dx'd late 29's - died 2017/05/02  . Bone cancer Sister   . Lung cancer Sister   . Colon polyps Neg Hx      Prior to Admission medications   Medication Sig Start Date End Date Taking? Authorizing Provider  amLODipine (NORVASC) 5 MG tablet Take 5 mg by mouth daily. Reported on 01/13/2016   Yes [provider]  amphetamine-dextroamphetamine (ADDERALL) 30 MG tablet Take 30 mg by mouth 2 (two) times daily. 04/21/17  Yes [provider]  buPROPion (WELLBUTRIN SR) 200 MG 12 hr tablet Take 200 mg by mouth 2 (two) times daily. 04/20/17  Yes [provider]  lovastatin (MEVACOR) 20 MG tablet Take 40 mg by mouth daily.    Yes [provider]  traMADol (ULTRAM) 50 MG tablet Take 50 mg by mouth 3 (three) times daily as needed for moderate pain.   Yes [provider]    Allergies  Allergen Reactions  . Terazosin Other (See Comments)    dizziness    Physical Exam  Vitals  Blood pressure (!) 156/68, pulse 93, temperature 98.2 F (36.8 C), temperature source Oral, resp. rate 20, weight 117 kg (258 lb), SpO2 98 %.   1. General well-developed, well-nourished gentleman in no  acute distress  2. Normal affect and insight, Not Suicidal or Homicidal, Awake Alert, Oriented X 3.  3. No F.N deficits, grossly, patient moving all  extremities.  4. Ears and Eyes appear Normal, Conjunctivae clear, PERRLA. Moist Oral Mucosa.  5. Supple Neck, No JVD, No cervical lymphadenopathy appriciated, No Carotid Bruits.  6. Symmetrical Chest wall movement, Good air movement bilaterally, CTAB.  7. RRR, No Gallops, Rubs or Murmurs, No Parasternal Heave.  8. Positive Bowel Sounds, Abdomen Soft, Non tender, No organomegaly appriciated,No rebound -guarding or rigidity.  9.  No Cyanosis, Normal Skin Turgor, right foot swollen but no redness noted scars of fourth surgery well-healed.  Left foot ulcer covered with dressing with no signs of infection  10. Good muscle tone,  .    Data Review  CBC Recent Labs  Lab 03/29/18 1310 03/29/18 1425  WBC 13.9*  --   HGB 9.1* 9.2*  HCT 29.6* 27.0*  PLT 339  --   MCV 98.3  --   MCH 30.2  --   MCHC 30.7  --   RDW 13.8  --   LYMPHSABS 1.4  --   MONOABS 2.0*  --   EOSABS 0.4  --   BASOSABS 0.1  --    ------------------------------------------------------------------------------------------------------------------  Chemistries  Recent Labs  Lab 03/29/18 1130 03/29/18 1425  NA 140 138  K 3.8 3.7  CL 109 104  CO2 20*  --   GLUCOSE 142* 104*  BUN 20 23  CREATININE 1.51* 1.50*  CALCIUM 8.6*  --   AST 18  --   ALT 19  --   ALKPHOS 185*  --   BILITOT 1.1  --    ------------------------------------------------------------------------------------------------------------------ estimated creatinine clearance is 68.7 mL/min (A) (by C-G formula based on SCr of 1.5 mg/dL (H)). ------------------------------------------------------------------------------------------------------------------ No results for input(s): TSH, T4TOTAL, T3FREE, THYROIDAB in the last 72 hours.  Invalid input(s): FREET3   Coagulation profile No results for  input(s): INR, PROTIME in the last 168 hours. ------------------------------------------------------------------------------------------------------------------- No results for input(s): DDIMER in the last 72 hours. -------------------------------------------------------------------------------------------------------------------  Cardiac Enzymes No results for input(s): CKMB, TROPONINI, MYOGLOBIN in the last 168 hours.  Invalid input(s): CK ------------------------------------------------------------------------------------------------------------------ Invalid input(s): POCBNP   ---------------------------------------------------------------------------------------------------------------  Urinalysis    Component Value Date/Time   COLORURINE YELLOW 06/03/2017 No Name 06/03/2017 1045   LABSPEC 1.024 06/03/2017 1045   PHURINE 5.0 06/03/2017 1045   GLUCOSEU NEGATIVE 06/03/2017 1045   HGBUR NEGATIVE 06/03/2017 [place] 06/03/2017 [place] 06/03/2017 1045   PROTEINUR NEGATIVE 06/03/2017 1045   NITRITE NEGATIVE 06/03/2017 [place] 06/03/2017 1045    ----------------------------------------------------------------------------------------------------------------   Imaging results:   Dg Chest 2 View  Result Date: 03/29/2018 CLINICAL DATA:  Shortness of breath and cough. EXAM: CHEST - 2 VIEW COMPARISON:  Chest x-ray dated June 08, 2017. FINDINGS: The heart size and mediastinal contours are within normal limits. Prior thoracic stent graft placement. Normal pulmonary vascularity. No focal consolidation, pleural effusion, or pneumothorax. No acute osseous abnormality. Old rib fractures. IMPRESSION: No active cardiopulmonary disease. Electronically Signed   By: Titus Dubin M.D.   On: 03/29/2018 11:54   Ct Angio Chest Pe W/cm &/or Wo Cm  Result Date: 03/29/2018 CLINICAL DATA:  Right foot pain/swelling,  shortness of breath, wheezing, recent rotator cuff surgery EXAM: CT ANGIOGRAPHY CHEST WITH CONTRAST TECHNIQUE: Multidetector CT imaging of the chest was performed using the standard protocol during bolus administration of intravenous contrast. Multiplanar CT image reconstructions and MIPs were obtained to evaluate the vascular anatomy. CONTRAST:  129m ISOVUE-370 IOPAMIDOL (ISOVUE-370) INJECTION 76% COMPARISON:  CTA chest dated 07/13/2018 FINDINGS:  Cardiovascular: Satisfactory opacification of the bilateral pulmonary arteries to the lobar level. Evaluation of the bilateral lower lobe pulmonary arteries is mildly constrained by respiratory motion. No evidence of pulmonary embolism. Status post thoracic aortic aneurysm stent graft placement from the level of the left subclavian artery to the mid/lower descending thoracic aorta. Excluded eccentric saccular aneurysm along the right anterolateral aspect proximal descending thoracic aorta (series 5/image 37). No evidence of dissection. The heart is normal in size.  No pericardial effusion. Mild coronary atherosclerosis of the LAD. Mediastinum/Nodes: No suspicious mediastinal lymphadenopathy. Visualized thyroid is unremarkable. Lungs/Pleura: Evaluation of the lung parenchyma is constrained by respiratory motion. Within that constraint, there are no suspicious pulmonary nodules. Mild patchy ground-glass opacity in the right lung (for example, series 7/image 19), favoring atelectasis/mosaic attenuation relating to poor inspiration and air trapping. Mild dependent atelectasis in the right lower lobe. No focal consolidation. No pleural effusion or pneumothorax. Upper Abdomen: Visualized upper abdomen is grossly unremarkable. Musculoskeletal: Visualized osseous structures are within normal limits. Review of the MIP images confirms the above findings. IMPRESSION: No evidence of pulmonary embolism. Thoracic aortic stent with excluded saccular aneurysm along the proximal  descending thoracic aorta. No evidence of dissection. Aortic aneurysm NOS (ICD10-I71.9). Electronically Signed   By: Julian Hy M.D.   On: 03/29/2018 16:09   [name]  Result Date: 03/20/2018  [facility] [address], [place], [place] [address] [phone] NEUROIMAGING REPORT STUDY DATE: 03/19/2018 PATIENT NAME: TIGE MEAS DOB: July 05, 1951 MRN: [id number] EXAM: MRI Brain without contrast ORDERING CLINICIAN: Kathrynn Ducking, MD CLINICAL HISTORY: 67 year old man with gait disturbance COMPARISON FILMS: None TECHNIQUE: MRI of the brain without contrast was obtained utilizing 5 mm axial slices with T1, T2, T2 flair, SWI and diffusion weighted views.  T1 sagittal and T2 coronal views were obtained. CONTRAST: none IMAGING SITE: [facility], [place], [place] FINDINGS: On sagittal images, the spinal cord is imaged caudally to C2-C3 and is normal in caliber.   The contents of the posterior fossa are of normal size and position.   The pituitary gland and optic chiasm appear normal.    The third and lateral ventricles are enlarged, in proportion to the extent of moderate generalized cortical atrophy.  Mild corpus callosum atrophy is also noted.  There are no abnormal extra-axial collections of fluid.  The cerebellum and brainstem appears normal.   The deep gray matter appears normal.  There is some scattered lacunar infarctions in the frontal corona radiata and centrum semiovale and some scattered T2/FLAIR hyperintense foci consistent with chronic microvascular ischemic change..  None of these appear to be acute.  One focus on the left shows hemosiderin deposition.  Diffusion weighted images are normal.    The orbits appear normal.   The VIIth/VIIIth nerve complex appears normal.  The mastoid air cells appear normal.  The paranasal sinuses appear normal.  Flow voids are identified within the major intracerebral arteries.     This is an abnormal MRI of the  brain without contrast showing the following: 1.    Moderate generalized cortical atrophy and corpus callosal atrophy. 2.    Multiple chronic lacunar infarctions in the periventricular and deep white matter, predominantly in the frontal lobes.   One of these foci show some hemosiderin deposition consistent with a chronic microhemorrhage.  There are also chronic microvascular ischemic changes in the hemispheres. 3.    There are no acute findings. INTERPRETING PHYSICIAN: Richard A. Felecia Shelling, MD, PhD, Charlynn Grimes  Certified in  Neuroimaging by [employer]   Mr Foot Right W Wo Contrast  Result Date: 03/29/2018 CLINICAL DATA:  67 year old diabetic male with 3 day history of right foot pain, swelling and dyspnea. Patient reports a prior fusion of the right foot with chronic foot pain for the past 2 years. EXAM: MRI OF THE RIGHT FOREFOOT WITHOUT AND WITH CONTRAST TECHNIQUE: Multiplanar, multisequence MR imaging of the right foot was performed before and after the administration of intravenous contrast. CONTRAST:  20 cc MultiHance COMPARISON:  03/29/2018 radiographs of the foot. FINDINGS: Bones/Joint/Cartilage Metallic susceptibility artifacts are identified at site of midfoot arthrodesis medially limiting assessment for osteomyelitis or fracture in this location. This obscures the medial malleolus, talus and calcaneus. Additionally a it obscures the base of the first through third metatarsals and cuneiform. Visualized distal tibia and fibula: No acute marrow signal abnormality, fracture or joint dislocation. Visualized talus: Mild reactive edema adjacent to the subtalar joint. No fracture. No frank bone destruction. Tibiotalar joint: Small ankle joint effusion. Subtalar joint: The posterior facet appears congruent. The visualized middle and anterior facets are less well visualized due to metallic susceptibility artifacts. Calcaneus: Reactive marrow edema of the body and anterior or lateral calcaneus. No frank  bone destruction identified. Plantar calcaneal enthesopathy. Visualized cuboid: Reactive marrow edema without frank bone destruction. Small joint effusion across the calcaneocuboid articulation. Navicular: Degenerative subcortical cystic change. No acute appearing fracture. Adjacent heterotopic new bone formation versus ununited fracture fragments stigmata of Charcot arthropathy. Cuneiform: Obscured. First through fifth toes: Slight overlap of the great toe with second toe. Osteoarthritic joint space narrowing of the DIP and PIP joints, interphalangeal joint of the great toe and MTP. No joint effusion. No frank bone destruction. Ligaments Noncontributory Muscles and Tendons Largely intact tendons crossing the ankle joint. Normal morphology. No significant tenosynovitis. Soft tissues Diffuse soft tissue cellulitis of the ankle and foot. Suspected component of myositis along the plantar aspect of the midfoot with intramuscular edema noted. IMPRESSION: 1. Subcutaneous soft tissue swelling and edema compatible with cellulitis and/or stigmata of venous insufficiency. 2. Marked susceptibility artifacts from the patient's arthrodesis hardware limit assessment of the midfoot. There is probable reactive edema of the calcaneus secondary to postop change. No frank bone destruction nor findings strongly suspicious for osteomyelitis are identified. 3. Heterotopic ossifications over the dorsum of the midfoot may be postop or remote posttraumatic related or represent changes of a possible Charcot arthropathy. Electronically Signed   By: Ashley Royalty M.D.   On: 03/29/2018 22:49   Dg Foot Complete Right  Result Date: 03/29/2018 CLINICAL DATA:  Right foot pain and swelling for the past 2 weeks. No injury. EXAM: RIGHT FOOT COMPLETE - 3+ VIEW COMPARISON:  CT right foot dated November 30, 2017. FINDINGS: No acute fracture or dislocation. Old healed distal third metatarsal shaft fracture. Prior midfoot arthrodesis without solid osseous  fusion. The two most proximal screw screws remain fractured. Chronic fragmentation of the navicular bone is unchanged. Prominent heterotopic fragmentation versus nonunited fracture fragments dorsal to the talonavicular joint are unchanged. 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 Large plantar enthesophyte. Mild dorsal and medial soft tissue swelling. IMPRESSION: 1. Dorsal and medial soft tissue swelling. No acute osseous abnormality. 2. Prior midfoot arthrodesis without evidence of solid osseous fusion and unchanged fractures of the two most proximal screws. 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 Electronically Signed   By: Titus Dubin M.D.   On: 03/29/2018 13:21      Assessment &  Plan  A.  Right foot pain     MRI did not show any signs of infection    No redness or any signs of infection on the right foot noted    Is mild leukocytosis might be due to stress  Plan Discussed with Ms [facility] . I do not think that IV antibiotics will help this gentleman symptoms I feel that it is mechanical and has to be evaluated by the orthopedic surgeon at [facility] where he had the surgery Advised transfer     Code Status full  Disposition Plan: Transfer  Time spent in minutes : 31 minutes  Condition fair   '@SIGNATURE' @

## 2018-03-29 NOTE — ED Provider Notes (Signed)
Assumed care from Dr. Ellender [name] at shift change.  See prior notes for full H&P.  Briefly, 67 year old male here with concern of infected foot w/hardware in place.  Has had worsening erythema, swelling, new drainage along lateral ankle.  Labs with elevated CRP, ESR, leukocytosis.  Started on broad spectrum vanc/zosyn.  Plan:  Concern for osteomyelitis of foot.  Has hardware in the foot put in by orthopedist at [facility], [place].  Does not want to go to [facility], wants to remain here.  Getting MRI now, if infected hardware will need to go to [facility], if just soft tissue infection can stay here.  Results for orders placed or performed during the hospital encounter of 03/29/18  Comprehensive metabolic panel  Result Value Ref Range   Sodium 140 135 - 145 mmol/L   Potassium 3.8 3.5 - 5.1 mmol/L   Chloride 109 98 - 111 mmol/L   CO2 20 (L) 22 - 32 mmol/L   Glucose, Bld 142 (H) 70 - 99 mg/dL   BUN 20 8 - 23 mg/dL   Creatinine, Ser 1.51 (H) 0.61 - 1.24 mg/dL   Calcium 8.6 (L) 8.9 - 10.3 mg/dL   Total Protein 6.1 (L) 6.5 - 8.1 g/dL   Albumin 2.5 (L) 3.5 - 5.0 g/dL   AST 18 15 - 41 U/L   ALT 19 0 - 44 U/L   Alkaline Phosphatase 185 (H) 38 - 126 U/L   Total Bilirubin 1.1 0.3 - 1.2 mg/dL   GFR calc non Af Amer 47 (L) >60 mL/min   GFR calc Af Amer 54 (L) >60 mL/min   Anion gap 11 5 - 15  Sedimentation rate  Result Value Ref Range   Sed Rate 115 (H) 0 - 16 mm/hr  C-reactive protein  Result Value Ref Range   CRP 24.2 (H) <1.0 mg/dL  CBC with Differential/Platelet  Result Value Ref Range   WBC 13.9 (H) 4.0 - 10.5 K/uL   RBC 3.01 (L) 4.22 - 5.81 MIL/uL   Hemoglobin 9.1 (L) 13.0 - 17.0 g/dL   HCT 29.6 (L) 39.0 - 52.0 %   MCV 98.3 78.0 - 100.0 fL   MCH 30.2 26.0 - 34.0 pg   MCHC 30.7 30.0 - 36.0 g/dL   RDW 13.8 11.5 - 15.5 %   Platelets 339 150 - 400 K/uL   Neutrophils Relative % 70 %   Neutro Abs 9.8 (H) 1.7 - 7.7 K/uL   Lymphocytes Relative 10 %   Lymphs Abs 1.4 0.7 - 4.0 K/uL   Monocytes Relative  14 %   Monocytes Absolute 2.0 (H) 0.1 - 1.0 K/uL   Eosinophils Relative 3 %   Eosinophils Absolute 0.4 0.0 - 0.7 K/uL   Basophils Relative 1 %   Basophils Absolute 0.1 0.0 - 0.1 K/uL   Immature Granulocytes 2 %   Abs Immature Granulocytes 0.2 (H) 0.0 - 0.1 K/uL  I-Stat CG4 Lactic Acid, ED  Result Value Ref Range   Lactic Acid, Venous 1.48 0.5 - 1.9 mmol/L  I-stat troponin, ED  Result Value Ref Range   Troponin i, poc 0.01 0.00 - 0.08 ng/mL   Comment 3          I-Stat CG4 Lactic Acid, ED  Result Value Ref Range   Lactic Acid, Venous 0.77 0.5 - 1.9 mmol/L  I-Stat Chem 8, ED  Result Value Ref Range   Sodium 138 135 - 145 mmol/L   Potassium 3.7 3.5 - 5.1 mmol/L   Chloride 104 98 -  111 mmol/L   BUN 23 8 - 23 mg/dL   Creatinine, Ser 1.50 (H) 0.61 - 1.24 mg/dL   Glucose, Bld 104 (H) 70 - 99 mg/dL   Calcium, Ion 1.13 (L) 1.15 - 1.40 mmol/L   TCO2 24 22 - 32 mmol/L   Hemoglobin 9.2 (L) 13.0 - 17.0 g/dL   HCT 27.0 (L) 39.0 - 52.0 %   Dg Chest 2 View  Result Date: 03/29/2018 CLINICAL DATA:  Shortness of breath and cough. EXAM: CHEST - 2 VIEW COMPARISON:  Chest x-ray dated June 08, 2017. FINDINGS: The heart size and mediastinal contours are within normal limits. Prior thoracic stent graft placement. Normal pulmonary vascularity. No focal consolidation, pleural effusion, or pneumothorax. No acute osseous abnormality. Old rib fractures. IMPRESSION: No active cardiopulmonary disease. Electronically Signed   By: Trevor [name] M.D.   On: 03/29/2018 11:54   Ct Angio Chest Pe W/cm &/or Wo Cm  Result Date: 03/29/2018 CLINICAL DATA:  Right foot pain/swelling, shortness of breath, wheezing, recent rotator cuff surgery EXAM: CT ANGIOGRAPHY CHEST WITH CONTRAST TECHNIQUE: Multidetector CT imaging of the chest was performed using the standard protocol during bolus administration of intravenous contrast. Multiplanar CT image reconstructions and MIPs were obtained to evaluate the vascular anatomy.  CONTRAST:  157m ISOVUE-370 IOPAMIDOL (ISOVUE-370) INJECTION 76% COMPARISON:  CTA chest dated 07/13/2018 FINDINGS: Cardiovascular: Satisfactory opacification of the bilateral pulmonary arteries to the lobar level. Evaluation of the bilateral lower lobe pulmonary arteries is mildly constrained by respiratory motion. No evidence of pulmonary embolism. Status post thoracic aortic aneurysm stent graft placement from the level of the left subclavian artery to the mid/lower descending thoracic aorta. Excluded eccentric saccular aneurysm along the right anterolateral aspect proximal descending thoracic aorta (series 5/image 37). No evidence of dissection. The heart is normal in size.  No pericardial effusion. Mild coronary atherosclerosis of the LAD. Mediastinum/Nodes: No suspicious mediastinal lymphadenopathy. Visualized thyroid is unremarkable. Lungs/Pleura: Evaluation of the lung parenchyma is constrained by respiratory motion. Within that constraint, there are no suspicious pulmonary nodules. Mild patchy ground-glass opacity in the right lung (for example, series 7/image 19), favoring atelectasis/mosaic attenuation relating to poor inspiration and air trapping. Mild dependent atelectasis in the right lower lobe. No focal consolidation. No pleural effusion or pneumothorax. Upper Abdomen: Visualized upper abdomen is grossly unremarkable. Musculoskeletal: Visualized osseous structures are within normal limits. Review of the MIP images confirms the above findings. IMPRESSION: No evidence of pulmonary embolism. Thoracic aortic stent with excluded saccular aneurysm along the proximal descending thoracic aorta. No evidence of dissection. Aortic aneurysm NOS (ICD10-I71.9). Electronically Signed   By: SJulian HyM.D.   On: 03/29/2018 16:09   [name]  Result Date: 03/20/2018  [facility] [address] [place] [phone] REPORT STUDY DATE:  03/19/2018 PATIENT NAME: DBEAUMONT AUSTADDOB: [id number]: [id number]: MRI Brain without contrast ORDERING CLINICIAN: CKathrynn Ducking MD CLINICAL HISTORY: 67year old man with gait disturbance COMPARISON FILMS: None TECHNIQUE: MRI of the brain without contrast was obtained utilizing 5 mm axial slices with T1, T2, T2 flair, SWI and diffusion weighted views.  T1 sagittal and T2 coronal views were obtained. CONTRAST: none [place]AGING S[place] [facility] [place]: On sagittal images, the spinal cord is imaged caudally to C2-C3 and is normal in caliber.   The contents of the posterior fossa are of normal size and position.   The pituitary gland and optic chiasm appear normal.  The third and lateral ventricles are enlarged, in proportion to the extent of moderate generalized cortical atrophy.  Mild corpus callosum atrophy is also noted.  There are no abnormal extra-axial collections of fluid.  The cerebellum and brainstem appears normal.   The deep gray matter appears normal.  There is some scattered lacunar infarctions in the frontal corona radiata and centrum semiovale and some scattered T2/FLAIR hyperintense foci consistent with chronic microvascular ischemic change..  None of these appear to be acute.  One focus on the left shows hemosiderin deposition.  Diffusion weighted images are normal.    The orbits appear normal.   The VIIth/VIIIth nerve complex appears normal.  The mastoid air cells appear normal.  The paranasal sinuses appear normal.  Flow voids are identified within the major intracerebral arteries.     This is an abnormal MRI of the brain without contrast showing the following: 1.    Moderate generalized cortical atrophy and corpus callosal atrophy. 2.    Multiple chronic lacunar infarctions in the periventricular and deep white matter, predominantly in the frontal lobes.   One of these foci show some hemosiderin deposition consistent with a chronic microhemorrhage.   There are also chronic microvascular ischemic changes in the hemispheres. 3.    There are no acute findings. INTERPRETING PHYSICIAN: Richard A. Felecia Shelling, MD, PhD, FAAN Certified in  Neuroimaging by [employer]   Mr Foot Right W Wo Contrast  Result Date: 03/29/2018 CLINICAL DATA:  67 year old diabetic male with 3 day history of right foot pain, swelling and dyspnea. Patient reports a prior fusion of the right foot with chronic foot pain for the past 2 years. EXAM: MRI OF THE RIGHT FOREFOOT WITHOUT AND WITH CONTRAST TECHNIQUE: Multiplanar, multisequence MR imaging of the right foot was performed before and after the administration of intravenous contrast. CONTRAST:  20 cc MultiHance COMPARISON:  03/29/2018 radiographs of the foot. FINDINGS: Bones/Joint/Cartilage Metallic susceptibility artifacts are identified at site of midfoot arthrodesis medially limiting assessment for osteomyelitis or fracture in this location. This obscures the medial malleolus, talus and calcaneus. Additionally a it obscures the base of the first through third metatarsals and cuneiform. Visualized distal tibia and fibula: No acute marrow signal abnormality, fracture or joint dislocation. Visualized talus: Mild reactive edema adjacent to the subtalar joint. No fracture. No frank bone destruction. Tibiotalar joint: Small ankle joint effusion. Subtalar joint: The posterior facet appears congruent. The visualized middle and anterior facets are less well visualized due to metallic susceptibility artifacts. Calcaneus: Reactive marrow edema of the body and anterior or lateral calcaneus. No frank bone destruction identified. Plantar calcaneal enthesopathy. Visualized cuboid: Reactive marrow edema without frank bone destruction. Small joint effusion across the calcaneocuboid articulation. Navicular: Degenerative subcortical cystic change. No acute appearing fracture. Adjacent heterotopic new bone formation versus ununited fracture  fragments stigmata of Charcot arthropathy. Cuneiform: Obscured. First through fifth toes: Slight overlap of the great toe with second toe. Osteoarthritic joint space narrowing of the DIP and PIP joints, interphalangeal joint of the great toe and MTP. No joint effusion. No frank bone destruction. Ligaments Noncontributory Muscles and Tendons Largely intact tendons crossing the ankle joint. Normal morphology. No significant tenosynovitis. Soft tissues Diffuse soft tissue cellulitis of the ankle and foot. Suspected component of myositis along the plantar aspect of the midfoot with intramuscular edema noted. IMPRESSION: 1. Subcutaneous soft tissue swelling and edema compatible with cellulitis and/or stigmata of venous insufficiency. 2. Marked susceptibility artifacts from the patient'Trevor arthrodesis hardware limit assessment of the  midfoot. There is probable reactive edema of the calcaneus secondary to postop change. No frank bone destruction nor findings strongly suspicious for osteomyelitis are identified. 3. Heterotopic ossifications over the dorsum of the midfoot may be postop or remote posttraumatic related or represent changes of a possible Charcot arthropathy. Electronically Signed   By: Ashley Royalty M.D.   On: 03/29/2018 22:49   Dg Foot Complete Right  Result Date: 03/29/2018 CLINICAL DATA:  Right foot pain and swelling for the past 2 weeks. No injury. EXAM: RIGHT FOOT COMPLETE - 3+ VIEW COMPARISON:  CT right foot dated November 30, 2017. FINDINGS: No acute fracture or dislocation. Old healed distal third metatarsal shaft fracture. Prior midfoot arthrodesis without solid osseous fusion. The two most proximal screw screws remain fractured. Chronic fragmentation of the navicular bone is unchanged. Prominent heterotopic fragmentation versus nonunited fracture fragments dorsal to the talonavicular joint are unchanged. 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 Large plantar enthesophyte. Mild dorsal and medial soft  tissue swelling. IMPRESSION: 1. Dorsal and medial soft tissue swelling. No acute osseous abnormality. 2. Prior midfoot arthrodesis without evidence of solid osseous fusion and unchanged fractures of the two most proximal screws. 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 Electronically Signed   By: Trevor [name] M.D.   On: 03/29/2018 13:21   MRI without noted osteomyelitis or hardware compromise, appears to be cellulitis based on MRI although there is some artifact from his hardware.  Has already received IV abx.  Will admit for ongoing care.    Spoke with Dr. Laren Everts-- will evaluate in the ED and admit.  Spoke with Dr. Laren Everts-- has evaluated and recommends patient to be transferred to [facility] so she can be evaluated by his orthopedist.    Spoke with orthopedics at [facility] Dr. Prudy Feeler-- will not accept patient in transfer to his service.  He will see patient in consult if I can get him admitted ER to ER.  Spoke with ED attending-- Dr. Rayna Sexton, she will accept in transfer.  Will have imaging burned onto disc to transfer with patient.  [facility] without available truck at this time.  [facility] transport will come pick patient up.  EMTALA completed.  Patient updated, agreeable with care plan.   Trevor Pickett, PA-C 03/30/18 Trevor Coma, MD 03/30/18 1023

## 2018-03-29 NOTE — ED Notes (Signed)
Patient transported to MRI 

## 2018-03-29 NOTE — ED Provider Notes (Signed)
Patient seen and examined by myself.  Agree with note by Langston Masker.  Briefly, the patient is a 67 year old male here with significant swelling and pain of the right foot.  He is status post hardware implantation and surgery by Dr. Clyde Canterbury. ESR, CRP markedly elevated. Clinically, I'm concerned for osteo, infected hardware. Pt would prefer to remain at [facility]. Discussed management options with him. Given that he will need to be transferred to [facility] for management of infected hardware, will check MRI. If no infected hardware, can be admitted here. 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   Duffy Bruce, MD 03/29/18 2220

## 2018-03-29 NOTE — ED Notes (Signed)
Pt to vascular.

## 2018-03-29 NOTE — ED Provider Notes (Signed)
[facility] Provider Note   CSN: [id number] Arrival date & time: 03/29/18  1052     History   Chief Complaint Chief Complaint  Patient presents with  . Foot Pain  . Shortness of Breath    HPI Trevor [name] is a 67 y.o. male.  HPI   Patient is a 67 year old male with a history of CAD status post stent placement, essential hypertension, arthritis, pericarditis, descending thoracic aortic aneurysm (followed, last CT 2018), presenting for right foot pain, foot and ankle swelling, and shortness of breath.  Patient reports that his symptoms began suddenly, 3 days ago, and since then he has had difficulty placing weight on his right foot.  Patient reports that he has a prior fusion surgery in that foot, and has had pain in it over the past 2 years since the fusion, however it became significantly more swollen over the last 3 days.  Patient called his orthopedic office, who recommended come to the emergency department to rule out DVT given that he had shoulder arthroplasty in May 2019.  Patient denies trauma to the foot.  Patient does report some erythema to it and bruising above baseline.  Patient denies any fever or chills.  patient also notes that in the same interval, he developed shortness of breath.  Patient denies any chest pain.  Patient reports unclear if it is worse exertional, due to not exerting himself significantly over the last 2 to 3 days.  No history of COPD.  No bronchodilators at home.  Patient does report he is being treated at the wound clinic for chronic venous stasis ulcer of the left lower extremity pain  Past Medical History:  Diagnosis Date  . Acute pain of left shoulder 04/04/2017  . ADD (attention deficit disorder)   . Allergy   . Arthritis    right foot  . Arthritis of midfoot 04/29/2016   Overview:  Added automatically from request for surgery [id number]  . Blood transfusion without reported diagnosis   . Chest wall pain  09/29/2017  . Chronic pain of right ankle 04/08/2016  . Cough due to ACE inhibitor 03/18/2017  . Depression   . Descending thoracic aortic aneurysm ([place]) 04/04/2017   Overview:  4.5 cm on CT scan of April 04, 2017.  . Diverticulosis of colon 11/25/2015  . Dyspnea on exertion 04/04/2017  . Essential hypertension 11/25/2015  . Executive function deficit 12/31/2015  . Family history of colon cancer in mother    dx'd age late 69's  . Foot pain, right 04/08/2016  . H/O blood clots   . Health maintenance examination 09/29/2017  . History of colonic polyps 02/04/2011   3 10 mm hyperplastic polyps removed from right and left colon 2004 4 polyps, one 10 mm others diminutive and hyperplastic 2007 Followed closer than routine risk due to multiple large hyperplastic polyps 5 adenomas with one of them serrated  05/29/2015 11 polyps max 10 mm adenomas, ssa's, hyperplastic - 09/21/2017 3 diminutive polyps removed adenomas - recall 2021 (2 yrs)     . Hypercholesterolemia 11/25/2015  . Hyperlipidemia   . Hypertension   . Left foot pain 01/27/2016  . Nocturia more than twice per night 12/31/2015  . Pain in joints 12/31/2015  . Pectoralis major tendinitis, right 03/18/2017  . Pericarditis   . Pneumonia   . Primary osteoarthritis of right foot 11/14/2015  . Shortness of breath 09/29/2017  . Status post right foot surgery 06/23/2016  . Strain of right  pectoralis muscle 09/07/2016  . Thoracic aortic aneurysm ([place]) 06/08/2017  . Traumatic brain injury ([place])    1976  . Ulcer   . Varicose veins   . Varicose veins of lower extremities with ulcer ([place]) 11/16/2012    Patient Active Problem List   Diagnosis Date Noted  . Chest wall pain 09/29/2017  . Shortness of breath 09/29/2017  . Health maintenance examination 09/29/2017  . Thoracic aortic aneurysm ([place]) 06/08/2017  . Acute pain of left shoulder 04/04/2017  . Descending thoracic aortic aneurysm ([place]) 04/04/2017  . Dyspnea on exertion 04/04/2017  . Cough due to ACE  inhibitor 03/18/2017  . Pectoralis major tendinitis, right 03/18/2017  . Strain of right pectoralis muscle 09/07/2016  . Status post right foot surgery 06/23/2016  . Arthritis of midfoot 04/29/2016  . Chronic pain of right ankle 04/08/2016  . Foot pain, right 04/08/2016  . Left foot pain 01/27/2016  . Executive function deficit 12/31/2015  . Nocturia more than twice per night 12/31/2015  . Pain in joints 12/31/2015  . ADD (attention deficit disorder) 11/25/2015  . Depression 11/25/2015  . Diverticulosis of colon 11/25/2015  . Essential hypertension 11/25/2015  . Hypercholesterolemia 11/25/2015  . Primary osteoarthritis of right foot 11/14/2015  . Family history of colon cancer in elderly mother 05/29/2015  . Varicose veins of lower extremities with ulcer ([place]) 11/16/2012  . History of colonic polyps 02/04/2011    Past Surgical History:  Procedure Laterality Date  . BURR HOLE OF CRANIUM  04/10/75  . COLONOSCOPY    . COLONOSCOPY W/ POLYPECTOMY  2004, 2007, 02/03/11   multiple large hyperplastic polyps 04/07, adenomas and one serrated 2012  . ENDOVENOUS ABLATION SAPHENOUS VEIN W/ LASER Left 09-02-2010   EVLA left great and small saphenous vein by Curt Jews MD   . FOOT FUSION Right    [facility]  . neck fusion    . POLYPECTOMY    . rt. knee replacement    . THORACIC AORTIC ENDOVASCULAR STENT GRAFT N/A 06/08/2017   Procedure: THORACIC AORTIC ENDOVASCULAR STENT GRAFT;  Surgeon: Rosetta Posner, MD;  Location: [facility] OR;  Service: Vascular;  Laterality: N/A;        Home Medications    Prior to Admission medications   Medication Sig Start Date End Date Taking? Authorizing Provider  amLODipine (NORVASC) 5 MG tablet Take 5 mg by mouth daily. Reported on 01/13/2016    [provider]  amphetamine-dextroamphetamine (ADDERALL) 30 MG tablet Take 30 mg by mouth 2 (two) times daily. 04/21/17   [provider]  buPROPion (WELLBUTRIN SR) 200 MG 12 hr tablet Take 200 mg  by mouth 2 (two) times daily. 04/20/17   [provider]  losartan-hydrochlorothiazide (HYZAAR) 100-12.5 MG tablet Take 1 tablet by mouth daily. 03/18/17   [provider]  lovastatin (MEVACOR) 20 MG tablet Take 40 mg by mouth daily.     [provider]  meloxicam (MOBIC) 15 MG tablet Take 7.5-15 mg by mouth daily as needed for pain.     [provider]  traMADol (ULTRAM) 50 MG tablet Take one tablet three times a day as needed for pain. 09/29/17   [provider]    Family History Family History  Problem Relation Age of Onset  . Rectal cancer Mother   . Colon cancer Mother        dx'd late 19's - died 08-May-2017  . Bone cancer Sister   . Lung cancer Sister   . Colon  polyps Neg Hx     Social History Social History   Tobacco Use  . Smoking status: Former Smoker    Types: Cigarettes    Last attempt to quit: 01/20/1981    Years since quitting: 37.2  . Smokeless tobacco: Never Used  Substance Use Topics  . Alcohol use: Yes    Comment: rare  . Drug use: No     Allergies   Terazosin   Review of Systems Review of Systems  Constitutional: Negative for chills and fever.  HENT: Negative for congestion and rhinorrhea.   Eyes: Negative for visual disturbance.  Respiratory: Positive for shortness of breath. Negative for cough and chest tightness.   Cardiovascular: Positive for leg swelling. Negative for chest pain.  Gastrointestinal: Negative for abdominal pain, nausea and vomiting.  Genitourinary: Negative for dysuria and flank pain.  Musculoskeletal: Positive for arthralgias, joint swelling and myalgias. Negative for back pain.  Skin: Positive for color change.  Neurological: Negative for dizziness, syncope and light-headedness.     Physical Exam Updated Vital Signs BP (!) 166/73   Pulse 88   Temp 98.2 F (36.8 C) (Oral)   Resp (!) 23   SpO2 95%   Physical Exam  Constitutional: He appears well-developed and well-nourished. No  distress.  HENT:  Head: Normocephalic and atraumatic.  Mouth/Throat: Oropharynx is clear and moist.  Eyes: Pupils are equal, round, and reactive to light. Conjunctivae and EOM are normal.  Neck: Normal range of motion. Neck supple.  Cardiovascular: Normal rate, regular rhythm, S1 normal and S2 normal.  No murmur heard. Right lower extremity DP and PT pulse are 2+, confirmed by Doppler.  Pulmonary/Chest: Effort normal. He has wheezes in the right upper field. He has no rales.  Abdominal: Soft. He exhibits no distension. There is no tenderness. There is no guarding.  Musculoskeletal: He exhibits no edema or deformity.  Right lower extremity is diffusely edematous, there is erythema overlying the surgical scar of the dorsum of the foot.  Tenderness is greatest over the lateral malleolus, but no focal tenderness overlying the surgical site or the cuneiform fusion. Patient has calf tenderness up to the popliteal region.  Stasis ulcers of the LLE well healing. New pressure ulcer of 5th metatarsal.  Lymphadenopathy:    He has no cervical adenopathy.  Neurological: He is alert.  Cranial nerves grossly intact. Patient moves extremities symmetrically and with good coordination.  Skin: Skin is warm and dry. No rash noted. No erythema.  Psychiatric: He has a normal mood and affect. His behavior is normal. Judgment and thought content normal.  Nursing note and vitals reviewed.        ED Treatments / Results  Labs (all labs ordered are listed, but only abnormal results are displayed) Labs Reviewed  COMPREHENSIVE METABOLIC PANEL - Abnormal; Notable for the following components:      Result Value   CO2 20 (*)    Glucose, Bld 142 (*)    Creatinine, Ser 1.51 (*)    Calcium 8.6 (*)    Total Protein 6.1 (*)    Albumin 2.5 (*)    Alkaline Phosphatase 185 (*)    GFR calc non Af Amer 47 (*)    GFR calc Af Amer 54 (*)    All other components within normal limits  C-REACTIVE PROTEIN - Abnormal;  Notable for the following components:   CRP 24.2 (*)    All other components within normal limits  CBC WITH DIFFERENTIAL/PLATELET - Abnormal; Notable for the following  components:   WBC 13.9 (*)    RBC 3.01 (*)    Hemoglobin 9.1 (*)    HCT 29.6 (*)    Neutro Abs 9.8 (*)    Monocytes Absolute 2.0 (*)    Abs Immature Granulocytes 0.2 (*)    All other components within normal limits  I-STAT CHEM 8, ED - Abnormal; Notable for the following components:   Creatinine, Ser 1.50 (*)    Glucose, Bld 104 (*)    Calcium, Ion 1.13 (*)    Hemoglobin 9.2 (*)    HCT 27.0 (*)    All other components within normal limits  CBC WITH DIFFERENTIAL/PLATELET  SEDIMENTATION RATE  I-STAT CG4 LACTIC ACID, ED  I-STAT TROPONIN, ED  I-STAT CG4 LACTIC ACID, ED    EKG EKG Interpretation  Date/Time:  Wednesday March 29 2018 11:13:07 EDT Ventricular Rate:  89 PR Interval:  160 QRS Duration: 92 QT Interval:  366 QTC Calculation: 445 R Axis:   56 Text Interpretation:  Normal sinus rhythm Normal ECG Significant baseline artifact Otherwise no significant change Confirmed by Duffy Bruce [id number]) on 03/29/2018 2:46:18 PM   Radiology Dg Chest 2 View  Result Date: 03/29/2018 CLINICAL DATA:  Shortness of breath and cough. EXAM: CHEST - 2 VIEW COMPARISON:  Chest x-ray dated June 08, 2017. FINDINGS: The heart size and mediastinal contours are within normal limits. Prior thoracic stent graft placement. Normal pulmonary vascularity. No focal consolidation, pleural effusion, or pneumothorax. No acute osseous abnormality. Old rib fractures. IMPRESSION: No active cardiopulmonary disease. Electronically Signed   By: Titus Dubin M.D.   On: 03/29/2018 11:54   Dg Foot Complete Right  Result Date: 03/29/2018 CLINICAL DATA:  Right foot pain and swelling for the past 2 weeks. No injury. EXAM: RIGHT FOOT COMPLETE - 3+ VIEW COMPARISON:  CT right foot dated November 30, 2017. FINDINGS: No acute fracture or dislocation. Old  healed distal third metatarsal shaft fracture. Prior midfoot arthrodesis without solid osseous fusion. The two most proximal screw screws remain fractured. Chronic fragmentation of the navicular bone is unchanged. Prominent heterotopic fragmentation versus nonunited fracture fragments dorsal to the talonavicular joint are unchanged. 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 Large plantar enthesophyte. Mild dorsal and medial soft tissue swelling. IMPRESSION: 1. Dorsal and medial soft tissue swelling. No acute osseous abnormality. 2. Prior midfoot arthrodesis without evidence of solid osseous fusion and unchanged fractures of the two most proximal screws. 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 Electronically Signed   By: Titus Dubin M.D.   On: 03/29/2018 13:21    Procedures Procedures (including critical care time)  Medications Ordered in ED Medications  iopamidol (ISOVUE-370) 76 % injection (has no administration in time range)  albuterol (PROVENTIL) (2.5 MG/3ML) 0.083% nebulizer solution 5 mg (5 mg Nebulization Given 03/29/18 1314)  ipratropium (ATROVENT) nebulizer solution 0.5 mg (0.5 mg Nebulization Given 03/29/18 1314)  iopamidol (ISOVUE-370) 76 % injection 100 mL (100 mLs Intravenous Contrast Given 03/29/18 1535)     Initial Impression / Assessment and Plan / ED Course  I have reviewed the triage vital signs and the nursing notes.  Pertinent labs & imaging results that were available during my care of the patient were reviewed by me and considered in my medical decision making (see chart for details).     Patient nontoxic-appearing, non-tachycardic and hemodynamically stable.  Patient slightly tachypneic on presentation with respirations of 23.  Given the wheezes in the right upper lung field, new onset shortness of breath, will  give single nebulizer treatment, will obtain CTPA.  for DVT study, however also have suspicion for infection, osteomyelitis versus  cellulitis of the right lower extremity versus left lower extremity where he has good venous stasis ulcer.  At this time, CTPA is pending.  The venous study is negative for DVT.  Patient has multiple nonthrombosed varicosities in the lower leg, consistent with prior.  X-ray of the right lower extremity demonstrates no obvious lesions concerning for osteomyelitis.  Lab work significant for leukocytosis of 13.9.  Hemoglobin lower from 1 year ago see below for prior.  Given elevated creatinine today compared to 1 year ago, will provide fluid repletion for AKI.  Patient not isolated elevated alk phos value, could be due to bone turnover.  Hemoglobin  Date Value Ref Range Status  03/29/2018 9.2 (L) 13.0 - 17.0 g/dL Final  03/29/2018 9.1 (L) 13.0 - 17.0 g/dL Final  06/09/2017 10.8 (L) 13.0 - 17.0 g/dL Final  06/08/2017 11.2 (L) 13.0 - 17.0 g/dL Final   Lab Results  Component Value Date   CREATININE 1.50 (H) 03/29/2018   CREATININE 1.51 (H) 03/29/2018   CREATININE 1.10 06/09/2017   Suspect osteomyelitis of the right foot until proven otherwise.  Does not look overtly cellulitic.  Vancomycin and Zosyn initiated.  Care signed out to Dr. Duffy Bruce, MD at 4:20 PM for remainder of testing and admission.   Final Clinical Impressions(s) / ED Diagnoses   Final diagnoses:  Right foot pain  Shortness of breath  Elevated blood pressure reading with diagnosis of hypertension    ED Discharge Orders    None       Tamala Julian 03/29/18 1621    Duffy Bruce, MD 03/30/18 1024

## 2018-03-29 NOTE — Progress Notes (Signed)
Pharmacy Antibiotic Note  Trevor [name] is a 67 y.o. male admitted on 03/29/2018 with foot pain/swelling and SOB. Planning to start antibiotics for cellulitis vs osteomyelitis on R foot. SCr 1.5, eCrCl ~ 60 ml/min.   Plan: -Vancomycin 2500 mg IV x1 then 750 mg IV q12h -Zosyn 3.375 g IV q8h -Monitor renal fx, cultures, VT as needed  Weight: 258 lb (117 kg)  Temp (24hrs), Avg:98.2 F (36.8 C), Min:98.2 F (36.8 C), Max:98.2 F (36.8 C)  Recent Labs  Lab 03/29/18 1130 03/29/18 1149 03/29/18 1245 03/29/18 1310 03/29/18 1425  WBC  --   --   --  13.9*  --   CREATININE 1.51*  --   --   --  1.50*  LATICACIDVEN  --  1.48 0.77  --   --     Estimated Creatinine Clearance: 68.7 mL/min (A) (by C-G formula based on SCr of 1.5 mg/dL (H)).    Allergies  Allergen Reactions  . Terazosin Other (See Comments)    dizziness    Antimicrobials this admission: 7/17 vancomycin > 7/17 zosyn >  Dose adjustments this admission: N/A  Microbiology results: N/A   Trevor [name] 03/29/2018 4:24 PM

## 2018-03-29 NOTE — ED Triage Notes (Signed)
Pt c/o pain and swelling to right foot since Sunday night. Pt had rotator cuff surgery may 15th and orthopedic dr was concerned for possible blood clot in patients foot. Denies any known injury or recent travel anywhere. Also endorses some sob and wheezing, denies cough.

## 2018-03-30 DIAGNOSIS — L03115 Cellulitis of right lower limb: Secondary | ICD-10-CM | POA: Diagnosis not present

## 2018-03-30 DIAGNOSIS — Z79899 Other long term (current) drug therapy: Secondary | ICD-10-CM | POA: Diagnosis not present

## 2018-03-30 DIAGNOSIS — R0602 Shortness of breath: Secondary | ICD-10-CM | POA: Diagnosis not present

## 2018-03-30 DIAGNOSIS — I10 Essential (primary) hypertension: Secondary | ICD-10-CM | POA: Diagnosis not present

## 2018-03-30 DIAGNOSIS — M14671 Charcot's joint, right ankle and foot: Secondary | ICD-10-CM | POA: Diagnosis not present

## 2018-03-30 DIAGNOSIS — Z87891 Personal history of nicotine dependence: Secondary | ICD-10-CM | POA: Diagnosis not present

## 2018-03-30 DIAGNOSIS — M79671 Pain in right foot: Secondary | ICD-10-CM | POA: Diagnosis not present

## 2018-03-30 DIAGNOSIS — L539 Erythematous condition, unspecified: Secondary | ICD-10-CM | POA: Diagnosis not present

## 2018-03-30 MED ORDER — HYDROMORPHONE HCL 1 MG/ML IJ SOLN
1.0000 mg | Freq: Once | INTRAMUSCULAR | Status: AC
  Administered 2018-03-30: 1 mg via INTRAVENOUS
  Filled 2018-03-30: qty 1

## 2018-04-03 DIAGNOSIS — I87332 Chronic venous hypertension (idiopathic) with ulcer and inflammation of left lower extremity: Secondary | ICD-10-CM | POA: Diagnosis not present

## 2018-04-03 DIAGNOSIS — L97322 Non-pressure chronic ulcer of left ankle with fat layer exposed: Secondary | ICD-10-CM | POA: Diagnosis not present

## 2018-04-06 DIAGNOSIS — S92341D Displaced fracture of fourth metatarsal bone, right foot, subsequent encounter for fracture with routine healing: Secondary | ICD-10-CM | POA: Diagnosis not present

## 2018-04-06 DIAGNOSIS — M2011 Hallux valgus (acquired), right foot: Secondary | ICD-10-CM | POA: Diagnosis not present

## 2018-04-06 DIAGNOSIS — M85871 Other specified disorders of bone density and structure, right ankle and foot: Secondary | ICD-10-CM | POA: Diagnosis not present

## 2018-04-06 DIAGNOSIS — M79671 Pain in right foot: Secondary | ICD-10-CM | POA: Diagnosis not present

## 2018-04-06 DIAGNOSIS — S92331D Displaced fracture of third metatarsal bone, right foot, subsequent encounter for fracture with routine healing: Secondary | ICD-10-CM | POA: Diagnosis not present

## 2018-04-06 DIAGNOSIS — M19071 Primary osteoarthritis, right ankle and foot: Secondary | ICD-10-CM | POA: Diagnosis not present

## 2018-04-06 DIAGNOSIS — Z981 Arthrodesis status: Secondary | ICD-10-CM | POA: Diagnosis not present

## 2018-04-06 DIAGNOSIS — S92511D Displaced fracture of proximal phalanx of right lesser toe(s), subsequent encounter for fracture with routine healing: Secondary | ICD-10-CM | POA: Diagnosis not present

## 2018-04-06 DIAGNOSIS — M7731 Calcaneal spur, right foot: Secondary | ICD-10-CM | POA: Diagnosis not present

## 2018-04-11 DIAGNOSIS — I87332 Chronic venous hypertension (idiopathic) with ulcer and inflammation of left lower extremity: Secondary | ICD-10-CM | POA: Diagnosis not present

## 2018-04-18 ENCOUNTER — Encounter (HOSPITAL_BASED_OUTPATIENT_CLINIC_OR_DEPARTMENT_OTHER): Payer: PPO | Attending: Internal Medicine

## 2018-04-18 DIAGNOSIS — L03115 Cellulitis of right lower limb: Secondary | ICD-10-CM | POA: Insufficient documentation

## 2018-04-18 DIAGNOSIS — Z87891 Personal history of nicotine dependence: Secondary | ICD-10-CM | POA: Insufficient documentation

## 2018-04-18 DIAGNOSIS — L97322 Non-pressure chronic ulcer of left ankle with fat layer exposed: Secondary | ICD-10-CM | POA: Insufficient documentation

## 2018-04-18 DIAGNOSIS — I10 Essential (primary) hypertension: Secondary | ICD-10-CM | POA: Diagnosis not present

## 2018-04-18 DIAGNOSIS — I87312 Chronic venous hypertension (idiopathic) with ulcer of left lower extremity: Secondary | ICD-10-CM | POA: Diagnosis not present

## 2018-04-18 DIAGNOSIS — I87332 Chronic venous hypertension (idiopathic) with ulcer and inflammation of left lower extremity: Secondary | ICD-10-CM | POA: Diagnosis not present

## 2018-04-24 ENCOUNTER — Ambulatory Visit: Payer: PPO | Admitting: Neurology

## 2018-04-24 ENCOUNTER — Encounter: Payer: Self-pay | Admitting: Neurology

## 2018-04-24 DIAGNOSIS — G603 Idiopathic progressive neuropathy: Secondary | ICD-10-CM

## 2018-04-24 DIAGNOSIS — R269 Unspecified abnormalities of gait and mobility: Secondary | ICD-10-CM

## 2018-04-24 DIAGNOSIS — R3915 Urgency of urination: Secondary | ICD-10-CM

## 2018-04-24 DIAGNOSIS — G629 Polyneuropathy, unspecified: Secondary | ICD-10-CM | POA: Insufficient documentation

## 2018-04-24 DIAGNOSIS — R29818 Other symptoms and signs involving the nervous system: Secondary | ICD-10-CM

## 2018-04-24 HISTORY — DX: Polyneuropathy, unspecified: G62.9

## 2018-04-24 NOTE — Progress Notes (Addendum)
EMG and nerve conduction study done today shows evidence of a significant I merrily axonal peripheral neuropathy that likely is severe enough to result in some balance issues.  This could potentially be a source of his changing gait problems.  The patient reports a lot of urinary urgency and frequency, I will make a referral to a urology physician.  The patient may discuss the MRI cervical spine findings with Dr. Sherwood Gambler from neurosurgery.    [place]    Nerve / Sites Muscle Latency Ref. Amplitude Ref. Rel Amp Segments Distance Velocity Ref. Area    ms ms mV mV %  cm m/s m/s mVms  R Median - APB     Wrist APB 6.4 ?4.4 3.3 ?4.0 100 Wrist - APB 7   14.5     Upper arm APB 12.2  3.0  89.9 Upper arm - Wrist 25 43 ?49 13.7  R Ulnar - ADM     Wrist ADM 3.3 ?3.3 8.4 ?6.0 100 Wrist - ADM 7   23.5     B.Elbow ADM 8.2  7.4  88.3 B.Elbow - Wrist 24 49 ?49 23.1     A.Elbow ADM 10.4  7.0  94.3 A.Elbow - B.Elbow 10 45 ?49 22.2         A.Elbow - Wrist      R Peroneal - EDB     Ankle EDB NR ?6.5 NR ?2.0 NR Ankle - EDB 9   NR     Fib head EDB NR  NR  NR Fib head - Ankle 35 NR ?44 NR  R Tibial - AH     Ankle AH NR ?5.8 NR ?4.0 NR Ankle - AH 9   NR     Pop fossa AH NR  NR  NR Pop fossa - Ankle 41 NR ?41 NR             SNC    Nerve / Sites Rec. Site Peak Lat Ref.  Amp Ref. Segments Distance    ms ms V V  cm  R Radial - Anatomical snuff box (Forearm)     Forearm Wrist 3.1 ?2.9 10 ?15 Forearm - Wrist 10  R Sural - Ankle (Calf)     Calf Ankle NR ?4.4 NR ?6 Calf - Ankle 14  R Superficial peroneal - Ankle     Lat leg Ankle NR ?4.4 NR ?6 Lat leg - Ankle 14  R Median - Orthodromic (Dig II, Mid palm)     Dig II Wrist NR ?3.4 NR ?10 Dig II - Wrist 13  R Ulnar - Orthodromic, (Dig V, Mid palm)     Dig V Wrist 3.3 ?3.1 2 ?5 Dig V - Wrist 4               F  Wave    Nerve F Lat Ref.   ms ms  R Ulnar - ADM 38.5 ?32.0

## 2018-04-24 NOTE — Progress Notes (Signed)
Please refer to EMG and nerve conduction study procedure note. 

## 2018-04-24 NOTE — Procedures (Signed)
     HISTORY:  Trevor [name] is a 66 year old gentleman with a history of a closed head injury in the past, he has a chronic gait disorder, with some recent changes in his ability to ambulate.  The patient is being evaluated for these gait changes.  NERVE CONDUCTION STUDIES:  Nerve conduction studies were performed on the right upper extremity.  The distal motor latency for the right median nerve is prolonged, borderline normal for the right ulnar nerve.  A low motor amplitude was seen for the median nerve, normal for the ulnar nerve.  Slowing was seen for the right median nerve, normal for the ulnar nerve.  The sensory latency for the right radial nerve was prolonged, unobtainable for the right median nerve and prolonged for the right ulnar nerve.  The F-wave latency for the right ulnar nerve was prolonged.  Nerve conduction studies were performed on the right lower extremity.  No response was seen for the peroneal or posterior tibial nerves.  The sensory latencies for the right sural and right peroneal nerves were unobtainable.  EMG STUDIES:  EMG study was performed on the right lower extremity:  The tibialis anterior muscle reveals 2 to 5K motor units with decreased recruitment. No fibrillations or positive waves were seen. The peroneus tertius muscle reveals 2 to 4K motor units with significantly decreased recruitment. No fibrillations or positive waves were seen. The medial gastrocnemius muscle reveals 1 to 3K motor units with decreased recruitment. No fibrillations or positive waves were seen. The vastus lateralis muscle reveals 2 to 4K motor units with full recruitment. No fibrillations or positive waves were seen. The iliopsoas muscle reveals 2 to 4K motor units with full recruitment. No fibrillations or positive waves were seen. The biceps femoris muscle (long head) reveals 2 to 4K motor units with full recruitment. No fibrillations or positive waves were seen. The lumbosacral  paraspinal muscles were tested at 3 levels, and revealed no abnormalities of insertional activity at all 3 levels tested. There was good relaxation.   IMPRESSION:  Nerve conduction studies done on the right upper and right lower extremities shows findings consistent with a primarily axonal peripheral neuropathy of significant severity.  There is an overlying right carpal tunnel syndrome of moderate severity.  EMG evaluation of the right lower extremity shows chronic stable distal signs of denervation consistent with the diagnosis of peripheral neuropathy.  There is no evidence of an overlying lumbosacral radiculopathy.  Jill Alexanders MD 04/24/2018 2:02 PM  [facility] [address] [place], [place] [address]  Phone [phone] Fax [phone]

## 2018-05-01 DIAGNOSIS — I87332 Chronic venous hypertension (idiopathic) with ulcer and inflammation of left lower extremity: Secondary | ICD-10-CM | POA: Diagnosis not present

## 2018-05-01 DIAGNOSIS — I87312 Chronic venous hypertension (idiopathic) with ulcer of left lower extremity: Secondary | ICD-10-CM | POA: Diagnosis not present

## 2018-05-01 DIAGNOSIS — L97322 Non-pressure chronic ulcer of left ankle with fat layer exposed: Secondary | ICD-10-CM | POA: Diagnosis not present

## 2018-05-05 DIAGNOSIS — I87332 Chronic venous hypertension (idiopathic) with ulcer and inflammation of left lower extremity: Secondary | ICD-10-CM | POA: Diagnosis not present

## 2018-05-12 DIAGNOSIS — L03115 Cellulitis of right lower limb: Secondary | ICD-10-CM | POA: Diagnosis not present

## 2018-05-12 DIAGNOSIS — I10 Essential (primary) hypertension: Secondary | ICD-10-CM | POA: Diagnosis not present

## 2018-05-12 DIAGNOSIS — E785 Hyperlipidemia, unspecified: Secondary | ICD-10-CM | POA: Diagnosis not present

## 2018-05-12 DIAGNOSIS — I87332 Chronic venous hypertension (idiopathic) with ulcer and inflammation of left lower extremity: Secondary | ICD-10-CM | POA: Diagnosis not present

## 2018-05-12 DIAGNOSIS — Z9181 History of falling: Secondary | ICD-10-CM | POA: Diagnosis not present

## 2018-05-12 DIAGNOSIS — L97322 Non-pressure chronic ulcer of left ankle with fat layer exposed: Secondary | ICD-10-CM | POA: Diagnosis not present

## 2018-05-16 DIAGNOSIS — M47812 Spondylosis without myelopathy or radiculopathy, cervical region: Secondary | ICD-10-CM | POA: Diagnosis not present

## 2018-05-16 DIAGNOSIS — M542 Cervicalgia: Secondary | ICD-10-CM | POA: Diagnosis not present

## 2018-05-16 DIAGNOSIS — M7130 Other bursal cyst, unspecified site: Secondary | ICD-10-CM | POA: Diagnosis not present

## 2018-05-16 DIAGNOSIS — M5030 Other cervical disc degeneration, unspecified cervical region: Secondary | ICD-10-CM | POA: Diagnosis not present

## 2018-05-16 DIAGNOSIS — I87332 Chronic venous hypertension (idiopathic) with ulcer and inflammation of left lower extremity: Secondary | ICD-10-CM | POA: Diagnosis not present

## 2018-05-16 DIAGNOSIS — L03115 Cellulitis of right lower limb: Secondary | ICD-10-CM | POA: Diagnosis not present

## 2018-05-16 DIAGNOSIS — L97322 Non-pressure chronic ulcer of left ankle with fat layer exposed: Secondary | ICD-10-CM | POA: Diagnosis not present

## 2018-05-18 ENCOUNTER — Encounter (HOSPITAL_BASED_OUTPATIENT_CLINIC_OR_DEPARTMENT_OTHER): Payer: PPO | Attending: Internal Medicine

## 2018-05-18 DIAGNOSIS — Z872 Personal history of diseases of the skin and subcutaneous tissue: Secondary | ICD-10-CM | POA: Insufficient documentation

## 2018-05-18 DIAGNOSIS — I87302 Chronic venous hypertension (idiopathic) without complications of left lower extremity: Secondary | ICD-10-CM | POA: Insufficient documentation

## 2018-05-18 DIAGNOSIS — Z09 Encounter for follow-up examination after completed treatment for conditions other than malignant neoplasm: Secondary | ICD-10-CM | POA: Insufficient documentation

## 2018-05-18 DIAGNOSIS — L97329 Non-pressure chronic ulcer of left ankle with unspecified severity: Secondary | ICD-10-CM | POA: Diagnosis not present

## 2018-05-22 DIAGNOSIS — G608 Other hereditary and idiopathic neuropathies: Secondary | ICD-10-CM | POA: Diagnosis not present

## 2018-05-22 DIAGNOSIS — I878 Other specified disorders of veins: Secondary | ICD-10-CM | POA: Diagnosis not present

## 2018-05-22 DIAGNOSIS — E7849 Other hyperlipidemia: Secondary | ICD-10-CM | POA: Diagnosis not present

## 2018-05-22 DIAGNOSIS — I10 Essential (primary) hypertension: Secondary | ICD-10-CM | POA: Diagnosis not present

## 2018-05-22 DIAGNOSIS — R413 Other amnesia: Secondary | ICD-10-CM | POA: Diagnosis not present

## 2018-05-22 DIAGNOSIS — Z1389 Encounter for screening for other disorder: Secondary | ICD-10-CM | POA: Diagnosis not present

## 2018-05-22 DIAGNOSIS — F330 Major depressive disorder, recurrent, mild: Secondary | ICD-10-CM | POA: Diagnosis not present

## 2018-05-22 DIAGNOSIS — G894 Chronic pain syndrome: Secondary | ICD-10-CM | POA: Diagnosis not present

## 2018-05-22 DIAGNOSIS — Z6830 Body mass index (BMI) 30.0-30.9, adult: Secondary | ICD-10-CM | POA: Diagnosis not present

## 2018-05-22 DIAGNOSIS — I714 Abdominal aortic aneurysm, without rupture: Secondary | ICD-10-CM | POA: Diagnosis not present

## 2018-05-22 DIAGNOSIS — M75101 Unspecified rotator cuff tear or rupture of right shoulder, not specified as traumatic: Secondary | ICD-10-CM | POA: Diagnosis not present

## 2018-05-22 DIAGNOSIS — R358 Other polyuria: Secondary | ICD-10-CM | POA: Diagnosis not present

## 2018-05-23 DIAGNOSIS — M14671 Charcot's joint, right ankle and foot: Secondary | ICD-10-CM | POA: Diagnosis not present

## 2018-05-23 DIAGNOSIS — M79671 Pain in right foot: Secondary | ICD-10-CM | POA: Diagnosis not present

## 2018-05-23 DIAGNOSIS — M25571 Pain in right ankle and joints of right foot: Secondary | ICD-10-CM | POA: Diagnosis not present

## 2018-05-29 DIAGNOSIS — N401 Enlarged prostate with lower urinary tract symptoms: Secondary | ICD-10-CM | POA: Diagnosis not present

## 2018-05-29 DIAGNOSIS — R3915 Urgency of urination: Secondary | ICD-10-CM | POA: Diagnosis not present

## 2018-06-06 DIAGNOSIS — N3281 Overactive bladder: Secondary | ICD-10-CM | POA: Diagnosis not present

## 2018-06-06 DIAGNOSIS — Z23 Encounter for immunization: Secondary | ICD-10-CM | POA: Diagnosis not present

## 2018-06-06 DIAGNOSIS — R251 Tremor, unspecified: Secondary | ICD-10-CM | POA: Diagnosis not present

## 2018-06-06 DIAGNOSIS — R2689 Other abnormalities of gait and mobility: Secondary | ICD-10-CM | POA: Diagnosis not present

## 2018-06-06 DIAGNOSIS — G894 Chronic pain syndrome: Secondary | ICD-10-CM | POA: Diagnosis not present

## 2018-06-06 DIAGNOSIS — Z6830 Body mass index (BMI) 30.0-30.9, adult: Secondary | ICD-10-CM | POA: Diagnosis not present

## 2018-06-07 ENCOUNTER — Encounter: Payer: Self-pay | Admitting: Neurology

## 2018-06-19 DIAGNOSIS — M14671 Charcot's joint, right ankle and foot: Secondary | ICD-10-CM | POA: Diagnosis not present

## 2018-07-27 DIAGNOSIS — I10 Essential (primary) hypertension: Secondary | ICD-10-CM | POA: Diagnosis not present

## 2018-07-27 DIAGNOSIS — E7849 Other hyperlipidemia: Secondary | ICD-10-CM | POA: Diagnosis not present

## 2018-07-27 DIAGNOSIS — Z125 Encounter for screening for malignant neoplasm of prostate: Secondary | ICD-10-CM | POA: Diagnosis not present

## 2018-08-02 DIAGNOSIS — I10 Essential (primary) hypertension: Secondary | ICD-10-CM | POA: Diagnosis not present

## 2018-08-02 DIAGNOSIS — Z1389 Encounter for screening for other disorder: Secondary | ICD-10-CM | POA: Diagnosis not present

## 2018-08-02 DIAGNOSIS — Z0000 Encounter for general adult medical examination without abnormal findings: Secondary | ICD-10-CM | POA: Diagnosis not present

## 2018-08-02 DIAGNOSIS — F330 Major depressive disorder, recurrent, mild: Secondary | ICD-10-CM | POA: Diagnosis not present

## 2018-08-02 DIAGNOSIS — E7849 Other hyperlipidemia: Secondary | ICD-10-CM | POA: Diagnosis not present

## 2018-08-02 DIAGNOSIS — G608 Other hereditary and idiopathic neuropathies: Secondary | ICD-10-CM | POA: Diagnosis not present

## 2018-08-02 DIAGNOSIS — I129 Hypertensive chronic kidney disease with stage 1 through stage 4 chronic kidney disease, or unspecified chronic kidney disease: Secondary | ICD-10-CM | POA: Diagnosis not present

## 2018-08-02 DIAGNOSIS — R82998 Other abnormal findings in urine: Secondary | ICD-10-CM | POA: Diagnosis not present

## 2018-08-02 DIAGNOSIS — Z6830 Body mass index (BMI) 30.0-30.9, adult: Secondary | ICD-10-CM | POA: Diagnosis not present

## 2018-08-02 DIAGNOSIS — I714 Abdominal aortic aneurysm, without rupture: Secondary | ICD-10-CM | POA: Diagnosis not present

## 2018-08-02 DIAGNOSIS — R413 Other amnesia: Secondary | ICD-10-CM | POA: Diagnosis not present

## 2018-08-02 DIAGNOSIS — Z23 Encounter for immunization: Secondary | ICD-10-CM | POA: Diagnosis not present

## 2018-08-02 DIAGNOSIS — N183 Chronic kidney disease, stage 3 (moderate): Secondary | ICD-10-CM | POA: Diagnosis not present

## 2018-08-02 DIAGNOSIS — I83009 Varicose veins of unspecified lower extremity with ulcer of unspecified site: Secondary | ICD-10-CM | POA: Diagnosis not present

## 2018-08-03 DIAGNOSIS — S29011A Strain of muscle and tendon of front wall of thorax, initial encounter: Secondary | ICD-10-CM | POA: Diagnosis not present

## 2018-08-23 DIAGNOSIS — M7130 Other bursal cyst, unspecified site: Secondary | ICD-10-CM | POA: Diagnosis not present

## 2018-08-23 DIAGNOSIS — M542 Cervicalgia: Secondary | ICD-10-CM | POA: Diagnosis not present

## 2018-08-24 DIAGNOSIS — M47812 Spondylosis without myelopathy or radiculopathy, cervical region: Secondary | ICD-10-CM | POA: Diagnosis not present

## 2018-08-24 DIAGNOSIS — M5030 Other cervical disc degeneration, unspecified cervical region: Secondary | ICD-10-CM | POA: Diagnosis not present

## 2018-08-24 DIAGNOSIS — I10 Essential (primary) hypertension: Secondary | ICD-10-CM | POA: Diagnosis not present

## 2018-09-04 ENCOUNTER — Encounter

## 2018-09-04 ENCOUNTER — Encounter: Payer: Self-pay | Admitting: Neurology

## 2018-09-04 ENCOUNTER — Ambulatory Visit (INDEPENDENT_AMBULATORY_CARE_PROVIDER_SITE_OTHER): Payer: PPO | Admitting: Neurology

## 2018-09-04 VITALS — BP 148/62 | HR 80 | Ht 76.0 in | Wt 250.0 lb

## 2018-09-04 DIAGNOSIS — G5601 Carpal tunnel syndrome, right upper limb: Secondary | ICD-10-CM

## 2018-09-04 DIAGNOSIS — R251 Tremor, unspecified: Secondary | ICD-10-CM

## 2018-09-04 DIAGNOSIS — G603 Idiopathic progressive neuropathy: Secondary | ICD-10-CM
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
## 2018-09-04 NOTE — Patient Instructions (Signed)
Happy Holidays to you!  Please follow-up with orthopaedic surgeon if your right hand numbness (carpal tunnel syndrome) get worse  You do not have Parkinson's disease.

## 2018-09-21 ENCOUNTER — Encounter: Payer: Self-pay | Admitting: Neurology

## 2018-09-21 ENCOUNTER — Telehealth: Payer: Self-pay

## 2018-09-21 ENCOUNTER — Ambulatory Visit (INDEPENDENT_AMBULATORY_CARE_PROVIDER_SITE_OTHER): Payer: Medicare Other | Admitting: Neurology

## 2018-09-21 VITALS — BP 126/64 | HR 87 | Ht 76.0 in | Wt 244.0 lb

## 2018-09-21 DIAGNOSIS — R269 Unspecified abnormalities of gait and mobility: Secondary | ICD-10-CM | POA: Diagnosis not present

## 2018-09-21 DIAGNOSIS — G603 Idiopathic progressive neuropathy: Secondary | ICD-10-CM
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
## 2018-09-21 NOTE — Telephone Encounter (Deleted)
PA([place] limitation) submitted for Oxycarbazepine 300 mg via cover my meds. 300 mg in the am, 300 mg at mid day and 600 mg in the evening. Determination should be reached in 24-72 hours.   Rx # [id number] [place] Case ID: [id number]

## 2018-09-21 NOTE — Telephone Encounter (Signed)
Error

## 2018-10-09 ENCOUNTER — Other Ambulatory Visit: Payer: Self-pay

## 2018-10-09 DIAGNOSIS — I712 Thoracic aortic aneurysm, without rupture, unspecified: Secondary | ICD-10-CM

## 2018-10-20 ENCOUNTER — Other Ambulatory Visit: Payer: Medicare Other

## 2018-10-23 ENCOUNTER — Ambulatory Visit
Admission: RE | Admit: 2018-10-23 | Discharge: 2018-10-23 | Disposition: A | Discharge status: Home / Self Care | Payer: Medicare Other | Source: Ambulatory Visit | Attending: Vascular Surgery | Admitting: Vascular Surgery

## 2018-10-23 DIAGNOSIS — I712 Thoracic aortic aneurysm, without rupture, unspecified: Secondary | ICD-10-CM

## 2018-10-23 MED ORDER — IOPAMIDOL (ISOVUE-370) INJECTION 76%
75.0000 mL | Freq: Once | INTRAVENOUS | Status: AC | PRN
  Administered 2018-10-23: 75 mL via INTRAVENOUS

## 2018-10-24 ENCOUNTER — Ambulatory Visit: Payer: Medicare Other | Admitting: Vascular Surgery

## 2018-10-24 ENCOUNTER — Other Ambulatory Visit: Payer: Self-pay

## 2018-10-24 ENCOUNTER — Encounter: Payer: Self-pay | Admitting: Vascular Surgery

## 2018-10-24 VITALS — BP 126/88 | HR 79 | Resp 18 | Ht 76.0 in | Wt 252.0 lb

## 2018-10-24 DIAGNOSIS — I712 Thoracic aortic aneurysm, without rupture, unspecified: Secondary | ICD-10-CM

## 2018-10-24 DIAGNOSIS — L97329 Non-pressure chronic ulcer of left ankle with unspecified severity: Secondary | ICD-10-CM | POA: Diagnosis not present

## 2018-10-24 DIAGNOSIS — I83023 Varicose veins of left lower extremity with ulcer of ankle: Secondary | ICD-10-CM | POA: Diagnosis not present

## 2018-10-24 NOTE — Progress Notes (Signed)
Vascular and Vein Specialist of [facility]  Patient name: Trevor [name] MRN: [id number] DOB: [birth year] Sex: male  REASON FOR VISIT: Follow-up thoracic stent graft repair for saccular thoracic aneurysm  HPI: Trevor [name] is a 68 y.o. male here today for follow-up.  He has had new difficulty with Charcot foot on the right side.  He had a nonhealing of this is and now is being seen at [facility].  He has had no difficulty regarding his thoracic aneurysm repair.  Does have severe venous stasis disease bilaterally and at our last visit had a venous ulcer in his left leg which is healed.  Past Medical History:  Diagnosis Date  . Acute pain of left shoulder 04/04/2017  . ADD (attention deficit disorder)   . Allergy   . Arthritis    right foot  . Arthritis of midfoot 04/29/2016   Overview:  Added automatically from request for surgery [id number]  . Blood transfusion without reported diagnosis   . Chest wall pain 09/29/2017  . Chronic pain of right ankle 04/08/2016  . Cough due to ACE inhibitor 03/18/2017  . Depression   . Descending thoracic aortic aneurysm ([place]) 04/04/2017   Overview:  4.5 cm on CT scan of April 04, 2017.  . Diverticulosis of colon 11/25/2015  . Dyspnea on exertion 04/04/2017  . Essential hypertension 11/25/2015  . Executive function deficit 12/31/2015  . Family history of colon cancer in mother    dx'd age late 51's  . Foot pain, right 04/08/2016  . H/O blood clots   . Health maintenance examination 09/29/2017  . History of colonic polyps 02/04/2011   3 10 mm hyperplastic polyps removed from right and left colon 2004 4 polyps, one 10 mm others diminutive and hyperplastic 2007 Followed closer than routine risk due to multiple large hyperplastic polyps 5 adenomas with one of them serrated  05/29/2015 11 polyps max 10 mm adenomas, ssa's, hyperplastic - 09/21/2017 3 diminutive polyps removed adenomas - recall 2021 (2 yrs)     . Hypercholesterolemia  11/25/2015  . Hyperlipidemia   . Hypertension   . Left foot pain 01/27/2016  . Nocturia more than twice per night 12/31/2015  . Pain in joints 12/31/2015  . Pectoralis major tendinitis, right 03/18/2017  . Pericarditis   . Peripheral neuropathy 04/24/2018  . Pneumonia   . Primary osteoarthritis of right foot 11/14/2015  . Shortness of breath 09/29/2017  . Status post right foot surgery 06/23/2016  . Strain of right pectoralis muscle 09/07/2016  . Thoracic aortic aneurysm ([place]) 06/08/2017  . Traumatic brain injury ([place])    1976  . Ulcer   . Varicose veins   . Varicose veins of lower extremities with ulcer ([place]) 11/16/2012    Family History  Problem Relation Age of Onset  . Rectal cancer Mother   . Colon cancer Mother        dx'd late 29's - died 16-May-2017  . Bone cancer Sister   . Lung cancer Sister   . Colon polyps Neg Hx     SOCIAL HISTORY: Social History   Tobacco Use  . Smoking status: Former Smoker    Types: Cigarettes    Last attempt to quit: 01/20/1981    Years since quitting: 37.7  . Smokeless tobacco: Never Used  Substance Use Topics  . Alcohol use: Yes    Comment: rare    Allergies  Allergen Reactions  . Terazosin Other (See Comments)    dizziness  Current Outpatient Medications  Medication Sig Dispense Refill  . amLODipine (NORVASC) 5 MG tablet Take 5 mg by mouth daily. Reported on 01/13/2016    . amphetamine-dextroamphetamine (ADDERALL) 30 MG tablet Take 30 mg by mouth 2 (two) times daily.    Marland Kitchen buPROPion (WELLBUTRIN SR) 200 MG 12 hr tablet Take 200 mg by mouth 2 (two) times daily.    Marland Kitchen lovastatin (MEVACOR) 20 MG tablet Take 40 mg by mouth daily.     . mirabegron ER (MYRBETRIQ) 50 MG TB24 tablet Take 50 mg by mouth daily.    Marland Kitchen telmisartan-hydrochlorothiazide (MICARDIS HCT) 80-12.5 MG tablet Take 1 tablet by mouth daily.    . traMADol (ULTRAM) 50 MG tablet Take 50 mg by mouth 3 (three) times daily as needed for moderate pain.     No current facility-administered  medications for this visit.     REVIEW OF SYSTEMS:  [X]  denotes positive finding, [ ]  denotes negative finding Cardiac  Comments:  Chest pain or chest pressure:    Shortness of breath upon exertion:    Short of breath when lying flat:    Irregular heart rhythm:        Vascular    Pain in calf, thigh, or hip brought on by ambulation:    Pain in feet at night that wakes you up from your sleep:     Blood clot in your veins:    Leg swelling:  x         PHYSICAL EXAM: Vitals:   10/24/18 1531  BP: 126/88  Pulse: 79  Resp: 18  SpO2: 95%  Weight: 252 lb (114.3 kg)  Height: 6\' 4"  (1.93 m)    GENERAL: The patient is a well-nourished male, in no acute distress. The vital signs are documented above. CARDIOVASCULAR: Large boot in place from his knee down to his foot on the right.  Has a 2-3+ right popliteal pulse. PULMONARY: There is good air exchange  MUSCULOSKELETAL: There are no major deformities or cyanosis. NEUROLOGIC: No focal weakness or paresthesias are detected. SKIN: There are no ulcers or rashes noted. PSYCHIATRIC: The patient has a normal affect.  DATA:  CT of his chest from yesterday was reviewed.  This shows excellent positioning of his thoracic stent graft with no flow into his saccular aneurysm and no enlargement in the saccular aneurysm  MEDICAL ISSUES: Stable overall.  Will continue his usual activities.  Again stressed the importance of lifelong graduated compression garment use to reduce risk for recurrence of his venous ulcer.  We will see him again in 2 years with CT scan follow-up of his thoracic aneurysm stent graft    Rosetta Posner, MD FACS [facility] Tel [phone] Pager [phone]

## 2018-11-07 DIAGNOSIS — M79671 Pain in right foot: Secondary | ICD-10-CM | POA: Diagnosis not present

## 2018-11-07 DIAGNOSIS — M14671 Charcot's joint, right ankle and foot: Secondary | ICD-10-CM | POA: Diagnosis not present

## 2018-12-06 DIAGNOSIS — R351 Nocturia: Secondary | ICD-10-CM | POA: Diagnosis not present

## 2018-12-06 DIAGNOSIS — N401 Enlarged prostate with lower urinary tract symptoms: Secondary | ICD-10-CM | POA: Diagnosis not present

## 2019-02-19 ENCOUNTER — Other Ambulatory Visit: Payer: Self-pay

## 2019-02-19 ENCOUNTER — Ambulatory Visit: Payer: Medicare Other | Attending: Neurology | Admitting: Physical Therapy

## 2019-02-19 DIAGNOSIS — M6281 Muscle weakness (generalized): Secondary | ICD-10-CM | POA: Diagnosis not present

## 2019-02-19 DIAGNOSIS — R2689 Other abnormalities of gait and mobility: Secondary | ICD-10-CM | POA: Diagnosis not present

## 2019-02-19 DIAGNOSIS — R2681 Unsteadiness on feet: Secondary | ICD-10-CM | POA: Diagnosis not present
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
## 2019-02-26 ENCOUNTER — Other Ambulatory Visit: Payer: Self-pay

## 2019-02-26 ENCOUNTER — Ambulatory Visit: Payer: Medicare Other | Admitting: Physical Therapy

## 2019-02-26 DIAGNOSIS — R2681 Unsteadiness on feet: Secondary | ICD-10-CM | POA: Diagnosis not present

## 2019-02-26 DIAGNOSIS — M6281 Muscle weakness (generalized): Secondary | ICD-10-CM | POA: Diagnosis not present

## 2019-02-26 DIAGNOSIS — R2689 Other abnormalities of gait and mobility: Secondary | ICD-10-CM | POA: Diagnosis not present

## 2019-02-27 NOTE — Therapy (Signed)
[facility] [address] [place], [place], [address] Phone: [phone]   Fax:  [phone]  Physical Therapy Treatment  Patient Details  Name: Trevor [name] MRN: [id number] Date of Birth: [birth year] Referring Provider (PT): Dt. Floyde Parkins  CLINIC OPERATION CHANGES: Outpatient [facility] is open at lower capacity following universal masking, social distancing, and patient screening.  The patient's COVID risk of  complications score is 1.  Encounter Date: 02/26/2019  PT End of Session - 02/27/19 2024    Visit Number  2    Number of Visits  17    Date for PT Re-Evaluation  04/21/19    Authorization Type  Medicare/BCBS    Authorization Time Period  02-19-19 - 05-19-19    PT Start Time  1301    PT Stop Time  1348    PT Time Calculation (min)  47 min    Equipment Utilized During Treatment  Other (comment)   CROW boot on RLE due to Charcot arthropathy Rt foot   Activity Tolerance  Patient tolerated treatment well    Behavior During Therapy  WFL for tasks assessed/performed       Past Medical History:  Diagnosis Date  . Acute pain of left shoulder 04/04/2017  . ADD (attention deficit disorder)   . Allergy   . Arthritis    right foot  . Arthritis of midfoot 04/29/2016   Overview:  Added automatically from request for surgery [id number]  . Blood transfusion without reported diagnosis   . Chest wall pain 09/29/2017  . Chronic pain of right ankle 04/08/2016  . Cough due to ACE inhibitor 03/18/2017  . Depression   . Descending thoracic aortic aneurysm ([place]) 04/04/2017   Overview:  4.5 cm on CT scan of April 04, 2017.  . Diverticulosis of colon 11/25/2015  . Dyspnea on exertion 04/04/2017  . Essential hypertension 11/25/2015  . Executive function deficit 12/31/2015  . Family history of colon cancer in mother    dx'd age late 74's  . Foot pain, right 04/08/2016  . H/O blood clots   . Health maintenance examination 09/29/2017  .  History of colonic polyps 02/04/2011   3 10 mm hyperplastic polyps removed from right and left colon 2004 4 polyps, one 10 mm others diminutive and hyperplastic 2007 Followed closer than routine risk due to multiple large hyperplastic polyps 5 adenomas with one of them serrated  05/29/2015 11 polyps max 10 mm adenomas, ssa's, hyperplastic - 09/21/2017 3 diminutive polyps removed adenomas - recall 2021 (2 yrs)     . Hypercholesterolemia 11/25/2015  . Hyperlipidemia   . Hypertension   . Left foot pain 01/27/2016  . Nocturia more than twice per night 12/31/2015  . Pain in joints 12/31/2015  . Pectoralis major tendinitis, right 03/18/2017  . Pericarditis   . Peripheral neuropathy 04/24/2018  . Pneumonia   . Primary osteoarthritis of right foot 11/14/2015  . Shortness of breath 09/29/2017  . Status post right foot surgery 06/23/2016  . Strain of right pectoralis muscle 09/07/2016  . Thoracic aortic aneurysm ([place]) 06/08/2017  . Traumatic brain injury ([place])    1976  . Ulcer   . Varicose veins   . Varicose veins of lower extremities with ulcer ([place]) 11/16/2012    Past Surgical History:  Procedure Laterality Date  . BURR HOLE OF CRANIUM  04/10/75  . COLONOSCOPY    . COLONOSCOPY W/ POLYPECTOMY  2004, 2007, 02/03/11   multiple large hyperplastic polyps 04/07, adenomas  and one serrated 2012  . ENDOVENOUS ABLATION SAPHENOUS VEIN W/ LASER Left 09-02-2010   EVLA left great and small saphenous vein by Curt Jews MD   . FOOT FUSION Right    [place]  . neck fusion    . POLYPECTOMY    . rt. knee replacement    . THORACIC AORTIC ENDOVASCULAR STENT GRAFT N/A 06/08/2017   Procedure: THORACIC AORTIC ENDOVASCULAR STENT GRAFT;  Surgeon: Rosetta Posner, MD;  Location: [facility] OR;  Service: Vascular;  Laterality: N/A;    There were no vitals filed for this visit.  Subjective Assessment - 02/26/19 1304    Subjective  Pt states he turns left foot in and this has gotten progressively worse over past 12 months; is  wearing boot on RLE due to Charcot [name] disease; pt using SPC for assistance with community ambulation    Patient is accompained by:  Family member    Patient Stated Goals  Improve balance and walking    Currently in Pain?  No/denies                       [facility] Adult PT Treatment/Exercise - 02/27/19 0001      Transfers   Transfers  Sit to Stand    Sit to Stand  5: Supervision    Number of Reps  Other reps (comment)   5   Comments  UE support needed      Ambulation/Gait   Ambulation/Gait  Yes    Ambulation/Gait Assistance  5: Supervision    Ambulation/Gait Assistance Details  CROW boot on RLE    Ambulation Distance (Feet)  115 Feet    Assistive device  Straight cane    Gait Pattern  Step-through pattern    Ambulation Surface  Level;Indoor      Exercises   Exercises  Knee/Hip      Knee/Hip Exercises: Standing   Hip Flexion  Stengthening;Both;1 set;10 reps;Knee bent;Knee straight   with green therband   Hip Abduction  Stengthening;Both;1 set;10 reps;Knee straight   with green band   Hip Extension  Stengthening;Both;1 set;10 reps;Knee straight   with green band         Balance Exercises - 02/27/19 2020      Balance Exercises: Standing   Other Standing Exercises  Pt performed standing balance exercises for HEP including forward, back and side kicks; marching in place; marching forward along // bar for support prn, sidestepping and crossovers front with UE support prn         PT Education - 02/27/19 2024    Education Details  HEP - balance and strengthening hip exs with green band    Person(s) Educated  Patient    Methods  Explanation;Demonstration;Handout    Comprehension  Verbalized understanding;Returned demonstration       PT Short Term Goals - 02/27/19 2030      PT SHORT TERM GOAL #1   Title  Improve Berg balance test score from 36/56 to >/= 41/56 to decrease fall risk.    Baseline  36/56 on 02-19-19    Time  4    Period  Weeks     Status  New    Target Date  03/21/19      PT SHORT TERM GOAL #2   Title  Improve TUG score from 20 secs with SPC to </= 16.0 secs with SPC for reduced fall risk.    Baseline  20.00 secs with SPC - 02-19-19  Time  4    Period  Weeks    Status  New    Target Date  03/21/19      PT SHORT TERM GOAL #3   Title  Increase gait velocity from 2.26 ft/sec to >/= 2.60 ft/sec with SPC for incr. gait efficiency.     Baseline  14.53 secs = 2.26 ft/sec with SPC on 02-19-19    Time  4    Period  Weeks    Status  New    Target Date  03/21/19      PT SHORT TERM GOAL #4   Title  Independent in HEP for balance & strengthening exercises.    Time  4    Period  Weeks    Status  New    Target Date  03/21/19        PT Long Term Goals - 02/20/19 2147      PT LONG TERM GOAL #1   Title  Increase Berg score to >/= 44/56 for reduced fall risk.    Baseline  36/56 on 02-19-19    Time  8    Status  New    Target Date  04/21/19      PT LONG TERM GOAL #2   Title  Improve TUG score to </= 13.0 secs with SPC to decrease fall risk.    Baseline  20.00 secs with SPC on 02-19-19    Time  8    Period  Weeks    Status  New    Target Date  04/21/19      PT LONG TERM GOAL #3   Title  Pt will perform sit to stand 5 times without UE support from mat table to demo improved LE strength.    Time  8    Period  Weeks    Status  New    Target Date  04/21/19      PT LONG TERM GOAL #4   Title  Increase gait speed to >/= 3.0 ft/sec with use of SPC for incr. gait efficiency.    Baseline  14.53 secs = 2.26 ft/sec with SPC on 02-19-19    Time  8    Period  Weeks    Status  New    Target Date  04/21/19            Plan - 02/27/19 2026    Clinical Impression Statement  Pt requested seated rest breaks while performing standing hip strengthening exs. w/green band due to c/o hip discomfort; pt's posture improved with more upright & erect posture noted during gait    Personal Factors and Comorbidities  Past/Current  Experience;Comorbidity 2;Transportation;Time since onset of injury/illness/exacerbation    Examination-Activity Limitations  Bend;Carry;Lift;Stairs;Transfers;Locomotion Level;Squat    Examination-Participation Restrictions  Cleaning;Community Activity;Driving;Interpersonal Relationship;Shop    Stability/Clinical Decision Making  Evolving/Moderate complexity    Rehab Potential  Good    PT Frequency  2x / week    PT Duration  8 weeks    PT Treatment/Interventions  ADLs/Self Care Home Management;Gait training;Stair training;Therapeutic activities;Therapeutic exercise;Balance training;Neuromuscular re-education;Patient/family education;DME Instruction    PT Next Visit Plan  begin balance HEP; sit to stand; hip ext strengthening - with theraband    PT Home Exercise Plan  see above    Consulted and Agree with Plan of Care  Patient;Family member/caregiver    Family Member Consulted  wife Shirlean Mylar       Patient will benefit from skilled therapeutic intervention in order to improve the following  deficits and impairments:  Abnormal gait, Decreased activity tolerance, Decreased balance, Decreased coordination, Decreased strength, Impaired sensation, Postural dysfunction  Visit Diagnosis: 1. Other abnormalities of gait and mobility   2. Muscle weakness (generalized)   3. Unsteadiness on feet        Problem List Patient Active Problem List   Diagnosis Date Noted  . Peripheral neuropathy 04/24/2018  . Chest wall pain 09/29/2017  . Shortness of breath 09/29/2017  . Health maintenance examination 09/29/2017  . Thoracic aortic aneurysm ([place]) 06/08/2017  . Acute pain of left shoulder 04/04/2017  . Descending thoracic aortic aneurysm ([place]) 04/04/2017  . Dyspnea on exertion 04/04/2017  . Cough due to ACE inhibitor 03/18/2017  . Pectoralis major tendinitis, right 03/18/2017  . Strain of right pectoralis muscle 09/07/2016  . Status post right foot surgery 06/23/2016  . Arthritis of midfoot 04/29/2016   . Chronic pain of right ankle 04/08/2016  . Foot pain, right 04/08/2016  . Left foot pain 01/27/2016  . Executive function deficit 12/31/2015  . Nocturia more than twice per night 12/31/2015  . Pain in joints 12/31/2015  . ADD (attention deficit disorder) 11/25/2015  . Depression 11/25/2015  . Diverticulosis of colon 11/25/2015  . Essential hypertension 11/25/2015  . Hypercholesterolemia 11/25/2015  . Primary osteoarthritis of right foot 11/14/2015  . Family history of colon cancer in elderly mother 05/29/2015  . Varicose veins of lower extremities with ulcer ([place]) 11/16/2012  . History of colonic polyps 02/04/2011    Alda Lea, PT 02/27/2019, 8:35 PM  [facility] [address] [place], [place], [address] Phone: [phone]   Fax:  [phone]  Name: CHANE COWDEN MRN: [id number] Date of Birth: 1950/10/05

## 2019-03-05 ENCOUNTER — Ambulatory Visit: Payer: Medicare Other | Admitting: Physical Therapy

## 2019-03-05 ENCOUNTER — Other Ambulatory Visit: Payer: Self-pay

## 2019-03-05 DIAGNOSIS — R2681 Unsteadiness on feet: Secondary | ICD-10-CM | POA: Diagnosis not present

## 2019-03-05 DIAGNOSIS — R2689 Other abnormalities of gait and mobility: Secondary | ICD-10-CM

## 2019-03-05 DIAGNOSIS — M6281 Muscle weakness (generalized): Secondary | ICD-10-CM | POA: Diagnosis not present

## 2019-03-06 NOTE — Therapy (Signed)
[facility] [address] [place], [place], [address] Phone: [phone]   Fax:  [phone]  Physical Therapy Treatment  Patient Details  Name: Trevor [name] MRN: [id number] Date of Birth: 04/01/1951 Referring Provider (PT): Dt. Floyde Parkins  CLINIC OPERATION CHANGES: Outpatient [facility]hab is open at lower capacity following universal masking, social distancing, and patient screening.  The patient's COVID risk of complications score is 3.   Encounter Date: 03/05/2019  PT End of Session - 03/06/19 2020    Visit Number  3    Number of Visits  17    Date for PT Re-Evaluation  04/21/19    Authorization Type  Medicare/BCBS    Authorization Time Period  02-19-19 - 05-19-19    PT Start Time  1702    PT Stop Time  1750    PT Time Calculation (min)  48 min    Equipment Utilized During Treatment  Other (comment)   CROW boot on RLE due to Charcot arthropathy Rt foot   Activity Tolerance  Patient tolerated treatment well    Behavior During Therapy  WFL for tasks assessed/performed       Past Medical History:  Diagnosis Date  . Acute pain of left shoulder 04/04/2017  . ADD (attention deficit disorder)   . Allergy   . Arthritis    right foot  . Arthritis of midfoot 04/29/2016   Overview:  Added automatically from request for surgery [id number]  . Blood transfusion without reported diagnosis   . Chest wall pain 09/29/2017  . Chronic pain of right ankle 04/08/2016  . Cough due to ACE inhibitor 03/18/2017  . Depression   . Descending thoracic aortic aneurysm ([place]) 04/04/2017   Overview:  4.5 cm on CT scan of April 04, 2017.  . Diverticulosis of colon 11/25/2015  . Dyspnea on exertion 04/04/2017  . Essential hypertension 11/25/2015  . Executive function deficit 12/31/2015  . Family history of colon cancer in mother    dx'd age late 39's  . Foot pain, right 04/08/2016  . H/O blood clots   . Health maintenance examination 09/29/2017  .  History of colonic polyps 02/04/2011   3 10 mm hyperplastic polyps removed from right and left colon 2004 4 polyps, one 10 mm others diminutive and hyperplastic 2007 Followed closer than routine risk due to multiple large hyperplastic polyps 5 adenomas with one of them serrated  05/29/2015 11 polyps max 10 mm adenomas, ssa's, hyperplastic - 09/21/2017 3 diminutive polyps removed adenomas - recall 2021 (2 yrs)     . Hypercholesterolemia 11/25/2015  . Hyperlipidemia   . Hypertension   . Left foot pain 01/27/2016  . Nocturia more than twice per night 12/31/2015  . Pain in joints 12/31/2015  . Pectoralis major tendinitis, right 03/18/2017  . Pericarditis   . Peripheral neuropathy 04/24/2018  . Pneumonia   . Primary osteoarthritis of right foot 11/14/2015  . Shortness of breath 09/29/2017  . Status post right foot surgery 06/23/2016  . Strain of right pectoralis muscle 09/07/2016  . Thoracic aortic aneurysm ([place]) 06/08/2017  . Traumatic brain injury ([place])    1976  . Ulcer   . Varicose veins   . Varicose veins of lower extremities with ulcer ([place]) 11/16/2012    Past Surgical History:  Procedure Laterality Date  . BURR HOLE OF CRANIUM  04/10/75  . COLONOSCOPY    . COLONOSCOPY W/ POLYPECTOMY  2004, 2007, 02/03/11   multiple large hyperplastic polyps 04/07, adenomas  and one serrated 2012  . ENDOVENOUS ABLATION SAPHENOUS VEIN W/ LASER Left 09-02-2010   EVLA left great and small saphenous vein by Trevor [name]   . FOOT FUSION Right    Trevor [name]  . neck fusion    . POLYPECTOMY    . rt. knee replacement    . THORACIC AORTIC ENDOVASCULAR STENT GRAFT N/A 06/08/2017   Procedure: THORACIC AORTIC ENDOVASCULAR STENT GRAFT;  Surgeon: Trevor Posner, [name];  Location: [facility] OR;  Service: Vascular;  Laterality: N/A;    There were no vitals filed for this visit.  Subjective Assessment - 03/05/19 1705    Subjective  Pt states he turns left foot in and this has gotten progressively worse over past 12 months; is  wearing boot on RLE due to Charcot Trevor [name] disease; pt using SPC for assistance with community ambulation    Patient is accompained by:  Family member    Patient Stated Goals  Improve balance and walking    Currently in Pain?  No/denies              Floor to stand transfers with UE support on mat table - 2 reps with CGA and cues for sequence  Tall kneeling - small squats back onto heels for hip extensor strengthening 10 reps         [facility] Adult PT Treatment/Exercise - 03/06/19 0001      Transfers   Transfers  Sit to Stand    Sit to Stand  5: Supervision    Number of Reps  Other reps (comment)   5   Comments  bil. UE support used for sit to stand from mat   cues to lean forward -- "nose over toes"     Ambulation/Gait   Ambulation/Gait  Yes    Ambulation/Gait Assistance Details  CROW boot on RLE    Ambulation Distance (Feet)  230 Feet    Assistive device  Straight cane    Gait Pattern  Step-through pattern    Ambulation Surface  Level;Indoor      Knee/Hip Exercises: Supine   Bridges  AROM;Both;1 set;5 reps    Other Supine Knee/Hip Exercises  Bridging with LLE extension 5 reps:  bridging with marching 5 reps:  bridging with hip abdct/adduction 5 reps        Neuro Re-ed:  Standing balance exercises - marching in place - chair in front for UE support prn 10 reps each leg Stepping over and back of orange hurdle with LLE 10 reps with min to mod hand held assist  (NOT done with RLE due to pt wearing CROW boot)  Touching 3 balance bubbles with each foot - with mod hand held assist 5 reps each foot    PT Education - 03/06/19 2019    Education Details  educated pt and wife in correct technique for floor to stand transfer with UE support on object for assistance    Person(s) Educated  Patient;Spouse    Methods  Explanation;Demonstration    Comprehension  Verbalized understanding;Returned demonstration       PT Short Term Goals - 03/06/19 2024      PT SHORT  TERM GOAL #1   Title  Improve Berg balance test score from 36/56 to >/= 41/56 to decrease fall risk.    Baseline  36/56 on 02-19-19    Time  4    Period  Weeks    Status  New    Target Date  03/21/19  PT SHORT TERM GOAL #2   Title  Improve TUG score from 20 secs with SPC to </= 16.0 secs with SPC for reduced fall risk.    Baseline  20.00 secs with SPC - 02-19-19    Time  4    Period  Weeks    Status  New    Target Date  03/21/19      PT SHORT TERM GOAL #3   Title  Increase gait velocity from 2.26 ft/sec to >/= 2.60 ft/sec with SPC for incr. gait efficiency.     Baseline  14.53 secs = 2.26 ft/sec with SPC on 02-19-19    Time  4    Period  Weeks    Status  New    Target Date  03/21/19      PT SHORT TERM GOAL #4   Title  Independent in HEP for balance & strengthening exercises.    Time  4    Period  Weeks    Status  New    Target Date  03/21/19        PT Long Term Goals - 03/06/19 2024      PT LONG TERM GOAL #1   Title  Increase Berg score to >/= 44/56 for reduced fall risk.    Baseline  36/56 on 02-19-19    Time  8    Status  New      PT LONG TERM GOAL #2   Title  Improve TUG score to </= 13.0 secs with SPC to decrease fall risk.    Baseline  20.00 secs with SPC on 02-19-19    Time  8    Period  Weeks    Status  New      PT LONG TERM GOAL #3   Title  Pt will perform sit to stand 5 times without UE support from mat table to demo improved LE strength.    Time  8    Period  Weeks    Status  New      PT LONG TERM GOAL #4   Title  Increase gait speed to >/= 3.0 ft/sec with use of SPC for incr. gait efficiency.    Baseline  14.53 secs = 2.26 ft/sec with SPC on 02-19-19    Time  8    Period  Weeks    Status  New            Plan - 03/06/19 2021    Clinical Impression Statement  Pt demonstrated improvement on 2nd rep of floor to stand transfer with bil. UE support on mat for assistance;  continues to need cues to lean anteriorly for sit to stand transfers to  facilitate increased weight bearing on bil. LE's and less weight bearing on UE's    Personal Factors and Comorbidities  Past/Current Experience;Comorbidity 2;Transportation;Time since onset of injury/illness/exacerbation    Examination-Activity Limitations  Bend;Carry;Lift;Stairs;Transfers;Locomotion Level;Squat    Examination-Participation Restrictions  Cleaning;Community Activity;Driving;Interpersonal Relationship;Shop    Stability/Clinical Decision Making  Evolving/Moderate complexity    Rehab Potential  Good    PT Frequency  2x / week    PT Duration  8 weeks    PT Treatment/Interventions  ADLs/Self Care Home Management;Gait training;Stair training;Therapeutic activities;Therapeutic exercise;Balance training;Neuromuscular re-education;Patient/family education;DME Instruction    PT Next Visit Plan  begin balance HEP; sit to stand; hip ext strengthening - with theraband    PT Home Exercise Plan  see above    Consulted and Agree with Plan of Care  Patient;Family  member/caregiver    Family Member Consulted  wife Trevor [name]       Patient will benefit from skilled therapeutic intervention in order to improve the following deficits and impairments:  Abnormal gait, Decreased activity tolerance, Decreased balance, Decreased coordination, Decreased strength, Impaired sensation, Postural dysfunction  Visit Diagnosis: 1. Other abnormalities of gait and mobility   2. Muscle weakness (generalized)   3. Unsteadiness on feet        Problem List Patient Active Problem List   Diagnosis Date Noted  . Peripheral neuropathy 04/24/2018  . Chest wall pain 09/29/2017  . Shortness of breath 09/29/2017  . Health maintenance examination 09/29/2017  . Thoracic aortic aneurysm ([place]) 06/08/2017  . Acute pain of left shoulder 04/04/2017  . Descending thoracic aortic aneurysm ([place]) 04/04/2017  . Dyspnea on exertion 04/04/2017  . Cough due to ACE inhibitor 03/18/2017  . Pectoralis major tendinitis, right  03/18/2017  . Strain of right pectoralis muscle 09/07/2016  . Status post right foot surgery 06/23/2016  . Arthritis of midfoot 04/29/2016  . Chronic pain of right ankle 04/08/2016  . Foot pain, right 04/08/2016  . Left foot pain 01/27/2016  . Executive function deficit 12/31/2015  . Nocturia more than twice per night 12/31/2015  . Pain in joints 12/31/2015  . ADD (attention deficit disorder) 11/25/2015  . Depression 11/25/2015  . Diverticulosis of colon 11/25/2015  . Essential hypertension 11/25/2015  . Hypercholesterolemia 11/25/2015  . Primary osteoarthritis of right foot 11/14/2015  . Family history of colon cancer in elderly mother 05/29/2015  . Varicose veins of lower extremities with ulcer ([place]) 11/16/2012  . History of colonic polyps 02/04/2011    Alda Lea, PT 03/06/2019, 8:27 PM  [facility] [address] [place], [place], [address] Phone: [phone]   Fax:  [phone]  Name: Trevor [name] MRN: [id number] Date of Birth: 05-Nov-1950

## 2019-03-07 DIAGNOSIS — I10 Essential (primary) hypertension: Secondary | ICD-10-CM | POA: Diagnosis not present

## 2019-03-07 DIAGNOSIS — M14679 Charcot's joint, unspecified ankle and foot: Secondary | ICD-10-CM | POA: Diagnosis not present

## 2019-03-07 DIAGNOSIS — N3281 Overactive bladder: Secondary | ICD-10-CM | POA: Diagnosis not present

## 2019-03-07 DIAGNOSIS — I714 Abdominal aortic aneurysm, without rupture: Secondary | ICD-10-CM | POA: Diagnosis not present

## 2019-03-07 DIAGNOSIS — R451 Restlessness and agitation: Secondary | ICD-10-CM | POA: Diagnosis not present

## 2019-03-12 ENCOUNTER — Ambulatory Visit: Payer: Medicare Other | Admitting: Physical Therapy

## 2019-03-12 ENCOUNTER — Other Ambulatory Visit: Payer: Self-pay

## 2019-03-12 DIAGNOSIS — M6281 Muscle weakness (generalized): Secondary | ICD-10-CM

## 2019-03-12 DIAGNOSIS — R2689 Other abnormalities of gait and mobility: Secondary | ICD-10-CM | POA: Diagnosis not present

## 2019-03-12 DIAGNOSIS — R2681 Unsteadiness on feet: Secondary | ICD-10-CM
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
## 2019-03-20 ENCOUNTER — Ambulatory Visit: Payer: Medicare Other | Attending: Neurology | Admitting: Physical Therapy

## 2019-03-20 ENCOUNTER — Other Ambulatory Visit: Payer: Self-pay

## 2019-03-20 DIAGNOSIS — R2689 Other abnormalities of gait and mobility: Secondary | ICD-10-CM | POA: Diagnosis not present

## 2019-03-20 DIAGNOSIS — R2681 Unsteadiness on feet: Secondary | ICD-10-CM | POA: Diagnosis not present

## 2019-03-20 DIAGNOSIS — M6281 Muscle weakness (generalized): Secondary | ICD-10-CM | POA: Insufficient documentation
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
## 2019-03-23 ENCOUNTER — Encounter

## 2019-03-26 ENCOUNTER — Other Ambulatory Visit: Payer: Self-pay

## 2019-03-26 ENCOUNTER — Ambulatory Visit: Payer: Medicare Other | Admitting: Physical Therapy

## 2019-03-26 DIAGNOSIS — R2681 Unsteadiness on feet: Secondary | ICD-10-CM | POA: Diagnosis not present

## 2019-03-26 DIAGNOSIS — R2689 Other abnormalities of gait and mobility: Secondary | ICD-10-CM

## 2019-03-26 DIAGNOSIS — M6281 Muscle weakness (generalized): Secondary | ICD-10-CM
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
## 2019-03-29 ENCOUNTER — Encounter: Payer: Self-pay | Admitting: Neurology

## 2019-03-29 ENCOUNTER — Other Ambulatory Visit: Payer: Self-pay

## 2019-03-29 ENCOUNTER — Ambulatory Visit (INDEPENDENT_AMBULATORY_CARE_PROVIDER_SITE_OTHER): Payer: Medicare Other | Admitting: Neurology

## 2019-03-29 VITALS — BP 137/86 | HR 73 | Temp 97.1°F | Ht 76.0 in | Wt 240.0 lb

## 2019-03-29 DIAGNOSIS — G603 Idiopathic progressive neuropathy: Secondary | ICD-10-CM

## 2019-03-29 DIAGNOSIS — R269 Unspecified abnormalities of gait and mobility: Secondary | ICD-10-CM | POA: Diagnosis not present

## 2019-03-29 DIAGNOSIS — M21372 Foot drop, left foot: Secondary | ICD-10-CM | POA: Diagnosis not present

## 2019-03-29 HISTORY — DX: Unspecified abnormalities of gait and mobility: R26.9

## 2019-03-29 NOTE — Progress Notes (Signed)
Reason for visit: Gait disturbance, closed head injury  Trevor [name] is an 68 y.o. male  History of present illness:  Trevor [name] is a 68 year old right-handed white male with a history of a prior closed head injury associated with a mild left hemiparesis.  The patient has an associated multifactorial gait disorder secondary to the head injury, left foot drop, peripheral neuropathy, and a Charcot joint affecting the right foot and ankle.  The patient has also had a right total knee replacement and he has degenerative arthritis affecting the left hip and shoulders.  The patient currently is in physical therapy for leg strengthening exercises and balance training which seems to be helping quite a bit.  The patient does not have any pain and discomfort in the feet associated with the neuropathy.  He does tend to turn the left foot in and may walk on the side of the foot at times.  The patient does have a left foot drop.  The patient uses a cane for ambulation, he has fallen on 2 occasions since last seen, both occasions he was not using a cane or walking stick.  He returns for further evaluation.  Past Medical History:  Diagnosis Date  . Acute pain of left shoulder 04/04/2017  . ADD (attention deficit disorder)   . Allergy   . Arthritis    right foot  . Arthritis of midfoot 04/29/2016   Overview:  Added automatically from request for surgery [id number]  . Blood transfusion without reported diagnosis   . Chest wall pain 09/29/2017  . Chronic pain of right ankle 04/08/2016  . Cough due to ACE inhibitor 03/18/2017  . Depression   . Descending thoracic aortic aneurysm ([place]) 04/04/2017   Overview:  4.5 cm on CT scan of April 04, 2017.  . Diverticulosis of colon 11/25/2015  . Dyspnea on exertion 04/04/2017  . Essential hypertension 11/25/2015  . Executive function deficit 12/31/2015  . Family history of colon cancer in mother    dx'd age late 20's  . Foot pain, right 04/08/2016  . Gait abnormality  03/29/2019  . H/O blood clots   . Health maintenance examination 09/29/2017  . History of colonic polyps 02/04/2011   3 10 mm hyperplastic polyps removed from right and left colon 2004 4 polyps, one 10 mm others diminutive and hyperplastic 2007 Followed closer than routine risk due to multiple large hyperplastic polyps 5 adenomas with one of them serrated  05/29/2015 11 polyps max 10 mm adenomas, ssa's, hyperplastic - 09/21/2017 3 diminutive polyps removed adenomas - recall 2021 (2 yrs)     . Hypercholesterolemia 11/25/2015  . Hyperlipidemia   . Hypertension   . Left foot pain 01/27/2016  . Nocturia more than twice per night 12/31/2015  . Pain in joints 12/31/2015  . Pectoralis major tendinitis, right 03/18/2017  . Pericarditis   . Peripheral neuropathy 04/24/2018  . Pneumonia   . Primary osteoarthritis of right foot 11/14/2015  . Shortness of breath 09/29/2017  . Status post right foot surgery 06/23/2016  . Strain of right pectoralis muscle 09/07/2016  . Thoracic aortic aneurysm ([place]) 06/08/2017  . Traumatic brain injury ([place])    1976  . Ulcer   . Varicose veins   . Varicose veins of lower extremities with ulcer ([place]) 11/16/2012    Past Surgical History:  Procedure Laterality Date  . BURR HOLE OF CRANIUM  04/10/75  . COLONOSCOPY    . COLONOSCOPY W/ POLYPECTOMY  2004, 2007, 02/03/11  multiple large hyperplastic polyps 04/07, adenomas and one serrated 2012  . ENDOVENOUS ABLATION SAPHENOUS VEIN W/ LASER Left 09-02-2010   EVLA left great and small saphenous vein by Curt Jews MD   . FOOT FUSION Right    [place]  . neck fusion    . POLYPECTOMY    . rt. knee replacement    . THORACIC AORTIC ENDOVASCULAR STENT GRAFT N/A 06/08/2017   Procedure: THORACIC AORTIC ENDOVASCULAR STENT GRAFT;  Surgeon: Rosetta Posner, MD;  Location: [facility] OR;  Service: Vascular;  Laterality: N/A;    Family History  Problem Relation Age of Onset  . Rectal cancer Mother   . Colon cancer Mother        dx'd late  32's - died 01-Jun-2017  . Bone cancer Sister   . Lung cancer Sister   . Colon polyps Neg Hx     Social history:  reports that he quit smoking about 38 years ago. His smoking use included cigarettes. He has never used smokeless tobacco. He reports current alcohol use. He reports that he does not use drugs.    Allergies  Allergen Reactions  . Terazosin Other (See Comments)    dizziness    Medications:  Prior to Admission medications   Medication Sig Start Date End Date Taking? Authorizing Provider  amLODipine (NORVASC) 10 MG tablet  03/23/19  Yes [provider]  amphetamine-dextroamphetamine (ADDERALL) 30 MG tablet Take 30 mg by mouth 2 (two) times daily. 04/21/17  Yes [provider]  escitalopram (LEXAPRO) 10 MG tablet Take 10 mg by mouth at bedtime. 03/07/19  Yes [provider]  lovastatin (MEVACOR) 20 MG tablet Take 40 mg by mouth daily.    Yes [provider]  mirabegron ER (MYRBETRIQ) 50 MG TB24 tablet Take 50 mg by mouth daily.   Yes [provider]  telmisartan-hydrochlorothiazide (MICARDIS HCT) 80-12.5 MG tablet Take 1 tablet by mouth daily. 01/19/18  Yes [provider]  traMADol (ULTRAM) 50 MG tablet Take 50 mg by mouth 3 (three) times daily as needed for moderate pain.   Yes [provider]    ROS:  Out of a complete 14 system review of symptoms, the patient complains only of the following symptoms, and all other reviewed systems are negative.  Walking problems Leg weakness  Blood pressure 137/86, pulse 73, temperature (!) 97.1 F (36.2 C), temperature source Temporal, height 6\' 4"  (1.93 m), weight 240 lb (108.9 kg).  Physical Exam  General: The patient is alert and cooperative at the time of the examination.  Skin: No significant peripheral edema is noted.   Neurologic Exam  Mental status: The patient is alert and oriented x 3 at the time of the examination. The patient has apparent normal recent and  remote memory, with an apparently normal attention span and concentration ability.   Cranial nerves: Facial symmetry is present. Speech is normal, no aphasia or dysarthria is noted. Extraocular movements are full. Visual fields are full.  Motor: The patient has good strength in all 4 extremities, with exception that the patient does have a foot drop on the left, the right foot has a healing boot on.  Sensory examination: Soft touch sensation is symmetric on the face, arms, and legs.  Coordination: The patient has good finger-nose-finger and heel-to-shin bilaterally.  Gait and station: The patient has a slightly wide-based gait, the patient walks with a cane.  He does have some intorsion of the right foot with walking.  Tandem  gait was not attempted.  Reflexes: Deep tendon reflexes are symmetric, but are depressed.   Assessment/Plan:  1.  History of closed head injury  2.  Mild left hemiparesis  3.  Peripheral neuropathy  4.  Gait disorder  5.  Left foot drop  The patient will be given an ankle support brace for the left to see if this improves his ability to walk more normally.  If not, an AFO brace can be used in the future.  They are to contact me about this.  Otherwise, the patient will follow-up through this office on an as-needed basis.  The patient will continue his physical therapy.  Greater than 50% of the visit was spent in counseling and coordination of care.  Face-to-face time with the patient was 25 minutes.   Jill Alexanders MD 03/29/2019 10:12 AM  [facility] [address] [place], [place] [address]  Phone [phone] Fax [phone]

## 2019-04-02 ENCOUNTER — Ambulatory Visit: Payer: Medicare Other | Admitting: Physical Therapy

## 2019-04-02 ENCOUNTER — Other Ambulatory Visit: Payer: Self-pay

## 2019-04-02 DIAGNOSIS — R2689 Other abnormalities of gait and mobility: Secondary | ICD-10-CM

## 2019-04-02 DIAGNOSIS — R2681 Unsteadiness on feet: Secondary | ICD-10-CM | POA: Diagnosis not present

## 2019-04-02 DIAGNOSIS — M6281 Muscle weakness (generalized): Secondary | ICD-10-CM

## 2019-04-03 DIAGNOSIS — M14671 Charcot's joint, right ankle and foot: Secondary | ICD-10-CM | POA: Diagnosis not present

## 2019-04-03 DIAGNOSIS — Z981 Arthrodesis status: Secondary | ICD-10-CM | POA: Diagnosis not present

## 2019-04-03 DIAGNOSIS — M85871 Other specified disorders of bone density and structure, right ankle and foot: Secondary | ICD-10-CM | POA: Diagnosis not present
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
## 2019-04-04 ENCOUNTER — Ambulatory Visit: Payer: Medicare Other | Admitting: Physical Therapy

## 2019-04-10 ENCOUNTER — Ambulatory Visit: Payer: Medicare Other | Admitting: Physical Therapy

## 2019-04-10 ENCOUNTER — Other Ambulatory Visit: Payer: Self-pay

## 2019-04-10 DIAGNOSIS — R2681 Unsteadiness on feet: Secondary | ICD-10-CM | POA: Diagnosis not present

## 2019-04-10 DIAGNOSIS — M6281 Muscle weakness (generalized): Secondary | ICD-10-CM | POA: Diagnosis not present

## 2019-04-10 DIAGNOSIS — R2689 Other abnormalities of gait and mobility: Secondary | ICD-10-CM | POA: Diagnosis not present

## 2019-04-10 NOTE — Patient Instructions (Signed)
Bracing With March in Bridging (Hook-Lying)    With neutral spine, tighten pelvic floor and abdominals and hold. Lift bottom and hold, then march in place. March _10__ times. Do _1__ times a day.    Bracing With Leg Extension in Bridging (Hook-Lying)    With neutral spine, tighten pelvic floor and abdominals and hold. Lift bottom and hold, straighten one leg, hips still. Reverse sequence. Repeat with other leg. Repeat _10__ times. Do _1__ times a day.    Bridging    Slowly raise buttocks from floor, keeping stomach tight. Repeat __10__ times per set. Do _1__ sets per session. Do _1___ sessions per day.     BRIDGING WITH KNEES APART/ BACK TOGETHER   Bridge up ; move knees apart; bring back together and hips go back down Do 10 times     HIP: Extension, Bridging Unilateral    Bend knee and place foot on surface. Push down through bent leg, tighten glutes. Raise hips up. _10__ reps per set, _1__ sets per day, _5__ days per week   Do each leg

## 2019-04-11 DIAGNOSIS — D225 Melanocytic nevi of trunk: Secondary | ICD-10-CM | POA: Diagnosis not present

## 2019-04-11 DIAGNOSIS — D1801 Hemangioma of skin and subcutaneous tissue: Secondary | ICD-10-CM | POA: Diagnosis not present

## 2019-04-11 DIAGNOSIS — L821 Other seborrheic keratosis: Secondary | ICD-10-CM | POA: Diagnosis not present

## 2019-04-11 DIAGNOSIS — L918 Other hypertrophic disorders of the skin: Secondary | ICD-10-CM | POA: Diagnosis not present
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
## 2019-04-12 ENCOUNTER — Ambulatory Visit: Payer: Medicare Other | Admitting: Physical Therapy

## 2019-04-23 ENCOUNTER — Ambulatory Visit: Payer: Medicare Other | Attending: Neurology | Admitting: Physical Therapy

## 2019-04-23 ENCOUNTER — Other Ambulatory Visit: Payer: Self-pay

## 2019-04-23 DIAGNOSIS — R2689 Other abnormalities of gait and mobility: Secondary | ICD-10-CM | POA: Diagnosis not present

## 2019-04-23 DIAGNOSIS — R2681 Unsteadiness on feet: Secondary | ICD-10-CM | POA: Insufficient documentation

## 2019-04-23 DIAGNOSIS — M6281 Muscle weakness (generalized): Secondary | ICD-10-CM | POA: Insufficient documentation

## 2019-04-23 NOTE — Patient Instructions (Signed)
Gaze Stabilization: Tip Card  1.Target must remain in focus, not blurry, and appear stationary while head is in motion. 2.Perform exercises with small head movements (45 to either side of midline). 3.Increase speed of head motion so long as target is in focus. 4.If you wear eyeglasses, be sure you can see target through lens (therapist will give specific instructions for bifocal / progressive lenses). 5.These exercises may provoke dizziness or nausea. Work through these symptoms. If too dizzy, slow head movement slightly. Rest between each exercise. 6.Exercises demand concentration; avoid distractions. 7.For safety, perform standing exercises close to a counter, wall, corner, or next to someone.     Gaze Stabilization: Standing Feet Apart    Feet shoulder width apart, keeping eyes on target on wall _6___ feet away, tilt head down 15-30 and move head side to side for _60___ seconds. Repeat while moving head up and down for _60___ seconds. Do __3-5__ sessions per day. Repeat using target on pattern background.  Copyright  VHI. All rights reserved.
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
## 2019-05-08 ENCOUNTER — Ambulatory Visit: Payer: Medicare Other | Admitting: Physical Therapy

## 2019-05-24 DIAGNOSIS — H2513 Age-related nuclear cataract, bilateral: Secondary | ICD-10-CM | POA: Diagnosis not present

## 2019-05-31 DIAGNOSIS — R3915 Urgency of urination: Secondary | ICD-10-CM | POA: Diagnosis not present

## 2019-05-31 DIAGNOSIS — N401 Enlarged prostate with lower urinary tract symptoms: Secondary | ICD-10-CM | POA: Diagnosis not present

## 2019-06-22 DIAGNOSIS — Z23 Encounter for immunization: Secondary | ICD-10-CM | POA: Diagnosis not present

## 2019-06-25 DIAGNOSIS — H524 Presbyopia: Secondary | ICD-10-CM | POA: Diagnosis not present

## 2019-06-25 DIAGNOSIS — H2513 Age-related nuclear cataract, bilateral: Secondary | ICD-10-CM | POA: Diagnosis not present

## 2019-06-26 ENCOUNTER — Telehealth: Payer: Self-pay | Admitting: *Deleted

## 2019-06-26 NOTE — Telephone Encounter (Signed)
Return call to patient went to voicemail message left for patient.

## 2019-06-27 NOTE — Telephone Encounter (Signed)
Pts wife called upset that her husband has not talked with anyone. Called and it went straight to VM> Left a message and also advised that Jacqlyn Larsen had as well. Told her that I would try calling again in the morning.   [place] Cerise, CMA

## 2019-06-28 ENCOUNTER — Telehealth: Payer: Self-pay | Admitting: *Deleted

## 2019-06-28 NOTE — Telephone Encounter (Signed)
Call from patient's wife c/o recurrent ulcers on lower leg that Dr. Donnetta Hutching and the wound care center have treated in the past. Wants areas checked "before they get real bad". I asked if they have approached PCP or wound care center.  regarding the new ulcer and she stated they  Were discharged from the wound care center. Understood they could follow-up here if any problems. Appt given to see first available NP on 10/21

## 2019-07-04 ENCOUNTER — Other Ambulatory Visit: Payer: Self-pay

## 2019-07-04 ENCOUNTER — Ambulatory Visit (INDEPENDENT_AMBULATORY_CARE_PROVIDER_SITE_OTHER): Payer: Medicare Other | Admitting: Family

## 2019-07-04 ENCOUNTER — Encounter: Payer: Self-pay | Admitting: Family

## 2019-07-04 VITALS — BP 133/76 | HR 73 | Resp 18 | Ht 76.0 in | Wt 245.0 lb

## 2019-07-04 DIAGNOSIS — I712 Thoracic aortic aneurysm, without rupture, unspecified: Secondary | ICD-10-CM

## 2019-07-04 DIAGNOSIS — L97312 Non-pressure chronic ulcer of right ankle with fat layer exposed: Secondary | ICD-10-CM

## 2019-07-04 DIAGNOSIS — I83013 Varicose veins of right lower extremity with ulcer of ankle: Secondary | ICD-10-CM
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
## 2019-07-04 NOTE — Patient Instructions (Signed)
To decrease swelling in your feet and legs: Elevate feet above slightly bent knees, feet above heart, overnight and 3-4 times per day for 20 minutes.    Venous Ulcer A venous ulcer is a shallow sore on your lower leg that is caused by poor circulation in your veins. This condition used to be called stasis ulcer. Venous ulcer is the most common type of lower leg ulcer. You may have venous ulcers on one leg or on both legs. The area where this condition most commonly develops is around the ankles. A venous ulcer may last for a long time (chronic ulcer) or it may return repeatedly (recurrent ulcer). What are the causes? A venous ulcer may be caused by any condition that causes poor blood flow in your legs. Veins have valves that help return blood to the heart. If these valves do not work properly:  Blood can flow backward and pool in the lower legs.  Blood can then leak out of your veins, which can irritate your skin.  Irritation can cause a break in the skin, which becomes a venous ulcer. What increases the risk? You are more likely to develop this condition if you:  Are 66 years of age or older.  Are male.  Are overweight.  Are not active.  Have had a leg ulcer in the past.  Have varicose veins.  Have clots in your lower leg veins (deep vein thrombosis).  Have inflammation of your leg veins (phlebitis).  Have recently had a pregnancy.  Use products that contain nicotine or tobacco. What are the signs or symptoms? The main symptom of this condition is an open sore near your ankle. Other symptoms may include:  Swelling.  Thickening of the skin.  Fluid leaking from the ulcer.  Bleeding.  Itching.  Pain and swelling that gets worse when you stand up and feels better when you raise your leg.  Blotchy skin.  Darkening of the skin. How is this diagnosed? Your health care provider may suspect a venous ulcer based on your medical history and your risk factors. He or  she may:  Do a physical exam.  Do other tests, such as: ? Measuring blood pressure in your arms and legs. ? Using sound waves (ultrasound) to measure blood flow in your leg veins. How is this treated? This condition may be treated by:  Keeping your leg raised (elevated).  Wearing a type of bandage or stocking to compress the veins of your leg (compression therapy).  Taking medicines to improve blood flow.  Taking antibiotic medicines to treat infection.  Cleaning your ulcer and removing any dead tissue from the wound (debridement).  Placing various types of medicated bandages (dressings) or medicated wraps on your ulcer.  Surgery to close the wound using a piece of skin taken from another area of your body (graft). This is only done for wounds that are deep or hard to heal. You may need to try several different types of treatment to get your venous ulcer to heal. Healing may take a long time. Follow these instructions at home: Medicines  Take or apply over-the-counter and prescription medicines only as told by your health care provider.  If you were prescribed an antibiotic medicine, take it as told by your health care provider. Do not stop using the antibiotic even if you start to feel better.  Ask your health care provider if you should take aspirin before long trips. Wound care  Follow instructions from your health care provider about  how to take care of your wound. Make sure you: ? Wash your hands with soap and water before and after you change your bandage (dressing). If soap and water are not available, use hand sanitizer. ? Change your dressing as told by your health care provider. ? If you had a skin graft, leave stitches (sutures) in place. These may need to stay in place for 2 weeks or longer. ? Ask when you should remove your dressing. If your dressing is dry and sticks to your leg when you try to remove it, moisten or wet the dressing with saline solution or water so  that the dressing can be removed without harming your skin or wound tissue.  When you are able to remove your dressing, check your wound every day for signs of infection. Have a caregiver do this for you if you are not able to do it yourself. Check for: ? More redness, swelling, or pain. ? More fluid or blood. ? Warmth. ? Pus or a bad smell. Activity  Avoid sitting for a long time without moving. Get up to take short walks every 1-2 hours. This is important to improve blood flow in your legs. Ask for help if you feel weak or unsteady.  Ask your health care provider what level of activity is safe for you.  Rest with your legs raised (elevated) during the day. If possible, elevate your legs above the level of your heart for 30 minutes, 3-4 times a day, or as told by your health care provider.  Do not sit with your legs crossed. General instructions   Wear elastic stockings, compression stockings, or support hose as told by your health care provider.  Raise the foot of your bed as told by your health care provider.  Do not use any products that contain nicotine or tobacco, such as cigarettes, e-cigarettes, and chewing tobacco. If you need help quitting, ask your health care provider.  Keep all follow-up visits as told by your health care provider. This is important. Contact a health care provider if:  Your ulcer is getting larger or is not healing.  Your pain gets worse. Get help right away if you have:  More redness, swelling, or pain around your ulcer.  More fluid or blood coming from your ulcer.  Warmth in the area around your ulcer.  Pus or a bad smell coming from your ulcer.  A fever. Summary  A venous ulcer is a shallow sore on your lower leg that is caused by poor circulation in your veins.  Follow instructions from your health care provider about how to take care of your wound.  Check your wound every day for signs of infection.  Take over-the-counter and  prescription medicines only as told by your health care provider.  Keep all follow-up visits as told by your health care provider. This is important. This information is not intended to replace advice given to you by your health care provider. Make sure you discuss any questions you have with your health care provider. Document Released: 05/25/2001 Document Revised: 04/27/2018 Document Reviewed: 04/27/2018 Elsevier Patient Education  2020 [employer].

## 2019-07-11 ENCOUNTER — Ambulatory Visit (INDEPENDENT_AMBULATORY_CARE_PROVIDER_SITE_OTHER): Payer: Medicare Other | Admitting: Family

## 2019-07-11 ENCOUNTER — Other Ambulatory Visit: Payer: Self-pay

## 2019-07-11 DIAGNOSIS — L97312 Non-pressure chronic ulcer of right ankle with fat layer exposed: Secondary | ICD-10-CM

## 2019-07-11 DIAGNOSIS — I712 Thoracic aortic aneurysm, without rupture, unspecified: Secondary | ICD-10-CM

## 2019-07-11 DIAGNOSIS — I83013 Varicose veins of right lower extremity with ulcer of ankle: Secondary | ICD-10-CM | POA: Diagnosis not present

## 2019-07-11 NOTE — Progress Notes (Signed)
  Pt and wife instructed at their visit on 07-04-19 to contact [employer] to modify the right boot prosthesis to prevent friction at his right ankle.   Unna boot medicated compression dressing applied to right ankle, right foot, and right lower leg by L. Cooper LPN who states the wound appears improved; this is the 2nd weekly dressing change.   Return in 1 week to replace unna boot dressing right ankle/foot/lower leg. Return weekly for 4 weeks total for unna boot change; if not healed by then, will refer to wound care center. Pt has been to the wound care center for a venous stasis ulcer on his left lower leg which has healed.   Return in February 2022 for CT chest follow up of thoracic aneurysm stent graft repair, see Dr. Donnetta Hutching.

## 2019-07-12 DIAGNOSIS — H25811 Combined forms of age-related cataract, right eye: Secondary | ICD-10-CM | POA: Diagnosis not present

## 2019-07-12 DIAGNOSIS — H2511 Age-related nuclear cataract, right eye: Secondary | ICD-10-CM | POA: Diagnosis not present

## 2019-07-19 ENCOUNTER — Other Ambulatory Visit: Payer: Self-pay

## 2019-07-19 ENCOUNTER — Encounter: Payer: Self-pay | Admitting: Family

## 2019-07-19 ENCOUNTER — Ambulatory Visit (INDEPENDENT_AMBULATORY_CARE_PROVIDER_SITE_OTHER): Payer: Medicare Other | Admitting: Family

## 2019-07-19 VITALS — BP 120/77 | HR 74 | Resp 20 | Ht 72.0 in | Wt 245.0 lb

## 2019-07-19 DIAGNOSIS — I83013 Varicose veins of right lower extremity with ulcer of ankle: Secondary | ICD-10-CM

## 2019-07-19 DIAGNOSIS — L97312 Non-pressure chronic ulcer of right ankle with fat layer exposed: Secondary | ICD-10-CM | POA: Diagnosis not present

## 2019-07-19 DIAGNOSIS — I712 Thoracic aortic aneurysm, without rupture, unspecified: Secondary | ICD-10-CM

## 2019-07-19 NOTE — Progress Notes (Signed)
Right ankle abrasion ulcer has complexly healed, no need for another unna boot dressing; pt to wear compression hose daily, advised to see prosthetic flitter to adjust right charcot boot to not abrade right ankle.   Return in February 2022 for CT chest follow up of thoracic aneurysm stent graft repair, see Dr. Donnetta Hutching.

## 2019-07-20 ENCOUNTER — Encounter: Payer: Medicare Other | Admitting: Family

## 2019-07-24 ENCOUNTER — Other Ambulatory Visit: Payer: Self-pay

## 2019-07-24 DIAGNOSIS — Z20822 Contact with and (suspected) exposure to covid-19: Secondary | ICD-10-CM

## 2019-07-26 LAB — NOVEL CORONAVIRUS, NAA: SARS-CoV-2, NAA: NOT DETECTED

## 2019-07-30 DIAGNOSIS — Z125 Encounter for screening for malignant neoplasm of prostate: Secondary | ICD-10-CM | POA: Diagnosis not present

## 2019-07-30 DIAGNOSIS — E7849 Other hyperlipidemia: Secondary | ICD-10-CM | POA: Diagnosis not present

## 2019-08-06 DIAGNOSIS — R2689 Other abnormalities of gait and mobility: Secondary | ICD-10-CM | POA: Diagnosis not present

## 2019-08-06 DIAGNOSIS — F330 Major depressive disorder, recurrent, mild: Secondary | ICD-10-CM | POA: Diagnosis not present

## 2019-08-06 DIAGNOSIS — I878 Other specified disorders of veins: Secondary | ICD-10-CM | POA: Diagnosis not present

## 2019-08-06 DIAGNOSIS — G894 Chronic pain syndrome: Secondary | ICD-10-CM | POA: Diagnosis not present

## 2019-08-06 DIAGNOSIS — M14679 Charcot's joint, unspecified ankle and foot: Secondary | ICD-10-CM | POA: Diagnosis not present

## 2019-08-06 DIAGNOSIS — I129 Hypertensive chronic kidney disease with stage 1 through stage 4 chronic kidney disease, or unspecified chronic kidney disease: Secondary | ICD-10-CM | POA: Diagnosis not present

## 2019-08-06 DIAGNOSIS — N1831 Chronic kidney disease, stage 3a: Secondary | ICD-10-CM | POA: Diagnosis not present

## 2019-08-06 DIAGNOSIS — N3281 Overactive bladder: Secondary | ICD-10-CM | POA: Diagnosis not present

## 2019-08-06 DIAGNOSIS — I714 Abdominal aortic aneurysm, without rupture: Secondary | ICD-10-CM | POA: Diagnosis not present

## 2019-08-06 DIAGNOSIS — Z0000 Encounter for general adult medical examination without abnormal findings: Secondary | ICD-10-CM | POA: Diagnosis not present

## 2019-08-06 DIAGNOSIS — Z1331 Encounter for screening for depression: Secondary | ICD-10-CM | POA: Diagnosis not present

## 2019-08-06 DIAGNOSIS — E785 Hyperlipidemia, unspecified: Secondary | ICD-10-CM | POA: Diagnosis not present

## 2019-08-16 DIAGNOSIS — H2181 Floppy iris syndrome: Secondary | ICD-10-CM | POA: Diagnosis not present

## 2019-08-16 DIAGNOSIS — H2512 Age-related nuclear cataract, left eye: Secondary | ICD-10-CM | POA: Diagnosis not present

## 2019-08-29 ENCOUNTER — Other Ambulatory Visit: Payer: Medicare Other

## 2019-09-10 ENCOUNTER — Other Ambulatory Visit: Payer: Medicare Other

## 2019-10-02 DIAGNOSIS — J40 Bronchitis, not specified as acute or chronic: Secondary | ICD-10-CM | POA: Diagnosis not present

## 2019-10-02 DIAGNOSIS — R05 Cough: Secondary | ICD-10-CM | POA: Diagnosis not present

## 2019-10-02 DIAGNOSIS — Z1152 Encounter for screening for COVID-19: Secondary | ICD-10-CM | POA: Diagnosis not present

## 2019-10-05 ENCOUNTER — Other Ambulatory Visit: Payer: Self-pay

## 2019-10-05 DIAGNOSIS — I712 Thoracic aortic aneurysm, without rupture, unspecified: Secondary | ICD-10-CM

## 2019-10-09 DIAGNOSIS — M7731 Calcaneal spur, right foot: Secondary | ICD-10-CM | POA: Diagnosis not present

## 2019-10-09 DIAGNOSIS — M14671 Charcot's joint, right ankle and foot: Secondary | ICD-10-CM | POA: Diagnosis not present

## 2019-10-09 DIAGNOSIS — M85871 Other specified disorders of bone density and structure, right ankle and foot: Secondary | ICD-10-CM | POA: Diagnosis not present

## 2019-10-16 ENCOUNTER — Ambulatory Visit: Payer: Medicare Other | Admitting: Vascular Surgery

## 2019-10-25 ENCOUNTER — Other Ambulatory Visit: Payer: Medicare Other

## 2019-10-30 ENCOUNTER — Ambulatory Visit: Payer: Medicare Other | Admitting: Vascular Surgery

## 2019-11-06 DIAGNOSIS — I129 Hypertensive chronic kidney disease with stage 1 through stage 4 chronic kidney disease, or unspecified chronic kidney disease: Secondary | ICD-10-CM | POA: Diagnosis not present

## 2019-11-09 DIAGNOSIS — E785 Hyperlipidemia, unspecified: Secondary | ICD-10-CM | POA: Diagnosis not present

## 2019-11-09 DIAGNOSIS — N1831 Chronic kidney disease, stage 3a: Secondary | ICD-10-CM | POA: Diagnosis not present

## 2019-11-09 DIAGNOSIS — I129 Hypertensive chronic kidney disease with stage 1 through stage 4 chronic kidney disease, or unspecified chronic kidney disease: Secondary | ICD-10-CM | POA: Diagnosis not present

## 2019-11-09 DIAGNOSIS — G894 Chronic pain syndrome: Secondary | ICD-10-CM | POA: Diagnosis not present

## 2019-11-09 DIAGNOSIS — F330 Major depressive disorder, recurrent, mild: Secondary | ICD-10-CM | POA: Diagnosis not present

## 2019-11-21 ENCOUNTER — Encounter: Payer: Self-pay | Admitting: Internal Medicine

## 2019-12-12 ENCOUNTER — Other Ambulatory Visit: Payer: Self-pay

## 2019-12-12 ENCOUNTER — Ambulatory Visit (AMBULATORY_SURGERY_CENTER): Payer: Self-pay | Admitting: *Deleted

## 2019-12-12 VITALS — Temp 97.3°F | Ht 76.0 in | Wt 250.0 lb

## 2019-12-12 DIAGNOSIS — Z86010 Personal history of colonic polyps: Secondary | ICD-10-CM

## 2019-12-12 DIAGNOSIS — Z800 Family history of malignant neoplasm of digestive organs: Secondary | ICD-10-CM

## 2019-12-12 MED ORDER — PLENVU 140 G PO SOLR: 1.0000 | ORAL | 0 refills | Status: DC

## 2019-12-12 NOTE — Progress Notes (Signed)
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 Please only bring one care partner. Please be aware that your care partner may wait in the car in the parking lot or if they feel like they will be too hot to wait in the car, they may wait in the lobby on the 4th floor. All care partners are required to wear a mask the entire time (we do not have any that we can provide them), they need to practice social distancing, and we will do a Covid check for all patient's and care partners when you arrive. Also we will check their temperature and your temperature. If the care partner waits in their car they need to stay in the parking lot the entire time and we will call them on their cell phone when the patient is ready for discharge so they can bring the car to the front of the building. Also all patient's will need to wear a mask into building.   Plenvu sample-  LOT [id number]  EXP 12/2019

## 2019-12-24 DIAGNOSIS — I129 Hypertensive chronic kidney disease with stage 1 through stage 4 chronic kidney disease, or unspecified chronic kidney disease: Secondary | ICD-10-CM | POA: Diagnosis not present

## 2019-12-24 DIAGNOSIS — N1831 Chronic kidney disease, stage 3a: Secondary | ICD-10-CM | POA: Diagnosis not present

## 2019-12-24 DIAGNOSIS — F330 Major depressive disorder, recurrent, mild: Secondary | ICD-10-CM | POA: Diagnosis not present

## 2019-12-26 ENCOUNTER — Ambulatory Visit (AMBULATORY_SURGERY_CENTER): Payer: Medicare Other | Admitting: Internal Medicine

## 2019-12-26 ENCOUNTER — Other Ambulatory Visit: Payer: Self-pay

## 2019-12-26 ENCOUNTER — Encounter: Payer: Self-pay | Admitting: Internal Medicine

## 2019-12-26 VITALS — BP 100/66 | HR 60 | Temp 96.8°F | Resp 11 | Ht 76.0 in | Wt 250.0 lb

## 2019-12-26 DIAGNOSIS — Z86010 Personal history of colonic polyps: Secondary | ICD-10-CM

## 2019-12-26 DIAGNOSIS — D125 Benign neoplasm of sigmoid colon: Secondary | ICD-10-CM

## 2019-12-26 DIAGNOSIS — D128 Benign neoplasm of rectum: Secondary | ICD-10-CM | POA: Diagnosis not present

## 2019-12-26 DIAGNOSIS — D123 Benign neoplasm of transverse colon: Secondary | ICD-10-CM | POA: Diagnosis not present

## 2019-12-26 DIAGNOSIS — D124 Benign neoplasm of descending colon: Secondary | ICD-10-CM

## 2019-12-26 DIAGNOSIS — K635 Polyp of colon: Secondary | ICD-10-CM | POA: Diagnosis not present

## 2019-12-26 DIAGNOSIS — Z800 Family history of malignant neoplasm of digestive organs: Secondary | ICD-10-CM

## 2019-12-26 DIAGNOSIS — K5730 Diverticulosis of large intestine without perforation or abscess without bleeding: Secondary | ICD-10-CM | POA: Diagnosis not present

## 2019-12-26 MED ORDER — SODIUM CHLORIDE 0.9 % IV SOLN: 500.0000 mL | Freq: Once | INTRAVENOUS | Status: DC

## 2019-12-26 NOTE — Patient Instructions (Addendum)
I found and removed 11 small polyps. I will let you know pathology results and when to have another routine colonoscopy by mail and/or My Chart.  I appreciate the opportunity to care for you. Gatha Mayer, MD, [employer]  Please read handouts provided. Continue present medications. Await pathology results.     YOU HAD AN ENDOSCOPIC PROCEDURE TODAY AT [facility]:   Refer to the procedure report that was given to you for any specific questions about what was found during the examination.  If the procedure report does not answer your questions, please call your gastroenterologist to clarify.  If you requested that your care partner not be given the details of your procedure findings, then the procedure report has been included in a sealed envelope for you to review at your convenience later.  YOU SHOULD EXPECT: Some feelings of bloating in the abdomen. Passage of more gas than usual.  Walking can help get rid of the air that was put into your GI tract during the procedure and reduce the bloating. If you had a lower endoscopy (such as a colonoscopy or flexible sigmoidoscopy) you may notice spotting of blood in your stool or on the toilet paper. If you underwent a bowel prep for your procedure, you may not have a normal bowel movement for a few days.  Please Note:  You might notice some irritation and congestion in your nose or some drainage.  This is from the oxygen used during your procedure.  There is no need for concern and it should clear up in a day or so.  SYMPTOMS TO REPORT IMMEDIATELY:   Following lower endoscopy (colonoscopy or flexible sigmoidoscopy):  Excessive amounts of blood in the stool  Significant tenderness or worsening of abdominal pains  Swelling of the abdomen that is new, acute  Fever of 100F or higher    For urgent or emergent issues, a gastroenterologist can be reached at any hour by calling [phone]. Do not use MyChart messaging for urgent  concerns.    DIET:  We do recommend a small meal at first, but then you may proceed to your regular diet.  Drink plenty of fluids but you should avoid alcoholic beverages for 24 hours.  ACTIVITY:  You should plan to take it easy for the rest of today and you should NOT DRIVE or use heavy machinery until tomorrow (because of the sedation medicines used during the test).    FOLLOW UP: Our staff will call the number listed on your records 48-72 hours following your procedure to check on you and address any questions or concerns that you may have regarding the information given to you following your procedure. If we do not reach you, we will leave a message.  We will attempt to reach you two times.  During this call, we will ask if you have developed any symptoms of COVID 19. If you develop any symptoms (ie: fever, flu-like symptoms, shortness of breath, cough etc.) before then, please call [phone].  If you test positive for Covid 19 in the 2 weeks post procedure, please call and report this information to [place].    If any biopsies were taken you will be contacted by phone or by letter within the next 1-3 weeks.  Please call us at [phone] if you have not heard about the biopsies in 3 weeks.    SIGNATURES/CONFIDENTIALITY: You and/or your care partner have signed paperwork which will be entered into your electronic medical record.  These signatures attest to the fact that that the information above on your After Visit Summary has been reviewed and is understood.  Full responsibility of the confidentiality of this discharge information lies with you and/or your care-partner.

## 2019-12-26 NOTE — Progress Notes (Signed)
To PACU, VSS. Report to [email or web address] 

## 2019-12-26 NOTE — Op Note (Signed)
[place] Patient Name: Trevor [name] Procedure Date: 12/26/2019 9:41 AM MRN: [id number] Endoscopist: Gatha Mayer , MD Age: 69 Referring MD:  Date of Birth: [birth year] Gender: Male Account #: [id number] Procedure:                Colonoscopy Indications:              High risk colon cancer surveillance: Personal                            history of colonic polyps Medicines:                Propofol per Anesthesia, Monitored Anesthesia Care Procedure:                Pre-Anesthesia Assessment:                           - Prior to the procedure, a History and Physical                            was performed, and patient medications and                            allergies were reviewed. The patient's tolerance of                            previous anesthesia was also reviewed. The risks                            and benefits of the procedure and the sedation                            options and risks were discussed with the patient.                            All questions were answered, and informed consent                            was obtained. Prior Anticoagulants: The patient has                            taken no previous anticoagulant or antiplatelet                            agents. ASA Grade Assessment: III - A patient with                            severe systemic disease. After reviewing the risks                            and benefits, the patient was deemed in                            satisfactory condition to undergo the procedure.  After obtaining informed consent, the colonoscope                            was passed under direct vision. Throughout the                            procedure, the patient's blood pressure, pulse, and                            oxygen saturations were monitored continuously. The                            Colonoscope was introduced through the anus and                            advanced to the  the cecum, identified by                            appendiceal orifice and ileocecal valve. The                            colonoscopy was performed without difficulty. The                            patient tolerated the procedure well. The quality                            of the bowel preparation was excellent. The bowel                            preparation used was Plenvu via split dose                            instruction. The ileocecal valve, appendiceal                            orifice, and rectum were photographed. Scope In: 10:14:50 AM Scope Out: 10:36:05 AM Scope Withdrawal Time: 0 hours 18 minutes 7 seconds  Total Procedure Duration: 0 hours 21 minutes 15 seconds  Findings:                 The perianal and digital rectal examinations were                            normal.                           Eleven (11) sessile polyps were found in the                            rectum, sigmoid colon, descending colon and                            transverse colon. The polyps were diminutive in  size. These polyps were removed with a cold snare.                            Resection and retrieval were complete. Verification                            of patient identification for the specimen was                            done. Estimated blood loss was minimal.                           Multiple diverticula were found in the sigmoid                            colon.                           The exam was otherwise without abnormality on                            direct and retroflexion views. Complications:            No immediate complications. Estimated Blood Loss:     Estimated blood loss was minimal. Impression:               - Eleven (11) diminutive polyps in the rectum, in                            the sigmoid colon, in the descending colon and in                            the transverse colon, removed with a cold snare.                             Resected and retrieved.                           - Diverticulosis in the sigmoid colon.                           - The examination was otherwise normal on direct                            and retroflexion views.                           - Personal history of colonic polyps.                           - 3 10 mm hyperplastic polyps removed from right                            and left colon 2004  4 polyps, one 10 mm others diminutive and                            hyperplastic 2007                           Followed closer than routine risk due to multiple                            large hyperplastic polyps                           5 adenomas with one of them serrated                           05/29/2015 11 polyps max 10 mm adenomas, ssa's,                            hyperplastic -                           09/21/2017 3 diminutive polyps removed adenomas Recommendation:           - Patient has a contact number available for                            emergencies. The signs and symptoms of potential                            delayed complications were discussed with the                            patient. Return to normal activities tomorrow.                            Written discharge instructions were provided to the                            patient.                           - Resume previous diet.                           - Continue present medications.                           - Repeat colonoscopy is recommended for                            surveillance. The colonoscopy date will be                            determined after pathology results from today's                            exam become available for review.  Gatha Mayer, MD 12/26/2019 10:50:15 AM This report has been signed electronically.

## 2019-12-26 NOTE — Progress Notes (Signed)
Pt's states no medical or surgical changes since previsit or office visit.   Temp-LC  V/S-CW

## 2019-12-26 NOTE — Progress Notes (Signed)
Called to room to assist during endoscopic procedure.  Patient ID and intended procedure confirmed with present staff. Received instructions for my participation in the procedure from the performing physician.  

## 2019-12-28 ENCOUNTER — Telehealth: Payer: Self-pay | Admitting: *Deleted

## 2019-12-28 NOTE — Telephone Encounter (Signed)
  Follow up Call-  Call back number 12/26/2019 09/21/2017  Post procedure Call Back phone  # [phone]  Permission to leave phone message Yes Yes  Some recent data might be hidden     Patient questions:  Do you have a fever, pain , or abdominal swelling? No. Pain Score  0 *  Have you tolerated food without any problems? Yes.    Have you been able to return to your normal activities? Yes.    Do you have any questions about your discharge instructions: Diet   No. Medications  No. Follow up visit  No.  Do you have questions or concerns about your Care? No.  Actions: * If pain score is 4 or above: No action needed, pain <4  1. Have you developed a fever since your procedure? NO  2.   Have you had an respiratory symptoms (SOB or cough) since your procedure? NO  3.   Have you tested positive for COVID 19 since your procedure NO  4.   Have you had any family members/close contacts diagnosed with the COVID 19 since your procedure?  NO   If yes to any of these questions please route to Joylene John, RN and Erenest Rasher, RN

## 2019-12-31 ENCOUNTER — Encounter: Payer: Self-pay | Admitting: Internal Medicine

## 2020-01-04 DIAGNOSIS — F330 Major depressive disorder, recurrent, mild: Secondary | ICD-10-CM | POA: Diagnosis not present

## 2020-01-04 DIAGNOSIS — R062 Wheezing: Secondary | ICD-10-CM | POA: Diagnosis not present

## 2020-01-14 IMAGING — CR DG CHEST 2V
2 series · 2 of 2 positions shown · non-contrast
Comparison: Chest x-ray dated June 08, 2017.

CLINICAL DATA: Shortness of breath and cough.

EXAM:
CHEST - 2 VIEW

[chest lat]
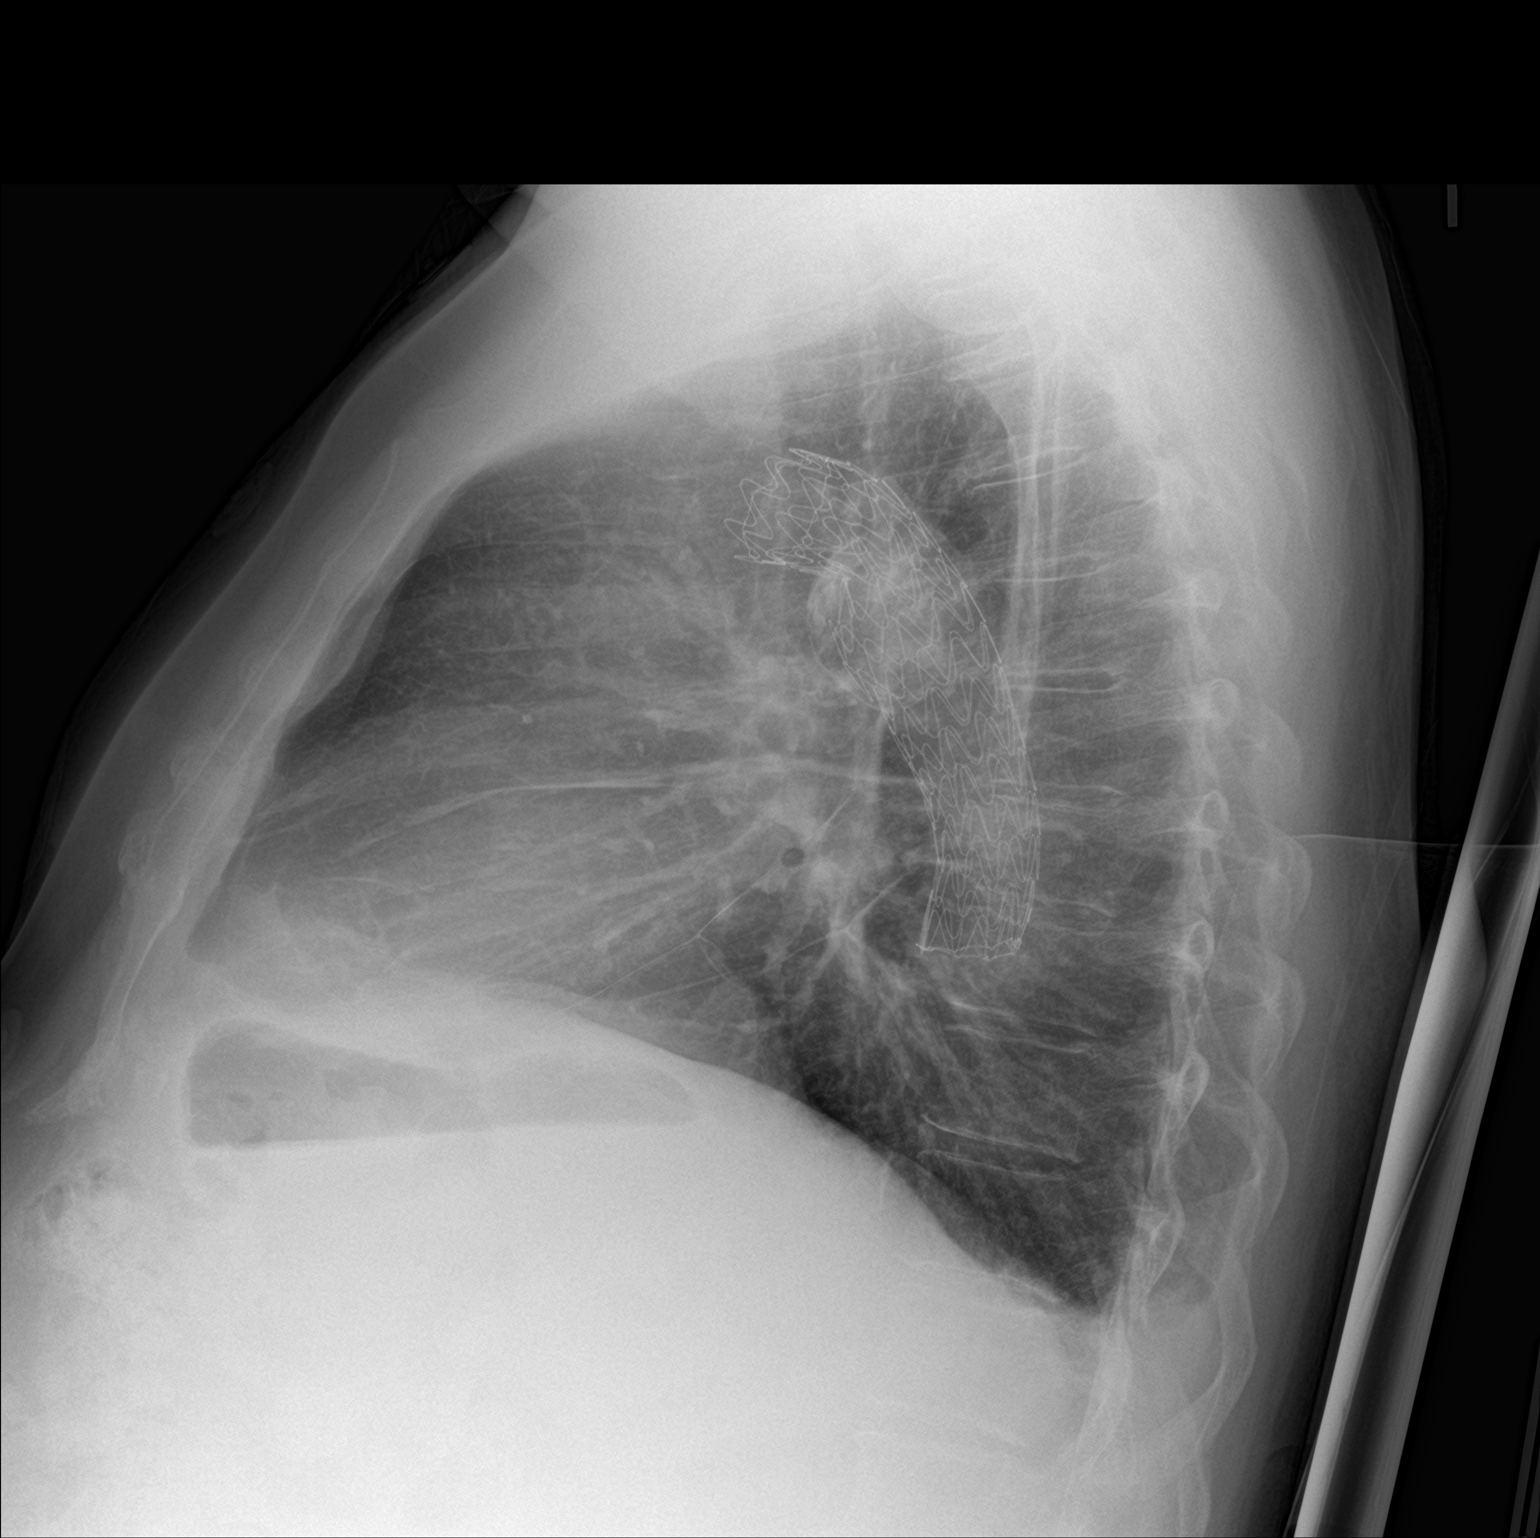

[chest ap]
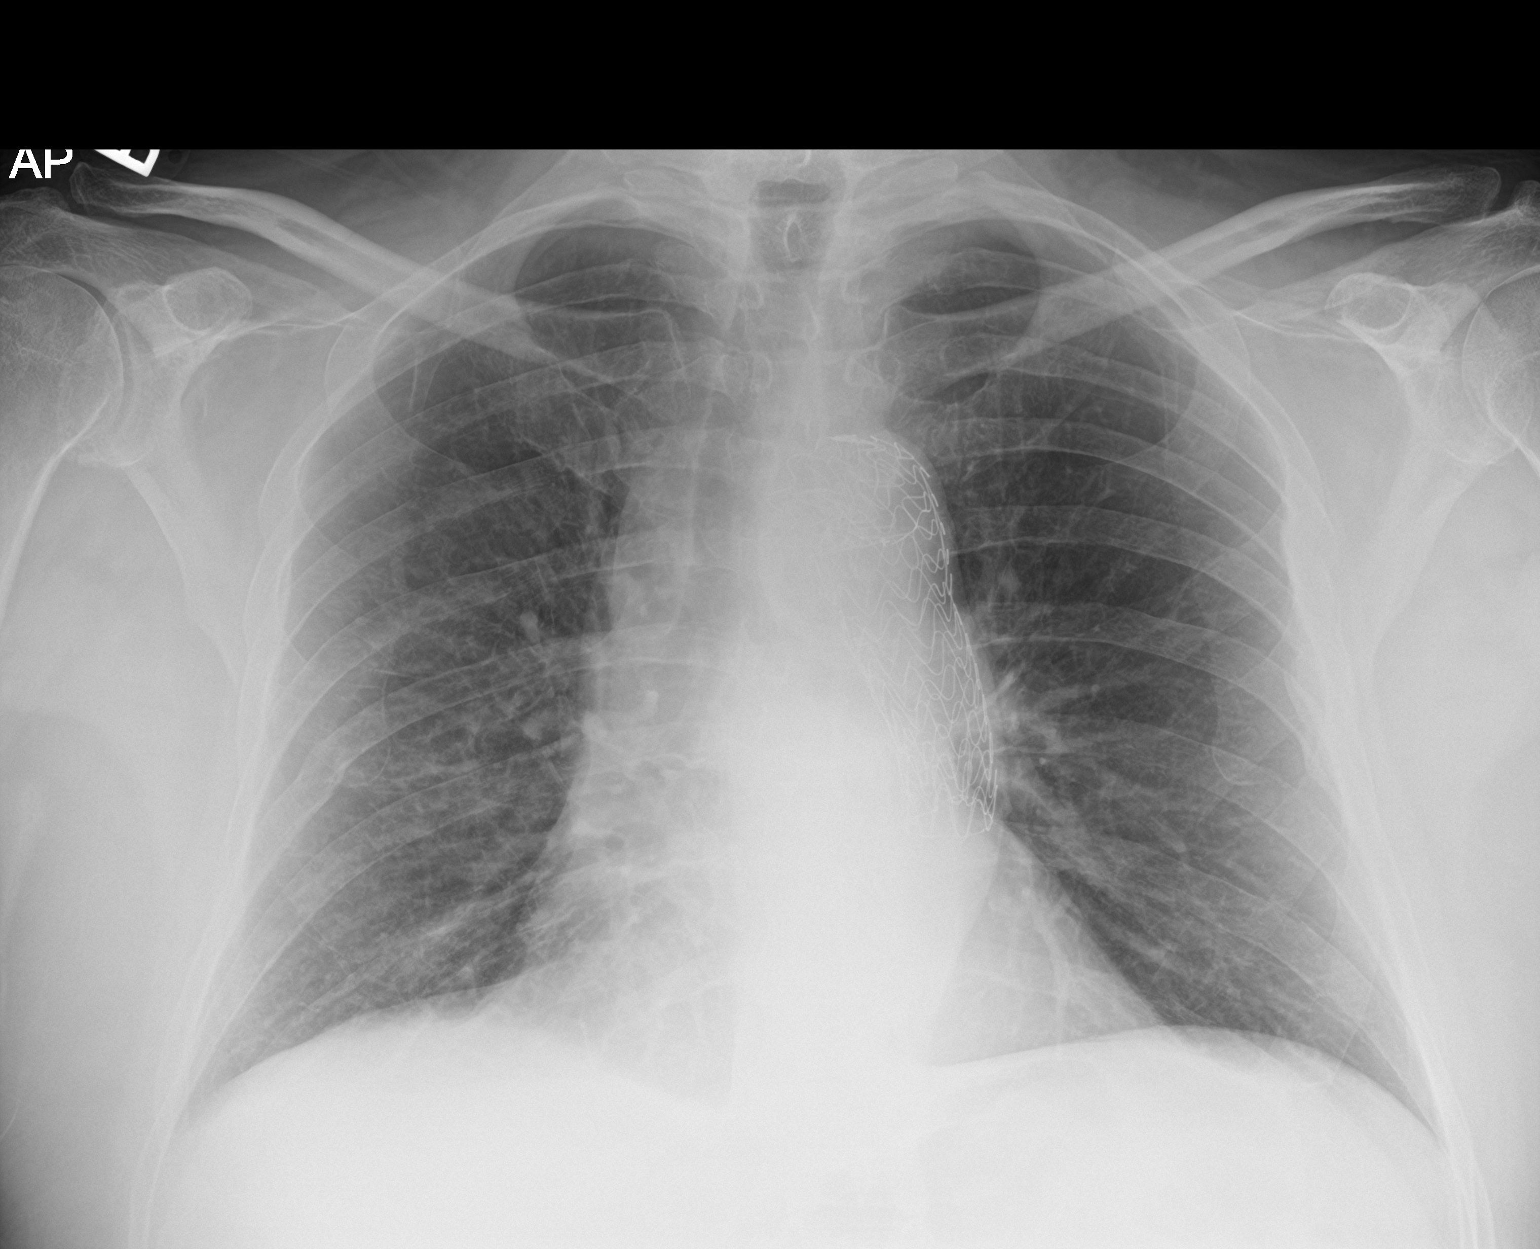

[2 of 2 positions shown; findings below may reference images not displayed]

FINDINGS: The heart size and mediastinal contours are within normal limits.
Prior thoracic stent graft placement. Normal pulmonary vascularity.
No focal consolidation, pleural effusion, or pneumothorax. No acute
osseous abnormality. Old rib fractures.
IMPRESSION: No active cardiopulmonary disease.

## 2020-02-19 DIAGNOSIS — R3915 Urgency of urination: Secondary | ICD-10-CM | POA: Diagnosis not present

## 2020-02-26 DIAGNOSIS — R3915 Urgency of urination: Secondary | ICD-10-CM | POA: Diagnosis not present

## 2020-03-28 DIAGNOSIS — R3915 Urgency of urination: Secondary | ICD-10-CM | POA: Diagnosis not present

## 2020-04-08 DIAGNOSIS — M19071 Primary osteoarthritis, right ankle and foot: Secondary | ICD-10-CM | POA: Diagnosis not present

## 2020-04-08 DIAGNOSIS — Z981 Arthrodesis status: Secondary | ICD-10-CM | POA: Diagnosis not present

## 2020-04-08 DIAGNOSIS — M7730 Calcaneal spur, unspecified foot: Secondary | ICD-10-CM | POA: Diagnosis not present

## 2020-04-08 DIAGNOSIS — M14671 Charcot's joint, right ankle and foot: Secondary | ICD-10-CM | POA: Diagnosis not present

## 2020-04-10 DIAGNOSIS — R3915 Urgency of urination: Secondary | ICD-10-CM | POA: Diagnosis not present

## 2020-04-15 DIAGNOSIS — R3915 Urgency of urination: Secondary | ICD-10-CM | POA: Diagnosis not present

## 2020-05-06 DIAGNOSIS — R3915 Urgency of urination: Secondary | ICD-10-CM | POA: Diagnosis not present

## 2020-05-20 DIAGNOSIS — R3915 Urgency of urination: Secondary | ICD-10-CM | POA: Diagnosis not present

## 2020-05-27 DIAGNOSIS — R3915 Urgency of urination: Secondary | ICD-10-CM | POA: Diagnosis not present

## 2020-06-17 DIAGNOSIS — R3915 Urgency of urination: Secondary | ICD-10-CM | POA: Diagnosis not present

## 2020-06-30 DIAGNOSIS — M19012 Primary osteoarthritis, left shoulder: Secondary | ICD-10-CM | POA: Diagnosis not present

## 2020-07-02 ENCOUNTER — Other Ambulatory Visit: Payer: Self-pay | Admitting: Orthopedic Surgery

## 2020-07-02 DIAGNOSIS — Z23 Encounter for immunization: Secondary | ICD-10-CM | POA: Diagnosis not present

## 2020-07-02 DIAGNOSIS — M25512 Pain in left shoulder: Secondary | ICD-10-CM

## 2020-07-14 ENCOUNTER — Ambulatory Visit: Payer: Medicare Other | Admitting: Physical Therapy

## 2020-07-27 ENCOUNTER — Ambulatory Visit
Admission: RE | Admit: 2020-07-27 | Discharge: 2020-07-27 | Disposition: A | Discharge status: Home / Self Care | Payer: Medicare Other | Source: Ambulatory Visit | Attending: Orthopedic Surgery | Admitting: Orthopedic Surgery

## 2020-07-27 DIAGNOSIS — M25512 Pain in left shoulder: Secondary | ICD-10-CM

## 2020-07-27 DIAGNOSIS — S46012A Strain of muscle(s) and tendon(s) of the rotator cuff of left shoulder, initial encounter: Secondary | ICD-10-CM | POA: Diagnosis not present

## 2020-07-28 ENCOUNTER — Ambulatory Visit: Payer: Medicare Other | Admitting: Physical Therapy

## 2020-07-29 ENCOUNTER — Other Ambulatory Visit: Payer: Self-pay

## 2020-07-29 ENCOUNTER — Ambulatory Visit: Payer: Medicare Other | Attending: Surgical | Admitting: Physical Therapy

## 2020-07-29 ENCOUNTER — Ambulatory Visit: Payer: Medicare Other | Admitting: Physical Therapy

## 2020-07-29 DIAGNOSIS — M6281 Muscle weakness (generalized): Secondary | ICD-10-CM | POA: Diagnosis not present

## 2020-07-29 DIAGNOSIS — R2681 Unsteadiness on feet: Secondary | ICD-10-CM | POA: Diagnosis not present

## 2020-07-29 DIAGNOSIS — R2689 Other abnormalities of gait and mobility: Secondary | ICD-10-CM | POA: Diagnosis not present

## 2020-07-30 ENCOUNTER — Encounter: Payer: Self-pay | Admitting: Physical Therapy
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
## 2020-07-31 ENCOUNTER — Other Ambulatory Visit: Payer: Self-pay

## 2020-07-31 ENCOUNTER — Ambulatory Visit: Payer: Medicare Other | Admitting: Physical Therapy

## 2020-07-31 DIAGNOSIS — R2689 Other abnormalities of gait and mobility: Secondary | ICD-10-CM

## 2020-07-31 DIAGNOSIS — R2681 Unsteadiness on feet: Secondary | ICD-10-CM | POA: Diagnosis not present

## 2020-07-31 DIAGNOSIS — M6281 Muscle weakness (generalized): Secondary | ICD-10-CM | POA: Diagnosis not present

## 2020-07-31 NOTE — Patient Instructions (Signed)
Gastroc / Heel Cord Stretch - On Step    Stand with heels over edge of stair. Holding rail, lower heels until stretch is felt in calf of legs.  Right heel off edge of step - hold 20-30 secs. Repeat _1-2__ times. Do _1-2__ times per day.      Stretching: Gastroc (Right leg back)    Stand with right foot back, leg straight, forward leg bent. Keeping heel on floor, turned slightly out, lean into wall until stretch is felt in calf. Hold _20-30___ seconds. Repeat _1-2___ times per set. Do __1__ sets per session. Do __1-2__ sessions per day.  http://orth.exer.us/662   Copyright  VHI. All rights reserved.

## 2020-08-01 ENCOUNTER — Encounter: Payer: Self-pay | Admitting: Physical Therapy
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
## 2020-08-04 DIAGNOSIS — E785 Hyperlipidemia, unspecified: Secondary | ICD-10-CM | POA: Diagnosis not present

## 2020-08-04 DIAGNOSIS — Z125 Encounter for screening for malignant neoplasm of prostate: Secondary | ICD-10-CM | POA: Diagnosis not present

## 2020-08-11 DIAGNOSIS — N1831 Chronic kidney disease, stage 3a: Secondary | ICD-10-CM | POA: Diagnosis not present

## 2020-08-11 DIAGNOSIS — Z0000 Encounter for general adult medical examination without abnormal findings: Secondary | ICD-10-CM | POA: Diagnosis not present

## 2020-08-11 DIAGNOSIS — N3281 Overactive bladder: Secondary | ICD-10-CM | POA: Diagnosis not present

## 2020-08-11 DIAGNOSIS — M14679 Charcot's joint, unspecified ankle and foot: Secondary | ICD-10-CM | POA: Diagnosis not present

## 2020-08-11 DIAGNOSIS — E785 Hyperlipidemia, unspecified: Secondary | ICD-10-CM | POA: Diagnosis not present

## 2020-08-11 DIAGNOSIS — F330 Major depressive disorder, recurrent, mild: Secondary | ICD-10-CM | POA: Diagnosis not present

## 2020-08-11 DIAGNOSIS — R82998 Other abnormal findings in urine: Secondary | ICD-10-CM | POA: Diagnosis not present

## 2020-08-11 DIAGNOSIS — I10 Essential (primary) hypertension: Secondary | ICD-10-CM | POA: Diagnosis not present

## 2020-08-11 DIAGNOSIS — G894 Chronic pain syndrome: Secondary | ICD-10-CM | POA: Diagnosis not present

## 2020-08-11 DIAGNOSIS — I129 Hypertensive chronic kidney disease with stage 1 through stage 4 chronic kidney disease, or unspecified chronic kidney disease: Secondary | ICD-10-CM | POA: Diagnosis not present

## 2020-08-14 ENCOUNTER — Ambulatory Visit: Payer: Medicare Other | Attending: Surgical | Admitting: Physical Therapy

## 2020-08-14 ENCOUNTER — Other Ambulatory Visit: Payer: Self-pay

## 2020-08-14 DIAGNOSIS — R2681 Unsteadiness on feet: Secondary | ICD-10-CM | POA: Insufficient documentation

## 2020-08-14 DIAGNOSIS — R2689 Other abnormalities of gait and mobility: Secondary | ICD-10-CM | POA: Insufficient documentation

## 2020-08-14 DIAGNOSIS — M6281 Muscle weakness (generalized): Secondary | ICD-10-CM | POA: Diagnosis not present

## 2020-08-15 ENCOUNTER — Encounter: Payer: Self-pay | Admitting: Physical Therapy
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
## 2020-08-18 ENCOUNTER — Ambulatory Visit: Payer: Medicare Other | Admitting: Physical Therapy

## 2020-08-18 ENCOUNTER — Other Ambulatory Visit: Payer: Self-pay

## 2020-08-18 DIAGNOSIS — R2681 Unsteadiness on feet: Secondary | ICD-10-CM | POA: Diagnosis not present

## 2020-08-18 DIAGNOSIS — R2689 Other abnormalities of gait and mobility: Secondary | ICD-10-CM | POA: Diagnosis not present

## 2020-08-18 DIAGNOSIS — M6281 Muscle weakness (generalized): Secondary | ICD-10-CM

## 2020-08-19 DIAGNOSIS — M14671 Charcot's joint, right ankle and foot: Secondary | ICD-10-CM | POA: Diagnosis not present

## 2020-08-19 DIAGNOSIS — M85871 Other specified disorders of bone density and structure, right ankle and foot: Secondary | ICD-10-CM | POA: Diagnosis not present

## 2020-08-19 NOTE — Therapy (Signed)
[facility] [address] [place], [place], [address] Phone: [phone]   Fax:  [phone]  Physical Therapy Treatment  Patient Details  Name: Trevor [name] MRN: [id number] Date of Birth: [birth year] Referring Provider (PT): Fayette Pho, [place]   Encounter Date: 08/18/2020   PT End of Session - 08/19/20 1956    Visit Number 4    Number of Visits 9    Date for PT Re-Evaluation 08/29/20    Authorization Type Medicare/BCBS    Authorization Time Period 07-29-20 - 09-12-20    PT Start Time 1020    PT Stop Time 1104    PT Time Calculation (min) 44 min    Activity Tolerance Patient tolerated treatment well    Behavior During Therapy [facility] for tasks assessed/performed           Past Medical History:  Diagnosis Date  . Acute pain of left shoulder 04/04/2017  . ADD (attention deficit disorder)   . Allergy   . Anxiety   . Arthritis    right foot  . Arthritis of midfoot 04/29/2016   Overview:  Added automatically from request for surgery [id number]  . Blood transfusion without reported diagnosis   . Cataract    removed both eyes with lens implants   . Chest wall pain 09/29/2017  . Chronic pain of right ankle 04/08/2016  . Cough due to ACE inhibitor 03/18/2017  . Depression   . Descending thoracic aortic aneurysm ([place]) 04/04/2017   Overview:  4.5 cm on CT scan of April 04, 2017.  . Diverticulosis of colon 11/25/2015  . Dyspnea on exertion 04/04/2017  . Essential hypertension 11/25/2015  . Executive function deficit 12/31/2015  . Family history of colon cancer in mother    dx'd age late 31's  . Foot pain, right 04/08/2016  . Gait abnormality 03/29/2019  . H/O blood clots   . Health maintenance examination 09/29/2017  . History of colonic polyps 02/04/2011   3 10 mm hyperplastic polyps removed from right and left colon 2004 4 polyps, one 10 mm others diminutive and hyperplastic 2007 Followed closer than routine risk due to multiple large  hyperplastic polyps 5 adenomas with one of them serrated  05/29/2015 11 polyps max 10 mm adenomas, ssa's, hyperplastic - 09/21/2017 3 diminutive polyps removed adenomas - recall 2021 (2 yrs)     . Hypercholesterolemia 11/25/2015  . Hyperlipidemia   . Hypertension   . Left foot pain 01/27/2016  . Nocturia more than twice per night 12/31/2015  . Pain in joints 12/31/2015  . Pectoralis major tendinitis, right 03/18/2017  . Pericarditis   . Peripheral neuropathy 04/24/2018  . Pneumonia   . Primary osteoarthritis of right foot 11/14/2015  . Shortness of breath 09/29/2017  . Status post right foot surgery 06/23/2016  . Strain of right pectoralis muscle 09/07/2016  . Thoracic aortic aneurysm ([place]) 06/08/2017  . Traumatic brain injury ([place])    1976  . Ulcer   . Varicose veins   . Varicose veins of lower extremities with ulcer ([place]) 11/16/2012    Past Surgical History:  Procedure Laterality Date  . BURR HOLE OF CRANIUM  04/10/75  . COLONOSCOPY    . COLONOSCOPY W/ POLYPECTOMY  2004, 2007, 02/03/11   multiple large hyperplastic polyps 04/07, adenomas and one serrated 2012  . ENDOVENOUS ABLATION SAPHENOUS VEIN W/ LASER Left 09-02-2010   EVLA left great and small saphenous vein by Curt Jews MD   . FOOT FUSION Right  [place]  . neck fusion    . POLYPECTOMY    . ROTATOR CUFF REPAIR Right 2019  . rt. knee replacement    . THORACIC AORTIC ENDOVASCULAR STENT GRAFT N/A 06/08/2017   Procedure: THORACIC AORTIC ENDOVASCULAR STENT GRAFT;  Surgeon: Rosetta Posner, MD;  Location: [facility] OR;  Service: Vascular;  Laterality: N/A;    There were no vitals filed for this visit.                      [facility] Adult PT Treatment/Exercise - 08/19/20 0001      Ambulation/Gait   Ambulation/Gait Yes    Ambulation/Gait Assistance 5: Supervision    Ambulation/Gait Assistance Details New balance shoes; shoe lift placed on Rt shoe (by [employer])    Ambulation Distance (Feet) 230 Feet    Assistive  device None    Gait Pattern Step-through pattern    Ambulation Surface Level;Indoor    Stairs Yes    Stairs Assistance 5: Supervision    Stair Management Technique Two rails;Step to pattern;Forwards    Number of Stairs 4    Height of Stairs 6    Gait Comments pt gait trained with air cast on LLE for incr. ankle stability - 115' with SBA at end of session          [employer];  Step ups RLE 10 reps with UE support on rails (light) with CGA     Balance Exercises - 08/19/20 0001      Balance Exercises: Standing   Rockerboard Anterior/posterior;EO;Other reps (comment)   3 sets 10 reps with less UE support   Gait with Head Turns 1 rep;Forward   30'   Other Standing Exercises stepping over and back of balance beam 10 reps each leg with UE support prn    Other Standing Exercises Comments tap ups alternating 10 reps each foot to 1st step with CGA                  PT Long Term Goals - 08/19/20 2004      PT LONG TERM GOAL #1   Title Increase Berg score by at least 5 points for reduced fall risk.    Baseline 39/56 on 07-31-20    Time 4    Period Weeks    Status New      PT LONG TERM GOAL #2   Title Improve TUG score to </= 23 secs to decrease fall risk.    Baseline 29.85 secs with no device    Time 4    Period Weeks    Status New      PT LONG TERM GOAL #3   Title Independent in HEP for balance and Rt ankle strengthening.    Time 4    Period Weeks    Status New      PT LONG TERM GOAL #4   Title Increase gait speed to >/= 2.4 ft/sec without device  for incr. gait efficiency.    Baseline 16.53 secs without device    Time 4    Period Weeks    Status New                  Patient will benefit from skilled therapeutic intervention in order to improve the following deficits and impairments:  Abnormal gait, Decreased activity tolerance, Decreased balance, Decreased coordination, Decreased strength, Impaired sensation, Postural dysfunction  Visit Diagnosis: Other  abnormalities of gait and mobility  Muscle weakness (generalized)  Unsteadiness on feet     Problem List Patient Active Problem List   Diagnosis Date Noted  . Gait abnormality 03/29/2019  . Peripheral neuropathy 04/24/2018  . Chest wall pain 09/29/2017  . Shortness of breath 09/29/2017  . Health maintenance examination 09/29/2017  . Thoracic aortic aneurysm ([place]) 06/08/2017  . Acute pain of left shoulder 04/04/2017  . Descending thoracic aortic aneurysm ([place]) 04/04/2017  . Dyspnea on exertion 04/04/2017  . Cough due to ACE inhibitor 03/18/2017  . Pectoralis major tendinitis, right 03/18/2017  . Strain of right pectoralis muscle 09/07/2016  . Status post right foot surgery 06/23/2016  . Arthritis of midfoot 04/29/2016  . Chronic pain of right ankle 04/08/2016  . Foot pain, right 04/08/2016  . Left foot pain 01/27/2016  . Executive function deficit 12/31/2015  . Nocturia more than twice per night 12/31/2015  . Pain in joints 12/31/2015  . ADD (attention deficit disorder) 11/25/2015  . Depression 11/25/2015  . Diverticulosis of colon 11/25/2015  . Essential hypertension 11/25/2015  . Hypercholesterolemia 11/25/2015  . Primary osteoarthritis of right foot 11/14/2015  . Family history of colon cancer in elderly mother 05/29/2015  . Varicose veins of lower extremities with ulcer ([place]) 11/16/2012  . History of colonic polyps 02/04/2011    Alda Lea, PT 08/19/2020, 8:07 PM  [facility] [address] [place], [place], [address] Phone: [phone]   Fax:  [phone]  Name: SHAQUEL CHAVOUS MRN: [id number] Date of Birth: 1950/10/30

## 2020-08-21 ENCOUNTER — Ambulatory Visit: Payer: Medicare Other | Admitting: Physical Therapy

## 2020-08-21 ENCOUNTER — Other Ambulatory Visit: Payer: Self-pay

## 2020-08-21 DIAGNOSIS — R2689 Other abnormalities of gait and mobility: Secondary | ICD-10-CM

## 2020-08-21 DIAGNOSIS — R2681 Unsteadiness on feet: Secondary | ICD-10-CM | POA: Diagnosis not present

## 2020-08-21 DIAGNOSIS — M6281 Muscle weakness (generalized): Secondary | ICD-10-CM

## 2020-08-22 ENCOUNTER — Encounter: Payer: Self-pay | Admitting: Physical Therapy

## 2020-08-22 NOTE — Therapy (Signed)
[facility] [address] [place], [place], [address] Phone: [phone]   Fax:  [phone]  Physical Therapy Treatment  Patient Details  Name: Trevor [name] MRN: [id number] Date of Birth: [birth year] Referring Provider (PT): Fayette Pho, [place]   Encounter Date: 08/21/2020   PT End of Session - 08/22/20 1021    Visit Number 5    Number of Visits 9    Date for PT Re-Evaluation 08/29/20    Authorization Type Medicare/BCBS    Authorization Time Period 07-29-20 - 09-12-20    PT Start Time 1534    PT Stop Time 1620    PT Time Calculation (min) 46 min    Equipment Utilized During Treatment Gait belt    Activity Tolerance Patient tolerated treatment well    Behavior During Therapy [facility] for tasks assessed/performed           Past Medical History:  Diagnosis Date  . Acute pain of left shoulder 04/04/2017  . ADD (attention deficit disorder)   . Allergy   . Anxiety   . Arthritis    right foot  . Arthritis of midfoot 04/29/2016   Overview:  Added automatically from request for surgery [id number]  . Blood transfusion without reported diagnosis   . Cataract    removed both eyes with lens implants   . Chest wall pain 09/29/2017  . Chronic pain of right ankle 04/08/2016  . Cough due to ACE inhibitor 03/18/2017  . Depression   . Descending thoracic aortic aneurysm ([place]) 04/04/2017   Overview:  4.5 cm on CT scan of April 04, 2017.  . Diverticulosis of colon 11/25/2015  . Dyspnea on exertion 04/04/2017  . Essential hypertension 11/25/2015  . Executive function deficit 12/31/2015  . Family history of colon cancer in mother    dx'd age late 41's  . Foot pain, right 04/08/2016  . Gait abnormality 03/29/2019  . H/O blood clots   . Health maintenance examination 09/29/2017  . History of colonic polyps 02/04/2011   3 10 mm hyperplastic polyps removed from right and left colon 2004 4 polyps, one 10 mm others diminutive and hyperplastic 2007  Followed closer than routine risk due to multiple large hyperplastic polyps 5 adenomas with one of them serrated  05/29/2015 11 polyps max 10 mm adenomas, ssa's, hyperplastic - 09/21/2017 3 diminutive polyps removed adenomas - recall 2021 (2 yrs)     . Hypercholesterolemia 11/25/2015  . Hyperlipidemia   . Hypertension   . Left foot pain 01/27/2016  . Nocturia more than twice per night 12/31/2015  . Pain in joints 12/31/2015  . Pectoralis major tendinitis, right 03/18/2017  . Pericarditis   . Peripheral neuropathy 04/24/2018  . Pneumonia   . Primary osteoarthritis of right foot 11/14/2015  . Shortness of breath 09/29/2017  . Status post right foot surgery 06/23/2016  . Strain of right pectoralis muscle 09/07/2016  . Thoracic aortic aneurysm ([place]) 06/08/2017  . Traumatic brain injury ([place])    1976  . Ulcer   . Varicose veins   . Varicose veins of lower extremities with ulcer ([place]) 11/16/2012    Past Surgical History:  Procedure Laterality Date  . BURR HOLE OF CRANIUM  04/10/75  . COLONOSCOPY    . COLONOSCOPY W/ POLYPECTOMY  2004, 2007, 02/03/11   multiple large hyperplastic polyps 04/07, adenomas and one serrated 2012  . ENDOVENOUS ABLATION SAPHENOUS VEIN W/ LASER Left 09-02-2010   EVLA left great and small saphenous vein by Sherren Mocha  Early MD   . FOOT FUSION Right    [place]  . neck fusion    . POLYPECTOMY    . ROTATOR CUFF REPAIR Right 2019  . rt. knee replacement    . THORACIC AORTIC ENDOVASCULAR STENT GRAFT N/A 06/08/2017   Procedure: THORACIC AORTIC ENDOVASCULAR STENT GRAFT;  Surgeon: Rosetta Posner, MD;  [facility];  Service: Vascular;  Laterality: N/A;    There were no vitals filed for this visit.   Subjective Assessment - 08/22/20 1009    Subjective Pt reports he received air cast for LLE and is wearing it now - states it feels good - "I don't have to worry about how my left foot is landing"; pt states air cast was ordered from [employer]    Patient Stated Goals Improve Rt  ankle strength and ROM and improve balance    Currently in Pain? No/denies                             [facility] Adult PT Treatment/Exercise - 08/22/20 0001      Transfers   Transfers Sit to Stand;Stand to Sit    Number of Reps Other reps (comment)   5   Comments from high/low mat table - pt needs at least 1 UE support for transfer      Ambulation/Gait   Ambulation/Gait Yes    Ambulation/Gait Assistance 5: Supervision    Ambulation/Gait Assistance Details pt wearing New Balance tennis shoe with shoe lift on RLE and wearing air cast on LLE for Lt ankle stability; pt used SPC for stability and safety    Ambulation Distance (Feet) 400 Feet    Assistive device Straight cane    Gait Pattern Within Functional Limits    Ambulation Surface Level;Unlevel;Indoor;Outdoor;Paved;Grass    Gait Comments Pt did not use SPC for assistance with ambulation in clinic - only used with amb. outdoors      Knee/Hip Exercises: Standing   Heel Raises Both;2 sets;10 reps    Forward Step Up Right;1 set;10 reps               Balance Exercises - 08/22/20 0001      Balance Exercises: Standing   Rockerboard Anterior/posterior;EO;Other reps (comment)   3 sets 10 reps with less UE support   Marching Solid surface;Static;10 reps    Other Standing Exercises pt performed cone taps to 3 cones - 5 reps with each foot    Other Standing Exercises Comments tap ups alternating 10 reps each foot to 1st step with CGA; to 2nd step with UE support on rail prn with CGA                  PT Long Term Goals - 08/22/20 1031      PT LONG TERM GOAL #1   Title Increase Berg score by at least 5 points for reduced fall risk.    Baseline 39/56 on 07-31-20    Time 4    Period Weeks    Status New      PT LONG TERM GOAL #2   Title Improve TUG score to </= 23 secs to decrease fall risk.    Baseline 29.85 secs with no device    Time 4    Period Weeks    Status New      PT LONG TERM GOAL #3   Title  Independent in HEP for balance and Rt ankle strengthening.  Time 4    Period Weeks    Status New      PT LONG TERM GOAL #4   Title Increase gait speed to >/= 2.4 ft/sec without device  for incr. gait efficiency.    Baseline 16.53 secs without device    Time 4    Period Weeks    Status New                 Plan - 08/22/20 1022    Clinical Impression Statement Skilled PT session focused on gait training on various surfaces with pt using SPC with outdoor ambulation including paved and grassy surfaces.  Pt's gait deviations are significantly reduced with use of shoe lift on Rt shoe to accomodate for leg length discrepancy and use of air cast on LLE to reduce Lt ankle supination in swing phase, thereby, increasing safety by maintaining Lt ankle and foot in neutral position in stance phase of gait.  Pt does need SPC for safety with amb. on uneven surfaces due to decreased AROM in Rt ankle due to Charcot's joint of Rt foot with limited flexibility.  Cont with POC.    Personal Factors and Comorbidities Past/Current Experience;Comorbidity 2;Transportation;Time since onset of injury/illness/exacerbation    Examination-Activity Limitations Bend;Carry;Lift;Stairs;Transfers;Locomotion Level;Squat    Examination-Participation Restrictions Cleaning;Community Activity;Driving;Interpersonal Relationship;Shop    Stability/Clinical Decision Making Evolving/Moderate complexity    Rehab Potential Good    PT Frequency 2x / week    PT Duration 4 weeks    PT Treatment/Interventions ADLs/Self Care Home Management;Gait training;Stair training;Therapeutic activities;Therapeutic exercise;Balance training;Neuromuscular re-education;Patient/family education;DME Instruction    PT Next Visit Plan Rt ankle strengthening; balance exercises    Consulted and Agree with Plan of Care Patient           Patient will benefit from skilled therapeutic intervention in order to improve the following deficits and  impairments:  Abnormal gait,Decreased activity tolerance,Decreased balance,Decreased coordination,Decreased strength,Impaired sensation,Postural dysfunction  Visit Diagnosis: Other abnormalities of gait and mobility  Muscle weakness (generalized)  Unsteadiness on feet     Problem List Patient Active Problem List   Diagnosis Date Noted  . Gait abnormality 03/29/2019  . Peripheral neuropathy 04/24/2018  . Chest wall pain 09/29/2017  . Shortness of breath 09/29/2017  . Health maintenance examination 09/29/2017  . Thoracic aortic aneurysm ([place]) 06/08/2017  . Acute pain of left shoulder 04/04/2017  . Descending thoracic aortic aneurysm ([place]) 04/04/2017  . Dyspnea on exertion 04/04/2017  . Cough due to ACE inhibitor 03/18/2017  . Pectoralis major tendinitis, right 03/18/2017  . Strain of right pectoralis muscle 09/07/2016  . Status post right foot surgery 06/23/2016  . Arthritis of midfoot 04/29/2016  . Chronic pain of right ankle 04/08/2016  . Foot pain, right 04/08/2016  . Left foot pain 01/27/2016  . Executive function deficit 12/31/2015  . Nocturia more than twice per night 12/31/2015  . Pain in joints 12/31/2015  . ADD (attention deficit disorder) 11/25/2015  . Depression 11/25/2015  . Diverticulosis of colon 11/25/2015  . Essential hypertension 11/25/2015  . Hypercholesterolemia 11/25/2015  . Primary osteoarthritis of right foot 11/14/2015  . Family history of colon cancer in elderly mother 05/29/2015  . Varicose veins of lower extremities with ulcer ([place]) 11/16/2012  . History of colonic polyps 02/04/2011    Alda Lea, PT 08/22/2020, 10:33 AM  [facility] [address] [place], [place], [address] Phone: [phone]   Fax:  [phone]  Name: OLNEY MONIER MRN: [id number]  Date of Birth: 21-Apr-1951

## 2020-08-26 ENCOUNTER — Ambulatory Visit: Payer: Medicare Other | Admitting: Physical Therapy

## 2020-08-26 ENCOUNTER — Other Ambulatory Visit: Payer: Self-pay

## 2020-08-26 DIAGNOSIS — M6281 Muscle weakness (generalized): Secondary | ICD-10-CM

## 2020-08-26 DIAGNOSIS — R2689 Other abnormalities of gait and mobility: Secondary | ICD-10-CM

## 2020-08-26 DIAGNOSIS — R2681 Unsteadiness on feet: Secondary | ICD-10-CM

## 2020-08-26 NOTE — Patient Instructions (Signed)
  Strengthening: Hip Extension - Resisted    With tubing around right ankle, face anchor and pull leg straight back. Repeat _15___ times per set. Do __2__ sets per session. Do __1__ sessions per day.  http://orth.exer.us/636   Copyright  VHI. All rights reserved.    Functional Quadriceps: Sit to Stand    Sit on edge of chair, feet flat on floor. Stand upright, extending knees fully. Repeat __10__ times per set. Do _1-2___ sets per session. Do _1___ sessions per day.  http://orth.exer.us/734   Copyright  VHI. All rights reserved.

## 2020-08-27 ENCOUNTER — Encounter: Payer: Self-pay | Admitting: Physical Therapy

## 2020-08-27 NOTE — Therapy (Signed)
[facility] [address] [place], [place], [address] Phone: [phone]   Fax:  [phone]  Physical Therapy Treatment  Patient Details  Name: Trevor [name] MRN: [id number] Date of Birth: 1951/01/29 Referring Provider (PT): Fayette Pho, [place]   Encounter Date: 08/26/2020   PT End of Session - 08/27/20 1808    Visit Number 6    Number of Visits 9    Date for PT Re-Evaluation 08/29/20    Authorization Type Medicare/BCBS    Authorization Time Period 07-29-20 - 09-12-20    PT Start Time 1533    PT Stop Time 1628    PT Time Calculation (min) 55 min    Equipment Utilized During Treatment Gait belt    Activity Tolerance Patient tolerated treatment well    Behavior During Therapy [facility] for tasks assessed/performed           Past Medical History:  Diagnosis Date  . Acute pain of left shoulder 04/04/2017  . ADD (attention deficit disorder)   . Allergy   . Anxiety   . Arthritis    right foot  . Arthritis of midfoot 04/29/2016   Overview:  Added automatically from request for surgery [id number]  . Blood transfusion without reported diagnosis   . Cataract    removed both eyes with lens implants   . Chest wall pain 09/29/2017  . Chronic pain of right ankle 04/08/2016  . Cough due to ACE inhibitor 03/18/2017  . Depression   . Descending thoracic aortic aneurysm ([place]) 04/04/2017   Overview:  4.5 cm on CT scan of April 04, 2017.  . Diverticulosis of colon 11/25/2015  . Dyspnea on exertion 04/04/2017  . Essential hypertension 11/25/2015  . Executive function deficit 12/31/2015  . Family history of colon cancer in mother    dx'd age late 88's  . Foot pain, right 04/08/2016  . Gait abnormality 03/29/2019  . H/O blood clots   . Health maintenance examination 09/29/2017  . History of colonic polyps 02/04/2011   3 10 mm hyperplastic polyps removed from right and left colon 2004 4 polyps, one 10 mm others diminutive and hyperplastic 2007  Followed closer than routine risk due to multiple large hyperplastic polyps 5 adenomas with one of them serrated  05/29/2015 11 polyps max 10 mm adenomas, ssa's, hyperplastic - 09/21/2017 3 diminutive polyps removed adenomas - recall 2021 (2 yrs)     . Hypercholesterolemia 11/25/2015  . Hyperlipidemia   . Hypertension   . Left foot pain 01/27/2016  . Nocturia more than twice per night 12/31/2015  . Pain in joints 12/31/2015  . Pectoralis major tendinitis, right 03/18/2017  . Pericarditis   . Peripheral neuropathy 04/24/2018  . Pneumonia   . Primary osteoarthritis of right foot 11/14/2015  . Shortness of breath 09/29/2017  . Status post right foot surgery 06/23/2016  . Strain of right pectoralis muscle 09/07/2016  . Thoracic aortic aneurysm ([place]) 06/08/2017  . Traumatic brain injury ([place])    1976  . Ulcer   . Varicose veins   . Varicose veins of lower extremities with ulcer ([place]) 11/16/2012    Past Surgical History:  Procedure Laterality Date  . BURR HOLE OF CRANIUM  04/10/75  . COLONOSCOPY    . COLONOSCOPY W/ POLYPECTOMY  2004, 2007, 02/03/11   multiple large hyperplastic polyps 04/07, adenomas and one serrated 2012  . ENDOVENOUS ABLATION SAPHENOUS VEIN W/ LASER Left 09-02-2010   EVLA left great and small saphenous vein by Sherren Mocha  Early MD   . FOOT FUSION Right    [place]  . neck fusion    . POLYPECTOMY    . ROTATOR CUFF REPAIR Right 2019  . rt. knee replacement    . THORACIC AORTIC ENDOVASCULAR STENT GRAFT N/A 06/08/2017   Procedure: THORACIC AORTIC ENDOVASCULAR STENT GRAFT;  Surgeon: Rosetta Posner, MD;  Location: [facility] OR;  Service: Vascular;  Laterality: N/A;    There were no vitals filed for this visit.   Subjective Assessment - 08/26/20 1540    Subjective Pt states he has been trying to push himself to do more exercises    Patient Stated Goals Improve Rt ankle strength and ROM and improve balance    Currently in Pain? No/denies                              [facility] Adult PT Treatment/Exercise - 08/27/20 0001      Transfers   Transfers Sit to Stand;Stand to Sit    Number of Reps Other reps (comment)   5 reps   Comments from high/low mat table - pt needs at least 1 UE support for transfer      Ambulation/Gait   Ambulation/Gait Yes    Ambulation/Gait Assistance 5: Supervision    Ambulation/Gait Assistance Details shoe lift on Rt shoe    Ambulation Distance (Feet) 230 Feet    Assistive device None    Gait Pattern Within Functional Limits    Ambulation Surface Level;Indoor      Exercises   Exercises Knee/Hip      Knee/Hip Exercises: Standing   Heel Raises Both;2 sets;10 reps    Hip Extension Stengthening;Both;1 set;10 reps;Knee straight   with red theraband   Forward Step Up Right;1 set;10 reps               Balance Exercises - 08/27/20 0001      Balance Exercises: Standing   Rockerboard Anterior/posterior;EO;Other reps (comment)   3 sets 10 reps with less UE support   Gait with Head Turns 1 rep;Forward   30'   Marching Solid surface;Static;10 reps    Other Standing Exercises pt performed cone taps to 3 cones - 5 reps with each foot    Other Standing Exercises Comments tap ups alternating 10 reps each foot to 1st step with CGA; to 2nd step with UE support on rail prn with CGA             PT Education - 08/27/20 1807    Education Details sit to stand and hip extension with red theraband added to HEP    Person(s) Educated Patient    Methods Explanation;Demonstration;Handout    Comprehension Verbalized understanding;Returned demonstration               PT Long Term Goals - 08/27/20 1813      PT LONG TERM GOAL #1   Title Increase Berg score by at least 5 points for reduced fall risk.    Baseline 39/56 on 07-31-20    Time 4    Period Weeks    Status New      PT LONG TERM GOAL #2   Title Improve TUG score to </= 23 secs to decrease fall risk.    Baseline 29.85 secs with no  device    Time 4    Period Weeks    Status New      PT LONG TERM GOAL #3  Title Independent in HEP for balance and Rt ankle strengthening.    Time 4    Period Weeks    Status New      PT LONG TERM GOAL #4   Title Increase gait speed to >/= 2.4 ft/sec without device  for incr. gait efficiency.    Baseline 16.53 secs without device    Time 4    Period Weeks    Status New                 Plan - 08/27/20 1808    Clinical Impression Statement Session focused on balance training on compliant and noncompliant surfaces with CGA for assist with recovery of LOB.  Wife attended last part of session and requested that PT work on pt improving sit to stand transfer, picking up object off floor more easily and floor to stand transfer. Pt able to pick up object off floor with wide BOS and moving slowly down toward floor and also with return to upright.  Pt unable to transfer sit to stand from mat table without UE support due to hip extensor weakness.  Cont with POC.    Personal Factors and Comorbidities Past/Current Experience;Comorbidity 2;Transportation;Time since onset of injury/illness/exacerbation    Examination-Activity Limitations Bend;Carry;Lift;Stairs;Transfers;Locomotion Level;Squat    Examination-Participation Restrictions Cleaning;Community Activity;Driving;Interpersonal Relationship;Shop    Stability/Clinical Decision Making Evolving/Moderate complexity    Rehab Potential Good    PT Frequency 2x / week    PT Duration 4 weeks    PT Treatment/Interventions ADLs/Self Care Home Management;Gait training;Stair training;Therapeutic activities;Therapeutic exercise;Balance training;Neuromuscular re-education;Patient/family education;DME Instruction    PT Next Visit Plan Check LTG's next session; Rt ankle strengthening; balance exercises    Consulted and Agree with Plan of Care Patient           Patient will benefit from skilled therapeutic intervention in order to improve the  following deficits and impairments:  Abnormal gait,Decreased activity tolerance,Decreased balance,Decreased coordination,Decreased strength,Impaired sensation,Postural dysfunction  Visit Diagnosis: Other abnormalities of gait and mobility  Muscle weakness (generalized)  Unsteadiness on feet     Problem List Patient Active Problem List   Diagnosis Date Noted  . Gait abnormality 03/29/2019  . Peripheral neuropathy 04/24/2018  . Chest wall pain 09/29/2017  . Shortness of breath 09/29/2017  . Health maintenance examination 09/29/2017  . Thoracic aortic aneurysm ([place]) 06/08/2017  . Acute pain of left shoulder 04/04/2017  . Descending thoracic aortic aneurysm ([place]) 04/04/2017  . Dyspnea on exertion 04/04/2017  . Cough due to ACE inhibitor 03/18/2017  . Pectoralis major tendinitis, right 03/18/2017  . Strain of right pectoralis muscle 09/07/2016  . Status post right foot surgery 06/23/2016  . Arthritis of midfoot 04/29/2016  . Chronic pain of right ankle 04/08/2016  . Foot pain, right 04/08/2016  . Left foot pain 01/27/2016  . Executive function deficit 12/31/2015  . Nocturia more than twice per night 12/31/2015  . Pain in joints 12/31/2015  . ADD (attention deficit disorder) 11/25/2015  . Depression 11/25/2015  . Diverticulosis of colon 11/25/2015  . Essential hypertension 11/25/2015  . Hypercholesterolemia 11/25/2015  . Primary osteoarthritis of right foot 11/14/2015  . Family history of colon cancer in elderly mother 05/29/2015  . Varicose veins of lower extremities with ulcer ([place]) 11/16/2012  . History of colonic polyps 02/04/2011    Alda Lea, PT 08/27/2020, 6:15 PM  [facility] [address] [place], [place], [address] Phone: [phone]   Fax:  [phone]  Name: Eland  HASSAN BLACKSHIRE MRN: [id number] Date of Birth: Jan 13, 1951

## 2020-09-01 ENCOUNTER — Ambulatory Visit: Payer: Medicare Other | Admitting: Physical Therapy

## 2020-09-02 ENCOUNTER — Other Ambulatory Visit: Payer: Self-pay

## 2020-09-02 ENCOUNTER — Ambulatory Visit: Payer: Medicare Other | Admitting: Physical Therapy

## 2020-09-02 DIAGNOSIS — R2681 Unsteadiness on feet: Secondary | ICD-10-CM

## 2020-09-02 DIAGNOSIS — M6281 Muscle weakness (generalized): Secondary | ICD-10-CM | POA: Diagnosis not present

## 2020-09-02 DIAGNOSIS — R2689 Other abnormalities of gait and mobility: Secondary | ICD-10-CM | POA: Diagnosis not present

## 2020-09-03 NOTE — Therapy (Signed)
[facility] [address] [place], [place], [address] Phone: [phone]   Fax:  [phone]  Physical Therapy Treatment  Patient Details  Name: Trevor [name] MRN: [id number] Date of Birth: Mar 02, 1951 Referring Provider (PT): Fayette Pho, [place]   Encounter Date: 09/02/2020   PT End of Session - 09/03/20 1907    Visit Number 7    Number of Visits 9    Date for PT Re-Evaluation 08/29/20    Authorization Type Medicare/BCBS    Authorization Time Period 07-29-20 - 09-12-20    PT Start Time 1315    PT Stop Time 1405    PT Time Calculation (min) 50 min    Equipment Utilized During Treatment Gait belt    Activity Tolerance Patient tolerated treatment well    Behavior During Therapy [facility] for tasks assessed/performed           Past Medical History:  Diagnosis Date  . Acute pain of left shoulder 04/04/2017  . ADD (attention deficit disorder)   . Allergy   . Anxiety   . Arthritis    right foot  . Arthritis of midfoot 04/29/2016   Overview:  Added automatically from request for surgery [id number]  . Blood transfusion without reported diagnosis   . Cataract    removed both eyes with lens implants   . Chest wall pain 09/29/2017  . Chronic pain of right ankle 04/08/2016  . Cough due to ACE inhibitor 03/18/2017  . Depression   . Descending thoracic aortic aneurysm ([place]) 04/04/2017   Overview:  4.5 cm on CT scan of April 04, 2017.  . Diverticulosis of colon 11/25/2015  . Dyspnea on exertion 04/04/2017  . Essential hypertension 11/25/2015  . Executive function deficit 12/31/2015  . Family history of colon cancer in mother    dx'd age late 72's  . Foot pain, right 04/08/2016  . Gait abnormality 03/29/2019  . H/O blood clots   . Health maintenance examination 09/29/2017  . History of colonic polyps 02/04/2011   3 10 mm hyperplastic polyps removed from right and left colon 2004 4 polyps, one 10 mm others diminutive and hyperplastic 2007  Followed closer than routine risk due to multiple large hyperplastic polyps 5 adenomas with one of them serrated  05/29/2015 11 polyps max 10 mm adenomas, ssa's, hyperplastic - 09/21/2017 3 diminutive polyps removed adenomas - recall 2021 (2 yrs)     . Hypercholesterolemia 11/25/2015  . Hyperlipidemia   . Hypertension   . Left foot pain 01/27/2016  . Nocturia more than twice per night 12/31/2015  . Pain in joints 12/31/2015  . Pectoralis major tendinitis, right 03/18/2017  . Pericarditis   . Peripheral neuropathy 04/24/2018  . Pneumonia   . Primary osteoarthritis of right foot 11/14/2015  . Shortness of breath 09/29/2017  . Status post right foot surgery 06/23/2016  . Strain of right pectoralis muscle 09/07/2016  . Thoracic aortic aneurysm ([place]) 06/08/2017  . Traumatic brain injury ([place])    1976  . Ulcer   . Varicose veins   . Varicose veins of lower extremities with ulcer ([place]) 11/16/2012    Past Surgical History:  Procedure Laterality Date  . BURR HOLE OF CRANIUM  04/10/75  . COLONOSCOPY    . COLONOSCOPY W/ POLYPECTOMY  2004, 2007, 02/03/11   multiple large hyperplastic polyps 04/07, adenomas and one serrated 2012  . ENDOVENOUS ABLATION SAPHENOUS VEIN W/ LASER Left 09-02-2010   EVLA left great and small saphenous vein by Sherren Mocha  Early MD   . FOOT FUSION Right    [place]  . neck fusion    . POLYPECTOMY    . ROTATOR CUFF REPAIR Right 2019  . rt. knee replacement    . THORACIC AORTIC ENDOVASCULAR STENT GRAFT N/A 06/08/2017   Procedure: THORACIC AORTIC ENDOVASCULAR STENT GRAFT;  Surgeon: Rosetta Posner, MD;  Location: [facility] OR;  Service: Vascular;  Laterality: N/A;    There were no vitals filed for this visit.   Subjective Assessment - 09/03/20 1902    Subjective Pt states he missed appt. on Monday because they were busy with ordering something online and lost track of time    Patient Stated Goals Improve Rt ankle strength and ROM and improve balance    Currently in Pain? No/denies                              [facility] Adult PT Treatment/Exercise - 09/03/20 0001      Transfers   Transfers Sit to Stand;Stand to Sit    Number of Reps Other reps (comment)   3 during session   Comments with 1 UE support      Ambulation/Gait   Ambulation/Gait Yes    Ambulation/Gait Assistance 5: Supervision    Ambulation Distance (Feet) 115 Feet    Assistive device None    Gait Pattern Within Functional Limits    Ambulation Surface Level;Indoor      Berg Balance Test   Sit to Stand Able to stand  independently using hands    Standing Unsupported Able to stand safely 2 minutes    Sitting with Back Unsupported but Feet Supported on Floor or Stool Able to sit safely and securely 2 minutes    Stand to Sit Sits safely with minimal use of hands    Transfers Able to transfer safely, definite need of hands    Standing Unsupported with Eyes Closed Able to stand 10 seconds safely    Standing Ubsupported with Feet Together Able to place feet together independently and stand 1 minute safely    From Standing, Reach Forward with Outstretched Arm Can reach confidently >25 cm (10")    From Standing Position, Pick up Object from Floor Able to pick up shoe safely and easily    From Standing Position, Turn to Look Behind Over each Shoulder Looks behind from both sides and weight shifts well    Turn 360 Degrees Able to turn 360 degrees safely but slowly   R=4.68  L=5.16   Standing Unsupported, Alternately Place Feet on Step/Stool Able to stand independently and safely and complete 8 steps in 20 seconds    Standing Unsupported, One Foot in Front Able to plae foot ahead of the other independently and hold 30 seconds    Standing on One Leg Tries to lift leg/unable to hold 3 seconds but remains standing independently    Total Score 48      Knee/Hip Exercises: Machines for Strengthening   Cybex Leg Press 60# bil. LE's 3 sets 10 reps                       PT Long Term Goals  - 09/02/20 1323      PT LONG TERM GOAL #1   Title Increase Berg score by at least 5 points for reduced fall risk.    Baseline 39/56 on 07-31-20; score 48/56 on 09-02-20    Time  4    Period Weeks    Status Achieved      PT LONG TERM GOAL #2   Title Improve TUG score to </= 23 secs to decrease fall risk.    Baseline 29.85 secs with no device;  14.57 secs with no device    Time 4    Period Weeks    Status Achieved      PT LONG TERM GOAL #3   Title Independent in HEP for balance and Rt ankle strengthening.    Time 4    Period Weeks    Status Achieved      PT LONG TERM GOAL #4   Title Increase gait speed to >/= 2.4 ft/sec without device  for incr. gait efficiency.    Baseline 16.53 secs without device; 12.07 secs without device = 2.72 ft/sec without device    Time 4    Period Weeks    Status Achieved                 Plan - 09/02/20 1322    Clinical Impression Statement Pt has met 4/4 LTG's.  Berg score has increased from 39/56 to 48/56.  Pt continues to have some difficulty with sit to stand without UE support.  Plan to renew for 4 weeks to address LE weakness, gait and balance deficits.    Personal Factors and Comorbidities Past/Current Experience;Comorbidity 2;Transportation;Time since onset of injury/illness/exacerbation    Examination-Activity Limitations Bend;Carry;Lift;Stairs;Transfers;Locomotion Level;Squat    Examination-Participation Restrictions Cleaning;Community Activity;Driving;Interpersonal Relationship;Shop    Stability/Clinical Decision Making Evolving/Moderate complexity    Rehab Potential Good    PT Frequency 2x / week    PT Duration 4 weeks    PT Treatment/Interventions ADLs/Self Care Home Management;Gait training;Stair training;Therapeutic activities;Therapeutic exercise;Balance training;Neuromuscular re-education;Patient/family education;DME Instruction    PT Next Visit Plan Rt ankle strengthening; balance exercises    Consulted and Agree with Plan of  Care Patient           Patient will benefit from skilled therapeutic intervention in order to improve the following deficits and impairments:  Abnormal gait,Decreased activity tolerance,Decreased balance,Decreased coordination,Decreased strength,Impaired sensation,Postural dysfunction  Visit Diagnosis: Other abnormalities of gait and mobility  Muscle weakness (generalized)  Unsteadiness on feet     Problem List Patient Active Problem List   Diagnosis Date Noted  . Gait abnormality 03/29/2019  . Peripheral neuropathy 04/24/2018  . Chest wall pain 09/29/2017  . Shortness of breath 09/29/2017  . Health maintenance examination 09/29/2017  . Thoracic aortic aneurysm ([place]) 06/08/2017  . Acute pain of left shoulder 04/04/2017  . Descending thoracic aortic aneurysm ([place]) 04/04/2017  . Dyspnea on exertion 04/04/2017  . Cough due to ACE inhibitor 03/18/2017  . Pectoralis major tendinitis, right 03/18/2017  . Strain of right pectoralis muscle 09/07/2016  . Status post right foot surgery 06/23/2016  . Arthritis of midfoot 04/29/2016  . Chronic pain of right ankle 04/08/2016  . Foot pain, right 04/08/2016  . Left foot pain 01/27/2016  . Executive function deficit 12/31/2015  . Nocturia more than twice per night 12/31/2015  . Pain in joints 12/31/2015  . ADD (attention deficit disorder) 11/25/2015  . Depression 11/25/2015  . Diverticulosis of colon 11/25/2015  . Essential hypertension 11/25/2015  . Hypercholesterolemia 11/25/2015  . Primary osteoarthritis of right foot 11/14/2015  . Family history of colon cancer in elderly mother 05/29/2015  . Varicose veins of lower extremities with ulcer ([place]) 11/16/2012  . History of colonic polyps 02/04/2011    Ethelene Browns  Vinnie Level, PT 09/03/2020, 7:17 PM  [facility] [address] [place], [place], [address] Phone: [phone]   Fax:  [phone]  Name: CLANTON EMANUELSON MRN: [id number] Date of Birth: 08/12/1951

## 2020-09-09 ENCOUNTER — Ambulatory Visit: Payer: Medicare Other | Admitting: Physical Therapy

## 2020-09-11 ENCOUNTER — Ambulatory Visit: Payer: Medicare Other | Admitting: Physical Therapy

## 2020-09-11 ENCOUNTER — Other Ambulatory Visit: Payer: Self-pay

## 2020-09-11 DIAGNOSIS — M6281 Muscle weakness (generalized): Secondary | ICD-10-CM

## 2020-09-11 DIAGNOSIS — R2689 Other abnormalities of gait and mobility: Secondary | ICD-10-CM

## 2020-09-11 DIAGNOSIS — R2681 Unsteadiness on feet: Secondary | ICD-10-CM

## 2020-09-12 ENCOUNTER — Encounter: Payer: Self-pay | Admitting: Physical Therapy

## 2020-09-12 NOTE — Therapy (Signed)
[facility] [address] [place], [place], [address] Phone: [phone]   Fax:  [phone]  Physical Therapy Treatment  Patient Details  Name: Trevor [name] MRN: [id number] Date of Birth: [birth year] Referring Provider (PT): Fayette Pho, [place]   Encounter Date: 09/11/2020   PT End of Session - 09/12/20 1425    Visit Number 8    Number of Visits 12    Date for PT Re-Evaluation 10/10/20    Authorization Type Medicare/BCBS    Authorization Time Period 07-29-20 - 09-12-20    PT Start Time [id number]    PT Stop Time 1403    PT Time Calculation (min) 46 min    Equipment Utilized During Treatment --    Activity Tolerance Patient tolerated treatment well    Behavior During Therapy [facility] for tasks assessed/performed           Past Medical History:  Diagnosis Date  . Acute pain of left shoulder 04/04/2017  . ADD (attention deficit disorder)   . Allergy   . Anxiety   . Arthritis    right foot  . Arthritis of midfoot 04/29/2016   Overview:  Added automatically from request for surgery [id number]  . Blood transfusion without reported diagnosis   . Cataract    removed both eyes with lens implants   . Chest wall pain 09/29/2017  . Chronic pain of right ankle 04/08/2016  . Cough due to ACE inhibitor 03/18/2017  . Depression   . Descending thoracic aortic aneurysm ([place]) 04/04/2017   Overview:  4.5 cm on CT scan of April 04, 2017.  . Diverticulosis of colon 11/25/2015  . Dyspnea on exertion 04/04/2017  . Essential hypertension 11/25/2015  . Executive function deficit 12/31/2015  . Family history of colon cancer in mother    dx'd age late 80's  . Foot pain, right 04/08/2016  . Gait abnormality 03/29/2019  . H/O blood clots   . Health maintenance examination 09/29/2017  . History of colonic polyps 02/04/2011   3 10 mm hyperplastic polyps removed from right and left colon 2004 4 polyps, one 10 mm others diminutive and hyperplastic 2007 Followed  closer than routine risk due to multiple large hyperplastic polyps 5 adenomas with one of them serrated  05/29/2015 11 polyps max 10 mm adenomas, ssa's, hyperplastic - 09/21/2017 3 diminutive polyps removed adenomas - recall 2021 (2 yrs)     . Hypercholesterolemia 11/25/2015  . Hyperlipidemia   . Hypertension   . Left foot pain 01/27/2016  . Nocturia more than twice per night 12/31/2015  . Pain in joints 12/31/2015  . Pectoralis major tendinitis, right 03/18/2017  . Pericarditis   . Peripheral neuropathy 04/24/2018  . Pneumonia   . Primary osteoarthritis of right foot 11/14/2015  . Shortness of breath 09/29/2017  . Status post right foot surgery 06/23/2016  . Strain of right pectoralis muscle 09/07/2016  . Thoracic aortic aneurysm ([place]) 06/08/2017  . Traumatic brain injury ([place])    1976  . Ulcer   . Varicose veins   . Varicose veins of lower extremities with ulcer ([place]) 11/16/2012    Past Surgical History:  Procedure Laterality Date  . BURR HOLE OF CRANIUM  04/10/75  . COLONOSCOPY    . COLONOSCOPY W/ POLYPECTOMY  2004, 2007, 02/03/11   multiple large hyperplastic polyps 04/07, adenomas and one serrated 2012  . ENDOVENOUS ABLATION SAPHENOUS VEIN W/ LASER Left 09-02-2010   EVLA left great and small saphenous vein by Curt Jews  MD   . FOOT FUSION Right    [facility]  . neck fusion    . POLYPECTOMY    . ROTATOR CUFF REPAIR Right 2019  . rt. knee replacement    . THORACIC AORTIC ENDOVASCULAR STENT GRAFT N/A 06/08/2017   Procedure: THORACIC AORTIC ENDOVASCULAR STENT GRAFT;  Surgeon: Larina Earthly, MD;  Location: [facility] OR;  Service: Vascular;  Laterality: N/A;    There were no vitals filed for this visit.   Subjective Assessment - 09/11/20 1322    Subjective Pt states his RLE his swollen more today  but denies pain in the leg;  pt acknowledges that he is aware he missed appt on Tuesday, but does not state why    Patient Stated Goals Improve Rt ankle strength and ROM and improve balance     Currently in Pain? No/denies                             [facility] Adult PT Treatment/Exercise - 09/12/20 0001      Transfers   Transfers Sit to Stand;Stand to Sit    Sit to Stand 5: Supervision    Five time sit to stand comments  22.10   with bil. UE support from standard chair     Ambulation/Gait   Ambulation/Gait Yes    Ambulation/Gait Assistance 5: Supervision    Ambulation Distance (Feet) 100 Feet    Assistive device None    Gait Pattern Within Functional Limits      Knee/Hip Exercises: Machines for Strengthening   Cybex Leg Press 100# bil. LE's 3 sets 10 reps      Knee/Hip Exercises: Standing   Hip Extension Stengthening;Right;Left;2 sets;10 reps;Knee straight   3# weight used on each leg              Balance Exercises - 09/12/20 0001      Balance Exercises: Standing   Rockerboard Anterior/posterior;EO;Other reps (comment)   3 sets 10 reps with less UE support   Marching Solid surface;Static;10 reps    Other Standing Exercises pt performed standing and reaching down toward floor - 3 reps toward Rt foot and then 3 reps toward Lt foot with pt having wide stance for good BOS;  then reaching down toward floor straight down between both legs  3 reps    Other Standing Exercises Comments tap ups alternating 10 reps each foot to 1st step with CGA; to 2nd step with UE support on rail prn with CGA                  PT Long Term Goals - 09/12/20 1429      PT LONG TERM GOAL #1   Title Increase Berg score by at least 5 points for reduced fall risk.  Increase score to >/= 52/56 for reduced fall risk.    Baseline 39/56 on 07-31-20; score 48/56 on 09-02-20    Time 4    Period Weeks    Status Achieved    Target Date 10/10/20      PT LONG TERM GOAL #2   Title Improve TUG score to </= 13.5 secs to decrease fall risk.    Baseline 29.85 secs with no device;  14.57 secs with no device 09-02-20    Time 4    Period Weeks    Status New    Target Date  10/10/20      PT LONG TERM GOAL #3  Title Independent in HEP for balance and Rt ankle strengthening.    Time 4    Period Weeks    Status Achieved      PT LONG TERM GOAL #4   Title Increase gait speed to >/= 3.0 ft/sec without device  for incr. gait efficiency.    Baseline 16.53 secs without device; 12.07 secs without device = 2.72 ft/sec without device    Time 4    Period Weeks    Status New    Target Date 10/10/20      PT LONG TERM GOAL #5   Title Pt will perform sit to stand transfer from mat with only 1 UE support.    Time 4    Period Weeks    Status New    Target Date 10/10/20      Additional Long Term Goals   Additional Long Term Goals Yes      PT LONG TERM GOAL #6   Title Perform floor to stand transfer with UE support on object with SBA.    Time 4    Period Weeks    Status New    Target Date 10/10/20      PT LONG TERM GOAL #7   Title Pt will bend down and pick up object off floor without LOB with pt reporting increased ease with this activity.    Time 4    Period Weeks    Status New    Target Date 10/10/20                 Plan - 09/12/20 1426    Clinical Impression Statement Pt is progressing with mobility - demonstrates increased ease with bending down to floor to retrieve item, with wide stance for increased BOS for stability.  Gait continues to improve with no LOB noted and with pt not using SPC for assistance with gait at this time.  Pt does continue to have difficulty with sit to stand transfers with need for UE assist due to weak hip extensors.  Cont with POC.    Personal Factors and Comorbidities Past/Current Experience;Comorbidity 2;Transportation;Time since onset of injury/illness/exacerbation    Examination-Activity Limitations Bend;Carry;Lift;Stairs;Transfers;Locomotion Level;Squat    Examination-Participation Restrictions Cleaning;Community Activity;Driving;Interpersonal Relationship;Shop    Stability/Clinical Decision Making Evolving/Moderate  complexity    Rehab Potential Good    PT Frequency 1x / week    PT Duration 4 weeks    PT Treatment/Interventions ADLs/Self Care Home Management;Gait training;Stair training;Therapeutic activities;Therapeutic exercise;Balance training;Neuromuscular re-education;Patient/family education;DME Instruction    PT Next Visit Plan Rt ankle strengthening; balance exercises    Consulted and Agree with Plan of Care Patient           Patient will benefit from skilled therapeutic intervention in order to improve the following deficits and impairments:  Abnormal gait,Decreased activity tolerance,Decreased balance,Decreased coordination,Decreased strength,Impaired sensation,Postural dysfunction  Visit Diagnosis: Other abnormalities of gait and mobility  Muscle weakness (generalized)  Unsteadiness on feet     Problem List Patient Active Problem List   Diagnosis Date Noted  . Gait abnormality 03/29/2019  . Peripheral neuropathy 04/24/2018  . Chest wall pain 09/29/2017  . Shortness of breath 09/29/2017  . Health maintenance examination 09/29/2017  . Thoracic aortic aneurysm ([place]) 06/08/2017  . Acute pain of left shoulder 04/04/2017  . Descending thoracic aortic aneurysm ([place]) 04/04/2017  . Dyspnea on exertion 04/04/2017  . Cough due to ACE inhibitor 03/18/2017  . Pectoralis major tendinitis, right 03/18/2017  . Strain of right pectoralis muscle 09/07/2016  .  Status post right foot surgery 06/23/2016  . Arthritis of midfoot 04/29/2016  . Chronic pain of right ankle 04/08/2016  . Foot pain, right 04/08/2016  . Left foot pain 01/27/2016  . Executive function deficit 12/31/2015  . Nocturia more than twice per night 12/31/2015  . Pain in joints 12/31/2015  . ADD (attention deficit disorder) 11/25/2015  . Depression 11/25/2015  . Diverticulosis of colon 11/25/2015  . Essential hypertension 11/25/2015  . Hypercholesterolemia 11/25/2015  . Primary osteoarthritis of right foot 11/14/2015  .  Family history of colon cancer in elderly mother 05/29/2015  . Varicose veins of lower extremities with ulcer ([place]) 11/16/2012  . History of colonic polyps 02/04/2011    Alda Lea, PT 09/12/2020, 2:37 PM  [facility] [address] [place], [place], [address] Phone: [phone]   Fax:  [phone]  Name: Trevor [name] MRN: [id number] Date of Birth: Jan 18, 1951

## 2020-09-16 ENCOUNTER — Other Ambulatory Visit: Payer: Self-pay

## 2020-09-16 ENCOUNTER — Encounter: Payer: Self-pay | Admitting: Physical Therapy

## 2020-09-16 ENCOUNTER — Ambulatory Visit: Payer: Medicare Other | Attending: Surgical | Admitting: Physical Therapy

## 2020-09-16 DIAGNOSIS — R2689 Other abnormalities of gait and mobility: Secondary | ICD-10-CM | POA: Diagnosis not present

## 2020-09-16 DIAGNOSIS — M6281 Muscle weakness (generalized): Secondary | ICD-10-CM | POA: Insufficient documentation

## 2020-09-16 DIAGNOSIS — R2681 Unsteadiness on feet: Secondary | ICD-10-CM | POA: Diagnosis not present

## 2020-09-16 NOTE — Therapy (Signed)
[facility] [address] [place], [place], [address] Phone: [phone]   Fax:  [phone]  Physical Therapy Treatment  Patient Details  Name: Trevor [name] MRN: [id number] Date of Birth: May 30, 1951 Referring Provider (PT): Fayette Pho, [place]   Encounter Date: 09/16/2020   PT End of Session - 09/16/20 2052    Visit Number 9    Number of Visits 12    Date for PT Re-Evaluation 10/10/20    Authorization Type Medicare/BCBS    Authorization Time Period 07-29-20 - 09-12-20; 09-16-20 - 10-17-20    PT Start Time [id number]    PT Stop Time 1620    PT Time Calculation (min) 45 min    Activity Tolerance Patient tolerated treatment well    Behavior During Therapy [facility] for tasks assessed/performed           Past Medical History:  Diagnosis Date  . Acute pain of left shoulder 04/04/2017  . ADD (attention deficit disorder)   . Allergy   . Anxiety   . Arthritis    right foot  . Arthritis of midfoot 04/29/2016   Overview:  Added automatically from request for surgery [id number]  . Blood transfusion without reported diagnosis   . Cataract    removed both eyes with lens implants   . Chest wall pain 09/29/2017  . Chronic pain of right ankle 04/08/2016  . Cough due to ACE inhibitor 03/18/2017  . Depression   . Descending thoracic aortic aneurysm ([place]) 04/04/2017   Overview:  4.5 cm on CT scan of April 04, 2017.  . Diverticulosis of colon 11/25/2015  . Dyspnea on exertion 04/04/2017  . Essential hypertension 11/25/2015  . Executive function deficit 12/31/2015  . Family history of colon cancer in mother    dx'd age late 24's  . Foot pain, right 04/08/2016  . Gait abnormality 03/29/2019  . H/O blood clots   . Health maintenance examination 09/29/2017  . History of colonic polyps 02/04/2011   3 10 mm hyperplastic polyps removed from right and left colon 2004 4 polyps, one 10 mm others diminutive and hyperplastic 2007 Followed closer than routine risk due  to multiple large hyperplastic polyps 5 adenomas with one of them serrated  05/29/2015 11 polyps max 10 mm adenomas, ssa's, hyperplastic - 09/21/2017 3 diminutive polyps removed adenomas - recall 2021 (2 yrs)     . Hypercholesterolemia 11/25/2015  . Hyperlipidemia   . Hypertension   . Left foot pain 01/27/2016  . Nocturia more than twice per night 12/31/2015  . Pain in joints 12/31/2015  . Pectoralis major tendinitis, right 03/18/2017  . Pericarditis   . Peripheral neuropathy 04/24/2018  . Pneumonia   . Primary osteoarthritis of right foot 11/14/2015  . Shortness of breath 09/29/2017  . Status post right foot surgery 06/23/2016  . Strain of right pectoralis muscle 09/07/2016  . Thoracic aortic aneurysm ([place]) 06/08/2017  . Traumatic brain injury ([place])    1976  . Ulcer   . Varicose veins   . Varicose veins of lower extremities with ulcer ([place]) 11/16/2012    Past Surgical History:  Procedure Laterality Date  . BURR HOLE OF CRANIUM  04/10/75  . COLONOSCOPY    . COLONOSCOPY W/ POLYPECTOMY  2004, 2007, 02/03/11   multiple large hyperplastic polyps 04/07, adenomas and one serrated 2012  . ENDOVENOUS ABLATION SAPHENOUS VEIN W/ LASER Left 09-02-2010   EVLA left great and small saphenous vein by Curt Jews MD   . FOOT  FUSION Right    [place]  . neck fusion    . POLYPECTOMY    . ROTATOR CUFF REPAIR Right 2019  . rt. knee replacement    . THORACIC AORTIC ENDOVASCULAR STENT GRAFT N/A 06/08/2017   Procedure: THORACIC AORTIC ENDOVASCULAR STENT GRAFT;  Surgeon: Rosetta Posner, MD;  Location: [facility] OR;  Service: Vascular;  Laterality: N/A;    There were no vitals filed for this visit.   Subjective Assessment - 09/16/20 1540    Subjective Pt states he is doing well - is ambulating without use of cane; pt states he is getting used to the shoe lift on the Rt shoe; he reports doing the theraband exercises 3 times/week    Patient Stated Goals Improve Rt ankle strength and ROM and improve balance     Currently in Pain? No/denies                             [facility] Adult PT Treatment/Exercise - 09/16/20 1559      Transfers   Transfers Sit to Stand;Stand to Sit    Sit to Stand 5: Supervision    Sit to Stand Details (indicate cue type and reason) from mat table without UE support attempted - very difficult for pt to do without UE support - pt had LOB posteriorly on the initial rep    Number of Reps Other reps (comment)   3 reps; pt instructed to try staggered stance (Rt foot in back to be able to push more with this leg than with LLE due to c/o Lt knee pain)     Ambulation/Gait   Ambulation/Gait Yes    Ambulation/Gait Assistance 5: Supervision    Ambulation Distance (Feet) 100 Feet    Assistive device None    Gait Pattern Within Functional Limits      Exercises   Exercises Knee/Hip      Knee/Hip Exercises: Machines for Strengthening   Cybex Leg Press 110# bil. LE's 3 sets 10 reps      Knee/Hip Exercises: Standing   Forward Step Up Right;1 set;10 reps;Hand Hold: 1;Step Height: 6"   attempted with LLE but too painful in Lt knee              Balance Exercises - 09/16/20 0001      Balance Exercises: Standing   Rockerboard Anterior/posterior;EO;Other reps (comment)   3 sets 10 reps with less UE support   Sidestepping 2 reps   with small squats inside // bars   Other Standing Exercises pt performed standing on Bosu inside // bars - weight shifts anteriorly/posteriorly 10 reps; laterally 10 reps; smal squats 10 reps standing on Bosu with minimal UE support on // bars    Other Standing Exercises Comments tap ups alternating 10 reps each foot to 1st step with CGA; to 2nd step with UE support on rail prn with CGA             PT Education - 09/16/20 2051    Education Details instructed pt to perform theraband exercises at least 4-5x /week for hip strengthening    Person(s) Educated Patient    Methods Explanation    Comprehension Verbalized understanding                PT Long Term Goals - 09/16/20 2059      PT LONG TERM GOAL #1   Title Increase Berg score by at least 5 points for  reduced fall risk.  Increase score to >/= 52/56 for reduced fall risk.    Baseline 39/56 on 07-31-20; score 48/56 on 09-02-20    Time 4    Period Weeks    Status Achieved      PT LONG TERM GOAL #2   Title Improve TUG score to </= 13.5 secs to decrease fall risk.    Baseline 29.85 secs with no device;  14.57 secs with no device 09-02-20    Time 4    Period Weeks    Status New    Target Date 10/10/20      PT LONG TERM GOAL #3   Title Independent in HEP for balance and Rt ankle strengthening.    Time 4    Period Weeks    Status Achieved      PT LONG TERM GOAL #4   Title Increase gait speed to >/= 3.0 ft/sec without device  for incr. gait efficiency.    Baseline 16.53 secs without device; 12.07 secs without device = 2.72 ft/sec without device    Time 4    Period Weeks    Status New    Target Date 10/10/20      PT LONG TERM GOAL #5   Title Pt will perform sit to stand transfer from mat with only 1 UE support.    Time 4    Period Weeks    Status New    Target Date 10/10/20      PT LONG TERM GOAL #6   Title Perform floor to stand transfer with UE support on object with SBA.    Time 4    Period Weeks    Status New    Target Date 10/10/20      PT LONG TERM GOAL #7   Title Pt will bend down and pick up object off floor without LOB with pt reporting increased ease with this activity.    Time 4    Period Weeks    Status New    Target Date 10/10/20                 Plan - 09/16/20 1542    Clinical Impression Statement Pt continues to have difficulty with sit to stand transfers when performing with reduced UE support.  Pt does demonstrate increased strength in bil. LE's as pt now able to increase weight on leg press from 100# to 110# in today's session.  Cont with POC.    Personal Factors and Comorbidities Past/Current  Experience;Comorbidity 2;Transportation;Time since onset of injury/illness/exacerbation    Examination-Activity Limitations Bend;Carry;Lift;Stairs;Transfers;Locomotion Level;Squat    Examination-Participation Restrictions Cleaning;Community Activity;Driving;Interpersonal Relationship;Shop    Stability/Clinical Decision Making Evolving/Moderate complexity    Rehab Potential Good    PT Frequency 1x / week    PT Duration 4 weeks    PT Treatment/Interventions ADLs/Self Care Home Management;Gait training;Stair training;Therapeutic activities;Therapeutic exercise;Balance training;Neuromuscular re-education;Patient/family education;DME Instruction    PT Next Visit Plan Sit to stand transfers;  floor to stand - Rt ankle strengthening; balance exercises    Consulted and Agree with Plan of Care Patient           Patient will benefit from skilled therapeutic intervention in order to improve the following deficits and impairments:  Abnormal gait,Decreased activity tolerance,Decreased balance,Decreased coordination,Decreased strength,Impaired sensation,Postural dysfunction  Visit Diagnosis: Other abnormalities of gait and mobility - Plan: PT plan of care cert/re-cert  Muscle weakness (generalized) - Plan: PT plan of care cert/re-cert  Unsteadiness on feet - Plan: PT  plan of care cert/re-cert     Problem List Patient Active Problem List   Diagnosis Date Noted  . Gait abnormality 03/29/2019  . Peripheral neuropathy 04/24/2018  . Chest wall pain 09/29/2017  . Shortness of breath 09/29/2017  . Health maintenance examination 09/29/2017  . Thoracic aortic aneurysm ([facility]) 06/08/2017  . Acute pain of left shoulder 04/04/2017  . Descending thoracic aortic aneurysm (HCC) 04/04/2017  . Dyspnea on exertion 04/04/2017  . Cough due to ACE inhibitor 03/18/2017  . Pectoralis major tendinitis, right 03/18/2017  . Strain of right pectoralis muscle 09/07/2016  . Status post right foot surgery 06/23/2016  .  Arthritis of midfoot 04/29/2016  . Chronic pain of right ankle 04/08/2016  . Foot pain, right 04/08/2016  . Left foot pain 01/27/2016  . Executive function deficit 12/31/2015  . Nocturia more than twice per night 12/31/2015  . Pain in joints 12/31/2015  . ADD (attention deficit disorder) 11/25/2015  . Depression 11/25/2015  . Diverticulosis of colon 11/25/2015  . Essential hypertension 11/25/2015  . Hypercholesterolemia 11/25/2015  . Primary osteoarthritis of right foot 11/14/2015  . Family history of colon cancer in elderly mother 05/29/2015  . Varicose veins of lower extremities with ulcer (HCC) 11/16/2012  . History of colonic polyps 02/04/2011    Kary Kos, PT 09/16/2020, 9:26 PM  [facility] [address] [place], [place], [address] Phone: [phone]   Fax:  [phone]  Name: Trevor [name] MRN: [id number] Date of Birth: [birth year]

## 2020-09-18 DIAGNOSIS — R3915 Urgency of urination: Secondary | ICD-10-CM | POA: Diagnosis not present

## 2020-09-23 ENCOUNTER — Other Ambulatory Visit: Payer: Self-pay

## 2020-09-23 ENCOUNTER — Ambulatory Visit: Payer: Medicare Other | Admitting: Physical Therapy

## 2020-09-23 DIAGNOSIS — M6281 Muscle weakness (generalized): Secondary | ICD-10-CM

## 2020-09-23 DIAGNOSIS — R2689 Other abnormalities of gait and mobility: Secondary | ICD-10-CM | POA: Diagnosis not present

## 2020-09-23 DIAGNOSIS — R2681 Unsteadiness on feet: Secondary | ICD-10-CM

## 2020-09-24 ENCOUNTER — Encounter: Payer: Self-pay | Admitting: Physical Therapy

## 2020-09-24 ENCOUNTER — Other Ambulatory Visit: Payer: Self-pay | Admitting: *Deleted

## 2020-09-24 DIAGNOSIS — I712 Thoracic aortic aneurysm, without rupture, unspecified: Secondary | ICD-10-CM

## 2020-09-24 NOTE — Therapy (Addendum)
[facility] [address] [place], [place], [address] Phone: [phone]   Fax:  [phone]  Physical Therapy Treatment & 10th Visit Progress Note   Reporting Period:  07-29-20 - 09-23-20 See below for progress towards goals  Patient Details  Name: Trevor [name] MRN: [id number] Date of Birth: 1951-06-07 Referring Provider (PT): Fayette Pho, [place]   Encounter Date: 09/23/2020   PT End of Session - 09/28/20 1323    Visit Number 10    Number of Visits 12    Date for PT Re-Evaluation 10/10/20    Authorization Type Medicare/BCBS    Authorization Time Period 07-29-20 - 09-12-20; 09-16-20 - 10-17-20    PT Start Time 1533    PT Stop Time 1620    PT Time Calculation (min) 47 min    Activity Tolerance Patient tolerated treatment well    Behavior During Therapy [facility] for tasks assessed/performed           Past Medical History:  Diagnosis Date  . Acute pain of left shoulder 04/04/2017  . ADD (attention deficit disorder)   . Allergy   . Anxiety   . Arthritis    right foot  . Arthritis of midfoot 04/29/2016   Overview:  Added automatically from request for surgery [id number]  . Blood transfusion without reported diagnosis   . Cataract    removed both eyes with lens implants   . Chest wall pain 09/29/2017  . Chronic pain of right ankle 04/08/2016  . Cough due to ACE inhibitor 03/18/2017  . Depression   . Descending thoracic aortic aneurysm ([place]) 04/04/2017   Overview:  4.5 cm on CT scan of April 04, 2017.  . Diverticulosis of colon 11/25/2015  . Dyspnea on exertion 04/04/2017  . Essential hypertension 11/25/2015  . Executive function deficit 12/31/2015  . Family history of colon cancer in mother    dx'd age late 92's  . Foot pain, right 04/08/2016  . Gait abnormality 03/29/2019  . H/O blood clots   . Health maintenance examination 09/29/2017  . History of colonic polyps 02/04/2011   3 10 mm hyperplastic polyps removed from right and left  colon 2004 4 polyps, one 10 mm others diminutive and hyperplastic 2007 Followed closer than routine risk due to multiple large hyperplastic polyps 5 adenomas with one of them serrated  05/29/2015 11 polyps max 10 mm adenomas, ssa's, hyperplastic - 09/21/2017 3 diminutive polyps removed adenomas - recall 2021 (2 yrs)     . Hypercholesterolemia 11/25/2015  . Hyperlipidemia   . Hypertension   . Left foot pain 01/27/2016  . Nocturia more than twice per night 12/31/2015  . Pain in joints 12/31/2015  . Pectoralis major tendinitis, right 03/18/2017  . Pericarditis   . Peripheral neuropathy 04/24/2018  . Pneumonia   . Primary osteoarthritis of right foot 11/14/2015  . Shortness of breath 09/29/2017  . Status post right foot surgery 06/23/2016  . Strain of right pectoralis muscle 09/07/2016  . Thoracic aortic aneurysm ([place]) 06/08/2017  . Traumatic brain injury ([place])    1976  . Ulcer   . Varicose veins   . Varicose veins of lower extremities with ulcer ([place]) 11/16/2012    Past Surgical History:  Procedure Laterality Date  . BURR HOLE OF CRANIUM  04/10/75  . COLONOSCOPY    . COLONOSCOPY W/ POLYPECTOMY  2004, 2007, 02/03/11   multiple large hyperplastic polyps 04/07, adenomas and one serrated 2012  . ENDOVENOUS ABLATION SAPHENOUS VEIN W/ LASER  Left 09-02-2010   EVLA left great and small saphenous vein by Curt Jews MD   . FOOT FUSION Right    [place]  . neck fusion    . POLYPECTOMY    . ROTATOR CUFF REPAIR Right 2019  . rt. knee replacement    . THORACIC AORTIC ENDOVASCULAR STENT GRAFT N/A 06/08/2017   Procedure: THORACIC AORTIC ENDOVASCULAR STENT GRAFT;  Surgeon: Rosetta Posner, MD;  Location: [facility] OR;  Service: Vascular;  Laterality: N/A;    There were no vitals filed for this visit.                      [facility] Adult PT Treatment/Exercise - 09/28/20 0001      Transfers   Transfers Sit to Stand;Stand to Sit    Sit to Stand 5: Supervision      Therapeutic Activites     Therapeutic Activities Other Therapeutic Activities    Other Therapeutic Activities pt performed floor to stand transfers with SBA with UE support on mat table for support;  verbal cues given for positioning for sequence with transfer      Knee/Hip Exercises: Standing   Hip Extension Stengthening;Right;Left;2 sets;10 reps;Knee straight   3# weight used on each leg   Forward Step Up Right;1 set;10 reps;Hand Hold: 1;Step Height: 6"   attempted with LLE but too painful in Lt knee   Other Standing Knee Exercises standing Rt hip extension with 3# weight with knee flexed - 10 reps           NeuroRe-ed;  Pt transferred from standing to tall kneeling on mat with Susa Simmonds in front for UE support with CGA Performed 10 reps small mini squats - sitting back toward heels - cues to squeeze gluts together during return to upright position to tall kneeling (for hip extensor strengthening)              PT Long Term Goals - 09/24/20 2117      PT LONG TERM GOAL #1   Title Increase Berg score by at least 5 points for reduced fall risk.  Increase score to >/= 52/56 for reduced fall risk.    Baseline 39/56 on 07-31-20; score 48/56 on 09-02-20    Time 4    Period Weeks    Status Achieved      PT LONG TERM GOAL #2   Title Improve TUG score to </= 13.5 secs to decrease fall risk.    Baseline 29.85 secs with no device;  14.57 secs with no device 09-02-20    Time 4    Period Weeks    Status New      PT LONG TERM GOAL #3   Title Independent in HEP for balance and Rt ankle strengthening.    Time 4    Period Weeks    Status Achieved      PT LONG TERM GOAL #4   Title Increase gait speed to >/= 3.0 ft/sec without device  for incr. gait efficiency.    Baseline 16.53 secs without device; 12.07 secs without device = 2.72 ft/sec without device    Time 4    Period Weeks    Status New      PT LONG TERM GOAL #5   Title Pt will perform sit to stand transfer from mat with only 1 UE support.    Time 4     Period Weeks    Status New  PT LONG TERM GOAL #6   Title Perform floor to stand transfer with UE support on object with SBA.    Time 4    Period Weeks    Status New      PT LONG TERM GOAL #7   Title Pt will bend down and pick up object off floor without LOB with pt reporting increased ease with this activity.    Time 4    Period Weeks    Status New                 Plan - 09/28/20 1325    Clinical Impression Statement This 10th visit progress note covers dates 07-29-20 - 09-23-20.  Pt's Berg balance score has increased from 39/56 to 48/56 and TUG score has improved from 29.85 secs to 14.57 secs with no device used.  Pt has obtained a shoe lift on his Rt shoe and wears an air cast on his LLE to reduce Lt ankle supination in swing and stance phase of gait.  His gait is much improved with reduced gait deviations and improved balance and stability.    Personal Factors and Comorbidities Past/Current Experience;Comorbidity 2;Transportation;Time since onset of injury/illness/exacerbation    Examination-Activity Limitations Bend;Carry;Lift;Stairs;Transfers;Locomotion Level;Squat    Examination-Participation Restrictions Cleaning;Community Activity;Driving;Interpersonal Relationship;Shop    Stability/Clinical Decision Making Evolving/Moderate complexity    Rehab Potential Good    PT Frequency 1x / week    PT Duration 4 weeks    PT Treatment/Interventions ADLs/Self Care Home Management;Gait training;Stair training;Therapeutic activities;Therapeutic exercise;Balance training;Neuromuscular re-education;Patient/family education;DME Instruction    PT Next Visit Plan Sit to stand transfers;  floor to stand - Rt ankle strengthening; balance exercises    Consulted and Agree with Plan of Care Patient           Patient will benefit from skilled therapeutic intervention in order to improve the following deficits and impairments:  Abnormal gait,Decreased activity tolerance,Decreased  balance,Decreased coordination,Decreased strength,Impaired sensation,Postural dysfunction  Visit Diagnosis: Other abnormalities of gait and mobility  Muscle weakness (generalized)  Unsteadiness on feet     Problem List Patient Active Problem List   Diagnosis Date Noted  . Gait abnormality 03/29/2019  . Peripheral neuropathy 04/24/2018  . Chest wall pain 09/29/2017  . Shortness of breath 09/29/2017  . Health maintenance examination 09/29/2017  . Thoracic aortic aneurysm ([place]) 06/08/2017  . Acute pain of left shoulder 04/04/2017  . Descending thoracic aortic aneurysm ([place]) 04/04/2017  . Dyspnea on exertion 04/04/2017  . Cough due to ACE inhibitor 03/18/2017  . Pectoralis major tendinitis, right 03/18/2017  . Strain of right pectoralis muscle 09/07/2016  . Status post right foot surgery 06/23/2016  . Arthritis of midfoot 04/29/2016  . Chronic pain of right ankle 04/08/2016  . Foot pain, right 04/08/2016  . Left foot pain 01/27/2016  . Executive function deficit 12/31/2015  . Nocturia more than twice per night 12/31/2015  . Pain in joints 12/31/2015  . ADD (attention deficit disorder) 11/25/2015  . Depression 11/25/2015  . Diverticulosis of colon 11/25/2015  . Essential hypertension 11/25/2015  . Hypercholesterolemia 11/25/2015  . Primary osteoarthritis of right foot 11/14/2015  . Family history of colon cancer in elderly mother 05/29/2015  . Varicose veins of lower extremities with ulcer ([place]) 11/16/2012  . History of colonic polyps 02/04/2011    Alda Lea, PT 09/28/2020, 1:35 PM  [facility] [address] [place], [place], [address] Phone: [phone]   Fax:  [phone]  Name: Trevor [name] MRN: [id number] Date of Birth: Jan 13, 1951

## 2020-09-30 ENCOUNTER — Ambulatory Visit: Payer: Medicare Other | Admitting: Physical Therapy

## 2020-10-07 ENCOUNTER — Other Ambulatory Visit: Payer: Self-pay

## 2020-10-07 ENCOUNTER — Ambulatory Visit: Payer: Medicare Other | Admitting: Physical Therapy

## 2020-10-07 DIAGNOSIS — R2689 Other abnormalities of gait and mobility: Secondary | ICD-10-CM

## 2020-10-07 DIAGNOSIS — M6281 Muscle weakness (generalized): Secondary | ICD-10-CM | POA: Diagnosis not present

## 2020-10-07 DIAGNOSIS — R2681 Unsteadiness on feet: Secondary | ICD-10-CM | POA: Diagnosis not present

## 2020-10-08 ENCOUNTER — Encounter: Payer: Self-pay | Admitting: Physical Therapy

## 2020-10-08 NOTE — Therapy (Signed)
[facility] [address] [place], [place], [address] Phone: [phone]   Fax:  [phone]  Physical Therapy Treatment  Patient Details  Name: Trevor [name] MRN: [id number] Date of Birth: Dec 27, 1950 Referring Provider (PT): Fayette Pho, [place]   Encounter Date: 10/07/2020   PT End of Session - 10/08/20 1925    Visit Number 11    Number of Visits 12    Date for PT Re-Evaluation 10/10/20    Authorization Type Medicare/BCBS    Authorization Time Period 07-29-20 - 09-12-20; 09-16-20 - 10-17-20    PT Start Time 1540    PT Stop Time 1624    PT Time Calculation (min) 44 min    Activity Tolerance Patient tolerated treatment well    Behavior During Therapy [facility] for tasks assessed/performed           Past Medical History:  Diagnosis Date  . Acute pain of left shoulder 04/04/2017  . ADD (attention deficit disorder)   . Allergy   . Anxiety   . Arthritis    right foot  . Arthritis of midfoot 04/29/2016   Overview:  Added automatically from request for surgery [id number]  . Blood transfusion without reported diagnosis   . Cataract    removed both eyes with lens implants   . Chest wall pain 09/29/2017  . Chronic pain of right ankle 04/08/2016  . Cough due to ACE inhibitor 03/18/2017  . Depression   . Descending thoracic aortic aneurysm ([place]) 04/04/2017   Overview:  4.5 cm on CT scan of April 04, 2017.  . Diverticulosis of colon 11/25/2015  . Dyspnea on exertion 04/04/2017  . Essential hypertension 11/25/2015  . Executive function deficit 12/31/2015  . Family history of colon cancer in mother    dx'd age late 75's  . Foot pain, right 04/08/2016  . Gait abnormality 03/29/2019  . H/O blood clots   . Health maintenance examination 09/29/2017  . History of colonic polyps 02/04/2011   3 10 mm hyperplastic polyps removed from right and left colon 2004 4 polyps, one 10 mm others diminutive and hyperplastic 2007 Followed closer than routine risk due  to multiple large hyperplastic polyps 5 adenomas with one of them serrated  05/29/2015 11 polyps max 10 mm adenomas, ssa's, hyperplastic - 09/21/2017 3 diminutive polyps removed adenomas - recall 2021 (2 yrs)     . Hypercholesterolemia 11/25/2015  . Hyperlipidemia   . Hypertension   . Left foot pain 01/27/2016  . Nocturia more than twice per night 12/31/2015  . Pain in joints 12/31/2015  . Pectoralis major tendinitis, right 03/18/2017  . Pericarditis   . Peripheral neuropathy 04/24/2018  . Pneumonia   . Primary osteoarthritis of right foot 11/14/2015  . Shortness of breath 09/29/2017  . Status post right foot surgery 06/23/2016  . Strain of right pectoralis muscle 09/07/2016  . Thoracic aortic aneurysm ([place]) 06/08/2017  . Traumatic brain injury ([place])    1976  . Ulcer   . Varicose veins   . Varicose veins of lower extremities with ulcer ([place]) 11/16/2012    Past Surgical History:  Procedure Laterality Date  . BURR HOLE OF CRANIUM  04/10/75  . COLONOSCOPY    . COLONOSCOPY W/ POLYPECTOMY  2004, 2007, 02/03/11   multiple large hyperplastic polyps 04/07, adenomas and one serrated 2012  . ENDOVENOUS ABLATION SAPHENOUS VEIN W/ LASER Left 09-02-2010   EVLA left great and small saphenous vein by Curt Jews MD   . FOOT  FUSION Right    [place]  . neck fusion    . POLYPECTOMY    . ROTATOR CUFF REPAIR Right 2019  . rt. knee replacement    . THORACIC AORTIC ENDOVASCULAR STENT GRAFT N/A 06/08/2017   Procedure: THORACIC AORTIC ENDOVASCULAR STENT GRAFT;  Surgeon: Rosetta Posner, MD;  Location: [facility] OR;  Service: Vascular;  Laterality: N/A;    There were no vitals filed for this visit.   Subjective Assessment - 10/07/20 1545    Subjective Pt states he feels like he is doing well - states he has started working on his left leg (strengthening); reports he feels he is ready to finish up PT today    Patient Stated Goals Improve Rt ankle strength and ROM and improve balance    Currently in Pain?  No/denies                             [facility] Adult PT Treatment/Exercise - 10/08/20 0001      Transfers   Transfers Sit to Stand;Stand to Sit    Sit to Stand 5: Supervision      Ambulation/Gait   Gait velocity 11.56 secs = 2.84 ft/sec with no device      Standardized Balance Assessment   Standardized Balance Assessment Timed Up and Go Test      Berg Balance Test   Sit to Stand Able to stand without using hands and stabilize independently    Standing Unsupported Able to stand safely 2 minutes    Sitting with Back Unsupported but Feet Supported on Floor or Stool Able to sit safely and securely 2 minutes    Stand to Sit Sits safely with minimal use of hands    Transfers Able to transfer safely, definite need of hands    Standing Unsupported with Eyes Closed Able to stand 10 seconds safely    Standing Ubsupported with Feet Together Able to place feet together independently and stand 1 minute safely    From Standing, Reach Forward with Outstretched Arm Can reach confidently >25 cm (10")    From Standing Position, Pick up Object from Floor Able to pick up shoe safely and easily    From Standing Position, Turn to Look Behind Over each Shoulder Looks behind from both sides and weight shifts well    Turn 360 Degrees Able to turn 360 degrees safely one side only in 4 seconds or less   R=3.47    L=4.44   Standing Unsupported, Alternately Place Feet on Step/Stool Able to stand independently and safely and complete 8 steps in 20 seconds    Standing Unsupported, One Foot in Front Able to plae foot ahead of the other independently and hold 30 seconds    Standing on One Leg Tries to lift leg/unable to hold 3 seconds but remains standing independently    Total Score 50      Timed Up and Go Test   TUG Normal TUG    Normal TUG (seconds) 15.62   no device used         Pt performed bending down from standing position to pick up stop watch off floor - pt had no LOB; verbal cue  given to widen stance For increased BOS and stability         PT Education - 10/08/20 1930    Education Details reviewed progress and LTG's; continue to recommend aquatic exercise for ongoing exercise program due to  less stress on joints    Person(s) Educated Patient    Methods Explanation    Comprehension Verbalized understanding               PT Long Term Goals - 10/07/20 1547      PT LONG TERM GOAL #1   Title Increase Berg score by at least 5 points for reduced fall risk.  Increase score to >/= 52/56 for reduced fall risk.    Baseline 39/56 on 07-31-20; score 48/56 on 09-02-20; Berg score 50/56 on 10-07-20    Time 4    Period Weeks    Status Achieved      PT LONG TERM GOAL #2   Title Improve TUG score to </= 13.5 secs to decrease fall risk.    Baseline 29.85 secs with no device;  14.57 secs with no device 09-02-20;  15.62 secs on 10-07-20    Time 4    Period Weeks    Status --      PT LONG TERM GOAL #3   Title Independent in HEP for balance and Rt ankle strengthening.    Time 4    Period Weeks    Status Achieved      PT LONG TERM GOAL #4   Title Increase gait speed to >/= 3.0 ft/sec without device  for incr. gait efficiency.    Baseline 16.53 secs without device; 12.07 secs without device = 2.72 ft/sec without device;  11.56 secs no device= 2.84 ft/sec on 10-07-20    Time 4    Period Weeks    Status Partially Met      PT LONG TERM GOAL #5   Title Pt will perform sit to stand transfer from mat with only 1 UE support.    Baseline met 10-07-20    Time 4    Period Weeks    Status Achieved      PT LONG TERM GOAL #6   Title Perform floor to stand transfer with UE support on object with SBA.    Time 4    Period Weeks    Status Achieved      PT LONG TERM GOAL #7   Title Pt will bend down and pick up object off floor without LOB with pt reporting increased ease with this activity.    Time 4    Period Weeks    Status Achieved                 Plan -  10/08/20 1925    Clinical Impression Statement Pt has met LTG's #1, 3, 5, 6, and 7:  LTG #2 not met as TUG score = 15.62 secs;  LTG #4 partially met as gait velocity has increased to 2.84 ft/sec without device, but not to 3.0 ft/sec per stated goal. Pt is discharged due to goals met and plateau in maximizing functional status at this time.  Pt agrees with D/C.    Personal Factors and Comorbidities Past/Current Experience;Comorbidity 2;Transportation;Time since onset of injury/illness/exacerbation    Examination-Activity Limitations Bend;Carry;Lift;Stairs;Transfers;Locomotion Level;Squat    Examination-Participation Restrictions Cleaning;Community Activity;Driving;Interpersonal Relationship;Shop    Stability/Clinical Decision Making Evolving/Moderate complexity    Rehab Potential Good    PT Frequency 1x / week    PT Duration 4 weeks    PT Treatment/Interventions ADLs/Self Care Home Management;Gait training;Stair training;Therapeutic activities;Therapeutic exercise;Balance training;Neuromuscular re-education;Patient/family education;DME Instruction    PT Next Visit Plan D/C on 10-07-20    Consulted and Agree with Plan of Care Patient  Patient will benefit from skilled therapeutic intervention in order to improve the following deficits and impairments:  Abnormal gait,Decreased activity tolerance,Decreased balance,Decreased coordination,Decreased strength,Impaired sensation,Postural dysfunction  Visit Diagnosis: Other abnormalities of gait and mobility  Muscle weakness (generalized)  Unsteadiness on feet     Problem List Patient Active Problem List   Diagnosis Date Noted  . Gait abnormality 03/29/2019  . Peripheral neuropathy 04/24/2018  . Chest wall pain 09/29/2017  . Shortness of breath 09/29/2017  . Health maintenance examination 09/29/2017  . Thoracic aortic aneurysm ([place]) 06/08/2017  . Acute pain of left shoulder 04/04/2017  . Descending thoracic aortic aneurysm ([place])  04/04/2017  . Dyspnea on exertion 04/04/2017  . Cough due to ACE inhibitor 03/18/2017  . Pectoralis major tendinitis, right 03/18/2017  . Strain of right pectoralis muscle 09/07/2016  . Status post right foot surgery 06/23/2016  . Arthritis of midfoot 04/29/2016  . Chronic pain of right ankle 04/08/2016  . Foot pain, right 04/08/2016  . Left foot pain 01/27/2016  . Executive function deficit 12/31/2015  . Nocturia more than twice per night 12/31/2015  . Pain in joints 12/31/2015  . ADD (attention deficit disorder) 11/25/2015  . Depression 11/25/2015  . Diverticulosis of colon 11/25/2015  . Essential hypertension 11/25/2015  . Hypercholesterolemia 11/25/2015  . Primary osteoarthritis of right foot 11/14/2015  . Family history of colon cancer in elderly mother 05/29/2015  . Varicose veins of lower extremities with ulcer ([place]) 11/16/2012  . History of colonic polyps 02/04/2011     PHYSICAL THERAPY DISCHARGE SUMMARY  Visits from Start of Care: 11  Current functional level related to goals / functional outcomes: See above for progress towards goals; majority of LTG's achieved   Remaining deficits: Mild gait deviations but much improved gait pattern with pt having shoe lift on RLE and wearing air cast on LLE for increased ankle stability (& safety) to reduce ankle supination in swing/stance phase of gait Cont. Decreased high level balance skills   Education / Equipment: Pt has been instructed in HEP for RLE stengthening and stretching and also for balance training Plan: Patient agrees to discharge.  Patient goals were met. Patient is being discharged due to meeting the stated rehab goals.  ?????       Alda Lea, PT 10/08/2020, 7:36 PM  [facility] [address] [place], [place], [address] Phone: [phone]   Fax:  [phone]  Name: Trevor [name] MRN: [id number] Date of Birth: 05-Oct-1950

## 2020-10-10 DIAGNOSIS — Z23 Encounter for immunization: Secondary | ICD-10-CM | POA: Diagnosis not present

## 2020-10-10 DIAGNOSIS — R3915 Urgency of urination: Secondary | ICD-10-CM | POA: Diagnosis not present

## 2020-10-30 DIAGNOSIS — R3915 Urgency of urination: Secondary | ICD-10-CM | POA: Diagnosis not present

## 2020-11-04 ENCOUNTER — Ambulatory Visit: Payer: Medicare Other | Admitting: Vascular Surgery

## 2020-11-10 ENCOUNTER — Ambulatory Visit (INDEPENDENT_AMBULATORY_CARE_PROVIDER_SITE_OTHER): Payer: Medicare Other | Admitting: Podiatry

## 2020-11-10 ENCOUNTER — Other Ambulatory Visit: Payer: Self-pay

## 2020-11-10 VITALS — BP 133/78 | HR 75 | Temp 97.9°F

## 2020-11-10 DIAGNOSIS — L97511 Non-pressure chronic ulcer of other part of right foot limited to breakdown of skin: Secondary | ICD-10-CM

## 2020-11-10 DIAGNOSIS — M14679 Charcot's joint, unspecified ankle and foot: Secondary | ICD-10-CM | POA: Diagnosis not present

## 2020-11-10 MED ORDER — DOXYCYCLINE HYCLATE 100 MG PO TABS: 100.0000 mg | ORAL_TABLET | Freq: Two times a day (BID) | ORAL | 0 refills | Status: DC

## 2020-11-10 MED ORDER — MUPIROCIN 2 % EX OINT: 1.0000 "application " | TOPICAL_OINTMENT | Freq: Two times a day (BID) | CUTANEOUS | 2 refills | Status: DC

## 2020-11-10 NOTE — Progress Notes (Signed)
Subjective:   Patient ID: Trevor [name], male   DOB: 70 y.o.   MRN: [id number]   HPI 71 year old male presents the office with concerns of callus on the bottom of his right foot.  He does have history of Charcot.  He has been in the [employer] he recently just saw his doctor at [facility] and he was given the clearance to back into his diabetic shoe, insert.  This combined with regular shoe it seems the calluses have worsened some.  Denies any drainage or pus or swelling.  No redness or warmth.  He has no other concerns today.  Denies any fevers, chills, nausea, vomiting.  No calf pain, chest pain or shortness of breath.   Review of Systems  All other systems reviewed and are negative.  Past Medical History:  Diagnosis Date  . Acute pain of left shoulder 04/04/2017  . ADD (attention deficit disorder)   . Allergy   . Anxiety   . Arthritis    right foot  . Arthritis of midfoot 04/29/2016   Overview:  Added automatically from request for surgery [id number]  . Blood transfusion without reported diagnosis   . Cataract    removed both eyes with lens implants   . Chest wall pain 09/29/2017  . Chronic pain of right ankle 04/08/2016  . Cough due to ACE inhibitor 03/18/2017  . Depression   . Descending thoracic aortic aneurysm ([place]) 04/04/2017   Overview:  4.5 cm on CT scan of April 04, 2017.  . Diverticulosis of colon 11/25/2015  . Dyspnea on exertion 04/04/2017  . Essential hypertension 11/25/2015  . Executive function deficit 12/31/2015  . Family history of colon cancer in mother    dx'd age late 75's  . Foot pain, right 04/08/2016  . Gait abnormality 03/29/2019  . H/O blood clots   . Health maintenance examination 09/29/2017  . History of colonic polyps 02/04/2011   3 10 mm hyperplastic polyps removed from right and left colon 2004 4 polyps, one 10 mm others diminutive and hyperplastic 2007 Followed closer than routine risk due to multiple large hyperplastic polyps 5 adenomas with one of them serrated   05/29/2015 11 polyps max 10 mm adenomas, ssa's, hyperplastic - 09/21/2017 3 diminutive polyps removed adenomas - recall 2021 (2 yrs)     . Hypercholesterolemia 11/25/2015  . Hyperlipidemia   . Hypertension   . Left foot pain 01/27/2016  . Nocturia more than twice per night 12/31/2015  . Pain in joints 12/31/2015  . Pectoralis major tendinitis, right 03/18/2017  . Pericarditis   . Peripheral neuropathy 04/24/2018  . Pneumonia   . Primary osteoarthritis of right foot 11/14/2015  . Shortness of breath 09/29/2017  . Status post right foot surgery 06/23/2016  . Strain of right pectoralis muscle 09/07/2016  . Thoracic aortic aneurysm ([place]) 06/08/2017  . Traumatic brain injury ([place])    1976  . Ulcer   . Varicose veins   . Varicose veins of lower extremities with ulcer ([place]) 11/16/2012    Past Surgical History:  Procedure Laterality Date  . BURR HOLE OF CRANIUM  04/10/75  . COLONOSCOPY    . COLONOSCOPY W/ POLYPECTOMY  2004, 2007, 02/03/11   multiple large hyperplastic polyps 04/07, adenomas and one serrated 2012  . ENDOVENOUS ABLATION SAPHENOUS VEIN W/ LASER Left 09-02-2010   EVLA left great and small saphenous vein by Curt Jews MD   . FOOT FUSION Right    [place]  . neck fusion    .  POLYPECTOMY    . ROTATOR CUFF REPAIR Right 2019  . rt. knee replacement    . THORACIC AORTIC ENDOVASCULAR STENT GRAFT N/A 06/08/2017   Procedure: THORACIC AORTIC ENDOVASCULAR STENT GRAFT;  Surgeon: Rosetta Posner, MD;  Location: [facility] OR;  Service: Vascular;  Laterality: N/A;     Current Outpatient Medications:  .  doxycycline (VIBRA-TABS) 100 MG tablet, Take 1 tablet (100 mg total) by mouth 2 (two) times daily., Disp: 20 tablet, Rfl: 0 .  amLODipine (NORVASC) 10 MG tablet, , Disp: , Rfl:  .  amphetamine-dextroamphetamine (ADDERALL) 30 MG tablet, Take 30 mg by mouth 2 (two) times daily., Disp: , Rfl:  .  escitalopram (LEXAPRO) 10 MG tablet, Take 10 mg by mouth at bedtime., Disp: , Rfl:  .  escitalopram  (LEXAPRO) 20 MG tablet, Take 20 mg by mouth daily., Disp: , Rfl:  .  lovastatin (MEVACOR) 20 MG tablet, Take 40 mg by mouth daily. , Disp: , Rfl:  .  mirabegron ER (MYRBETRIQ) 50 MG TB24 tablet, Take 50 mg by mouth daily. (Patient not taking: Reported on 07/29/2020), Disp: , Rfl:  .  moxifloxacin (VIGAMOX) 0.5 % ophthalmic solution, PLEASE SEE ATTACHED FOR DETAILED DIRECTIONS, Disp: , Rfl:  .  prednisoLONE acetate (PRED FORTE) 1 % ophthalmic suspension, Place 1 drop into the right eye 4 (four) times daily. (Patient not taking: Reported on 07/29/2020), Disp: , Rfl:  .  tamsulosin (FLOMAX) 0.4 MG CAPS capsule, Take 0.4 mg by mouth daily., Disp: , Rfl:  .  telmisartan-hydrochlorothiazide (MICARDIS HCT) 80-12.5 MG tablet, Take 1 tablet by mouth daily., Disp: , Rfl:  .  traMADol (ULTRAM) 50 MG tablet, Take 50 mg by mouth 3 (three) times daily as needed for moderate pain., Disp: , Rfl:   Current Facility-Administered Medications:  .  0.9 %  sodium chloride infusion, 500 mL, Intravenous, Once, Gatha Mayer, MD  Allergies  Allergen Reactions  . Terazosin Other (See Comments)    dizziness dizziness         Objective:  Physical Exam  General: AAO x3, NAD  Dermatological: Hyperkeratotic lesions plantar hallux right foot as well as submetatarsal 1.  Upon debridement there is superficial area skin breakdown.  No purulence identified today.  There is no surrounding erythema, ascending cellulitis there is no fluctuation crepitation but there is no malodor.      Vascular: Dorsalis Pedis artery and Posterior Tibial artery pedal pulses are palpable bilateral with immedate capillary fill time. There is no pain with calf compression, swelling, warmth, erythema.   Neruologic: Sensation decreased with Thornell Mule monofilament  Musculoskeletal: Prominence of metatarsal head plantarly.  Muscular strength 5/5 in all groups tested bilateral.     Assessment:   Superficial wounds right foot,  callus    Plan:  -Treatment options discussed including all alternatives, risks, and complications -Etiology of symptoms were discussed -I debrided hyperkeratotic lesions today to reveal underlying superficial wounds.  This seems to be getting worse since going back into a shoe with insert.  I debrided them to healthy tissue today.  Recommended mupirocin ointment Prescribed as well as doxycycline orally.  Prescription was given for biotech to see if they help offload.  Discussed come back into the CROW boot patient states it is no longer fitting.  He has a cam boot at home that I would wear until the inserts can get adjusted.  Monitor closely for any signs or symptoms of infection.  I will see him back in 2 weeks or  sooner if needed.  Trula Slade DPM  Cc: Dr. Shon Baton, MD ([place])

## 2020-11-11 ENCOUNTER — Ambulatory Visit: Payer: Medicare Other | Admitting: Vascular Surgery

## 2020-11-18 ENCOUNTER — Other Ambulatory Visit: Payer: Self-pay

## 2020-11-18 ENCOUNTER — Ambulatory Visit (INDEPENDENT_AMBULATORY_CARE_PROVIDER_SITE_OTHER): Payer: Medicare Other | Admitting: Podiatry

## 2020-11-18 DIAGNOSIS — L97511 Non-pressure chronic ulcer of other part of right foot limited to breakdown of skin: Secondary | ICD-10-CM

## 2020-11-18 DIAGNOSIS — M14679 Charcot's joint, unspecified ankle and foot: Secondary | ICD-10-CM

## 2020-11-23 NOTE — Progress Notes (Signed)
Subjective: 70 year old male presents the office today for follow-up evaluation of wounds on his right foot.  He states he had some discomfort to the area times but denies any redness or drainage or any swelling.  He has been applying ointment to the area daily.  No signs of infection reports. Denies any systemic complaints such as fevers, chills, nausea, vomiting. No acute changes since last appointment, and no other complaints at this time.   Objective: AAO x3, NAD- wearing CAM boot on the right DP/PT pulses palpable bilaterally, CRT less than 3 seconds On the right foot plantar submetatarsal 1, hallux is hyperkeratotic tissue with central superficial granular wound.  The peroneal is healed but still somewhat open.  There is no probing, undermining or tunneling.  There is no surrounding erythema, ascending cellulitis.  There is no fluctuation or crepitation.  There is no malodor today.  No obvious signs of infection. No pain with calf compression, swelling, warmth, erythema  Assessment: Healing ulcerations right foot, Charcot  Plan: -All treatment options discussed with the patient including all alternatives, risks, complications.  -Sharply debrided the hyperkeratotic lesions without any complications to remove nonviable devitalized tissues around the ulcers.  He tolerated well.  No signs of infection today.  Continue dressing changes.  Continue offloading. -Monitor for any clinical signs or symptoms of infection and directed to call the office immediately should any occur or go to the ER. -Patient encouraged to call the office with any questions, concerns, change in symptoms.   Trula Slade DPM

## 2020-11-24 ENCOUNTER — Ambulatory Visit: Payer: Medicare Other | Admitting: Podiatry

## 2020-12-02 ENCOUNTER — Ambulatory Visit: Payer: Medicare Other | Admitting: Podiatry

## 2020-12-04 DIAGNOSIS — R3915 Urgency of urination: Secondary | ICD-10-CM | POA: Diagnosis not present

## 2020-12-10 ENCOUNTER — Other Ambulatory Visit: Payer: Self-pay | Admitting: Podiatry

## 2020-12-11 ENCOUNTER — Ambulatory Visit (INDEPENDENT_AMBULATORY_CARE_PROVIDER_SITE_OTHER): Payer: Medicare Other | Admitting: Podiatry

## 2020-12-11 ENCOUNTER — Other Ambulatory Visit: Payer: Self-pay

## 2020-12-11 DIAGNOSIS — L97511 Non-pressure chronic ulcer of other part of right foot limited to breakdown of skin: Secondary | ICD-10-CM | POA: Diagnosis not present

## 2020-12-11 NOTE — Progress Notes (Signed)
Subjective: 70 year old male presents the office today for follow-up evaluation of wounds on his right foot.  Denies any redness or drainage or any swelling.  He has no pain.  He has no other concerns today. Denies any systemic complaints such as fevers, chills, nausea, vomiting. No acute changes since last appointment, and no other complaints at this time.   Objective: AAO x3, NAD- wearing CAM boot on the right DP/PT pulses palpable bilaterally, CRT less than 3 seconds On the right foot plantar submetatarsal 1, hallux is hyperkeratotic tissue with central superficial granular wound on the plantar hallux.  Submetatarsal 1 the wound is healed.  There is no surrounding erythema, ascending cellulitis.  No drainage or possibly obvious signs of infection.  No fluctuation or crepitation.  There is no malodor.  No pain with calf compression, swelling, warmth, erythema  Assessment: Healing ulcerations right foot, Charcot  Plan: -All treatment options discussed with the patient including all alternatives, risks, complications.  -Sharply debrided the hyperkeratotic lesions without any complications to remove nonviable devitalized tissues around the ulcer on the plantar hallux he tolerated well.  No signs of infection today.  Continue dressing changes.  Continue offloading. -Submetatarsal 1 ulceration is healed -Monitor for any clinical signs or symptoms of infection and directed to call the office immediately should any occur or go to the ER. -Patient encouraged to call the office with any questions, concerns, change in symptoms.   Trula Slade DPM

## 2020-12-16 DIAGNOSIS — H26493 Other secondary cataract, bilateral: Secondary | ICD-10-CM | POA: Diagnosis not present

## 2020-12-16 DIAGNOSIS — Z961 Presence of intraocular lens: Secondary | ICD-10-CM | POA: Diagnosis not present

## 2020-12-16 DIAGNOSIS — H524 Presbyopia: Secondary | ICD-10-CM | POA: Diagnosis not present

## 2020-12-18 DIAGNOSIS — R3915 Urgency of urination: Secondary | ICD-10-CM | POA: Diagnosis not present

## 2020-12-23 ENCOUNTER — Ambulatory Visit (INDEPENDENT_AMBULATORY_CARE_PROVIDER_SITE_OTHER): Payer: Medicare Other | Admitting: Podiatry

## 2020-12-23 ENCOUNTER — Other Ambulatory Visit: Payer: Self-pay

## 2020-12-23 DIAGNOSIS — L97511 Non-pressure chronic ulcer of other part of right foot limited to breakdown of skin: Secondary | ICD-10-CM

## 2020-12-26 DIAGNOSIS — M14671 Charcot's joint, right ankle and foot: Secondary | ICD-10-CM | POA: Diagnosis not present

## 2020-12-26 DIAGNOSIS — M7731 Calcaneal spur, right foot: Secondary | ICD-10-CM | POA: Diagnosis not present

## 2020-12-26 DIAGNOSIS — M19071 Primary osteoarthritis, right ankle and foot: Secondary | ICD-10-CM | POA: Diagnosis not present

## 2020-12-28 NOTE — Progress Notes (Signed)
  Subjective:  Patient ID: Trevor [name], male    DOB: Oct 01, 1950,  MRN: [id number]  Chief Complaint  Patient presents with  . Foot Ulcer    2 week follow up right foot    70 y.o. male presents with the above complaint. History confirmed with patient.  Objective:  Physical Exam: warm, good capillary refill and normal DP and PT pulses.  Right foot 7 1 callus ulcer remains healed with overlying hyperkeratosis, no underlying ulcer,, no signs infection or drainage  Assessment:  No diagnosis found.   Plan:  Patient was evaluated and treated and all questions answered.  He is doing quite well the ulcer remains healed and he should continue regular bathing and applying a urea-based or other moisturizing cream to the lesion and keep it soft.  I think should be checked 1 more time in 1 month to ensure that it remains healed and treatment any hyperkeratosis at that time  Return in about 1 month (around 01/22/2021) for with Dr Trevor [name] check callus/ulcers right foot .

## 2021-01-13 DIAGNOSIS — M14671 Charcot's joint, right ankle and foot: Secondary | ICD-10-CM | POA: Diagnosis not present

## 2021-01-13 DIAGNOSIS — M7731 Calcaneal spur, right foot: Secondary | ICD-10-CM | POA: Diagnosis not present

## 2021-01-22 ENCOUNTER — Other Ambulatory Visit: Payer: Self-pay

## 2021-01-22 ENCOUNTER — Ambulatory Visit (INDEPENDENT_AMBULATORY_CARE_PROVIDER_SITE_OTHER): Payer: Medicare Other | Admitting: Podiatry

## 2021-01-22 DIAGNOSIS — L97511 Non-pressure chronic ulcer of other part of right foot limited to breakdown of skin: Secondary | ICD-10-CM

## 2021-01-22 DIAGNOSIS — L84 Corns and callosities: Secondary | ICD-10-CM

## 2021-01-22 DIAGNOSIS — M14679 Charcot's joint, unspecified ankle and foot: Secondary | ICD-10-CM | POA: Diagnosis not present

## 2021-01-23 DIAGNOSIS — M14671 Charcot's joint, right ankle and foot: Secondary | ICD-10-CM | POA: Diagnosis not present

## 2021-01-27 NOTE — Progress Notes (Signed)
Subjective: 70 year old male presents the office today for follow-up evaluation of wounds on his right foot.  States that been healing well.  He is in a cam boot.  He still follows up with his orthopedic surgeon at [facility] and he has an MRI scheduled for his foot given the Charcot.  He presents today for evaluation of the wounds, calluses.  Denies any new ulcerations.  No increased swelling or redness.  He has no other concerns.   Objective: AAO x3, NAD- wearing CAM boot on the right DP/PT pulses palpable bilaterally, CRT less than 3 seconds On the right foot plantar submetatarsal 1, hallux is hyperkeratotic tissue and upon debridement there is no underlying ulceration drainage or any signs of infection the areas are preulcerative but there is no ulcerations open today. No pain with calf compression, swelling, warmth, erythema  Assessment: Healed ulcerations right foot, Charcot  Plan: -All treatment options discussed with the patient including all alternatives, risks, complications.  -Sharply debrided the hyperkeratotic lesions without any complications to reveal the underlying ulcerations of healed.  Continue offloading. -Follow-up with orthopedics as scheduled  Trula Slade DPM

## 2021-03-02 ENCOUNTER — Other Ambulatory Visit: Payer: Self-pay

## 2021-03-02 ENCOUNTER — Ambulatory Visit: Payer: Medicare Other | Attending: Orthopedic Surgery | Admitting: Physical Therapy

## 2021-03-02 DIAGNOSIS — R29898 Other symptoms and signs involving the musculoskeletal system: Secondary | ICD-10-CM | POA: Diagnosis not present

## 2021-03-02 DIAGNOSIS — R2689 Other abnormalities of gait and mobility: Secondary | ICD-10-CM | POA: Diagnosis not present

## 2021-03-02 DIAGNOSIS — M6281 Muscle weakness (generalized): Secondary | ICD-10-CM

## 2021-03-02 DIAGNOSIS — R2681 Unsteadiness on feet: Secondary | ICD-10-CM | POA: Diagnosis not present

## 2021-03-03 ENCOUNTER — Encounter: Payer: Self-pay | Admitting: Physical Therapy

## 2021-03-03 NOTE — Therapy (Signed)
[facility] [address] [place], [place], [address] Phone: [phone]   Fax:  [phone]  Physical Therapy Evaluation  Patient Details  Name: Trevor [name] MRN: [id number] Date of Birth: 02-Dec-1950 Referring Provider (PT): [place] Cellar, MD   Encounter Date: 03/02/2021   PT End of Session - 03/03/21 0933     Visit Number 1    Number of Visits 7   eval + 6 visits   Date for PT Re-Evaluation 04/17/21    Authorization Type Medicare    Authorization Time Period 03-03-21 - 05-03-21    PT Start Time 0935    PT Stop Time 1016    PT Time Calculation (min) 41 min    Activity Tolerance Patient tolerated treatment well    Behavior During Therapy [facility] for tasks assessed/performed             Past Medical History:  Diagnosis Date   Acute pain of left shoulder 04/04/2017   ADD (attention deficit disorder)    Allergy    Anxiety    Arthritis    right foot   Arthritis of midfoot 04/29/2016   Overview:  Added automatically from request for surgery [id number]   Blood transfusion without reported diagnosis    Cataract    removed both eyes with lens implants    Chest wall pain 09/29/2017   Chronic pain of right ankle 04/08/2016   Cough due to ACE inhibitor 03/18/2017   Depression    Descending thoracic aortic aneurysm ([place]) 04/04/2017   Overview:  4.5 cm on CT scan of April 04, 2017.   Diverticulosis of colon 11/25/2015   Dyspnea on exertion 04/04/2017   Essential hypertension 11/25/2015   Executive function deficit 12/31/2015   Family history of colon cancer in mother    dx'd age late 39's   Foot pain, right 04/08/2016   Gait abnormality 03/29/2019   H/O blood clots    Health maintenance examination 09/29/2017   History of colonic polyps 02/04/2011   3 10 mm hyperplastic polyps removed from right and left colon 2004 4 polyps, one 10 mm others diminutive and hyperplastic 2007 Followed closer than routine risk due to multiple large  hyperplastic polyps 5 adenomas with one of them serrated  05/29/2015 11 polyps max 10 mm adenomas, ssa's, hyperplastic - 09/21/2017 3 diminutive polyps removed adenomas - recall 2021 (2 yrs)      Hypercholesterolemia 11/25/2015   Hyperlipidemia    Hypertension    Left foot pain 01/27/2016   Nocturia more than twice per night 12/31/2015   Pain in joints 12/31/2015   Pectoralis major tendinitis, right 03/18/2017   Pericarditis    Peripheral neuropathy 04/24/2018   Pneumonia    Primary osteoarthritis of right foot 11/14/2015   Shortness of breath 09/29/2017   Status post right foot surgery 06/23/2016   Strain of right pectoralis muscle 09/07/2016   Thoracic aortic aneurysm ([place]) 06/08/2017   Traumatic brain injury ([place])    1976   Ulcer    Varicose veins    Varicose veins of lower extremities with ulcer ([place]) 11/16/2012    Past Surgical History:  Procedure Laterality Date   BURR HOLE OF CRANIUM  04/10/75   COLONOSCOPY     COLONOSCOPY W/ POLYPECTOMY  2004, 2007, 02/03/11   multiple large hyperplastic polyps 04/07, adenomas and one serrated 2012   ENDOVENOUS ABLATION SAPHENOUS VEIN W/ LASER Left 09-02-2010   EVLA left great and small saphenous vein by Curt Jews  MD    FOOT FUSION Right    [place]   neck fusion     POLYPECTOMY     ROTATOR CUFF REPAIR Right 2019   rt. knee replacement     THORACIC AORTIC ENDOVASCULAR STENT GRAFT N/A 06/08/2017   Procedure: THORACIC AORTIC ENDOVASCULAR STENT GRAFT;  Surgeon: Rosetta Posner, MD;  Location: [facility] OR;  Service: Vascular;  Laterality: N/A;    There were no vitals filed for this visit.    Subjective Assessment - 03/02/21 0942     Subjective Pt states he saw Dr. [place] Cellar in May and he referred him to PT for Rt Achilles tendon stretching; is still wearing air cast on LLE and shoe with lift on RLE    Patient Stated Goals increase ROM in Rt ankle; increase strength in RLE    Currently in Pain? No/denies                [facility] PT  Assessment - 03/03/21 0001       Assessment   Medical Diagnosis Charcot's joint of Rt foot    Referring Provider (PT) [place] Cellar, MD    Onset Date/Surgical Date --   July 2021:  Referral date 01-13-21   Prior Therapy Pt had OP PT at this facility from 02-19-19 - 04-23-19 (9 visits) and again from 07-29-20 - 10-07-20 (11 visits)  for same diagnosis      Precautions   Precautions None      Balance Screen   Has the patient fallen in the past 6 months No    Has the patient had a decrease in activity level because of a fear of falling?  No    Is the patient reluctant to leave their home because of a fear of falling?  No      Home Environment   Living Environment Private residence    Living Arrangements Spouse/significant other    Type of [place] Access Level entry    [place] Two level    Alternate Level Stairs-Rails Right      Prior Function   Level of Independence Independent with basic ADLs;Independent with household mobility without device;Independent with community mobility without device;Independent with transfers      ROM / Strength   AROM / PROM / Strength Strength;AROM      AROM   Overall AROM  Deficits    AROM Assessment Site Ankle    Right/Left Ankle Right    Right Ankle Dorsiflexion -8   passively +4 degrees   Right Ankle Plantar Flexion 21    Right Ankle Inversion 0    Right Ankle Eversion 0      Strength   Overall Strength Deficits    Strength Assessment Site Ankle    Right/Left Ankle Right    Right Ankle Dorsiflexion 4/5   in available range   Right Ankle Plantar Flexion 2-/5    Right Ankle Inversion 0/5   due to fusion   Right Ankle Eversion 0/5   due to fusion     Transfers   Transfers Sit to Stand;Stand to Sit    Sit to Stand 6: Modified independent (Device/Increase time);With armrests    Five time sit to stand comments  16.88 secs without UE support from standard chair    Stand to Sit 6: Modified independent (Device/Increase time)     Comments able to perform sit to stand from mat without UE support with rocking motion for momentum  and after several attempts      Ambulation/Gait   Ambulation/Gait Yes    Ambulation/Gait Assistance 6: Modified independent (Device/Increase time)    Ambulation Distance (Feet) 100 Feet    Assistive device None    Gait Pattern Step-through pattern    Ambulation Surface Level;Indoor    Gait velocity 14.38 secs = 2.28 ft/sec    Stairs Yes    Stairs Assistance 6: Modified independent (Device/Increase time)    Stair Management Technique One rail Right;Forwards;Alternating pattern;Step to pattern   alternating with ascension;  step to with descension   Number of Stairs 4    Height of Stairs 6    Gait Comments pt wearing shoe with lift on RLE and air cast worn on LLE      Standardized Balance Assessment   Standardized Balance Assessment Timed Up and Go Test      Timed Up and Go Test   Normal TUG (seconds) 14.54                        Objective measurements completed on examination: See above findings.               PT Education - 03/03/21 [phone]     Education Details reviewed results of eval with pt and POC;  reviewed heel cord stretch exercise as instructed in previous admission    Person(s) Educated Patient    Methods Explanation    Comprehension Verbalized understanding              PT Short Term Goals - 03/03/21 0947       PT SHORT TERM GOAL #1   Title Independent in HEP for Rt ankle strengthening.    Time 3    Period Weeks    Status New    Target Date 03/27/21      PT SHORT TERM GOAL #2   Title Improve TUG score from 14.54 secs with no device to </= 13.0 secs for reduced fall risk.    Baseline 14.54 secs without device    Time 3    Period Weeks    Status New    Target Date 03/27/21      PT SHORT TERM GOAL #3   Title Increase Rt ankle dorsiflexion from -8 degrees actively to </= -4 degrees actively.    Baseline -8 degrees    Time 3     Period Weeks    Status New    Target Date 03/27/21               PT Long Term Goals - 03/03/21 0949       PT LONG TERM GOAL #1   Title Increase TUG score from 14.54 secs to </= 12.5 secs without device for reduced fall risk.    Baseline 14.54 secs without device    Time 6    Period Weeks    Status New    Target Date 04/17/21      PT LONG TERM GOAL #2   Title Increase gait velocity from 2.28 ft/sec to >/= 2.8 ft/sec without device for increased gait effiiciency.    Baseline 14.38 secs = 2.28 ft/sec    Time 6    Period Weeks    Status New    Target Date 04/17/21      PT LONG TERM GOAL #3   Title Increase Rt ankle dorsiflexion to >/= -2 degrees actively and to >/= 10 degrees passively for improved gait  pattern.    Baseline -8 degrees actively; +4 degrees passively    Time 6    Period Weeks    Status New    Target Date 04/17/21      PT LONG TERM GOAL #4   Title Independent in updated HEP for Rt ankle strengthening & stretching.    Time 6    Period Weeks    Status New    Target Date 04/17/21                    Plan - 03/03/21 0935     Clinical Impression Statement Pt presents with Charcot's joint Rt foot with decreased ankle AROM and strength in Rt plantarflexors; pt has no active or passive Rt ankle inversion or eversion due to arthrodesis of Rt ankle joint.  Pt has been transitioned out of CROW boot to regular shoe with custom orthotics in July 2021. Pt obtained shoe lift on shoe in Dec. 2021 and obtained air cast for LLE in Jan. 2022; this significantly reduced gait deviations and improved gait pattern.  Pt presents with decreased active and passive dorsiflexion RLE.  Pt will benefit from PT to address Rt ankle ROM,  strengthening, gait and balance.    Personal Factors and Comorbidities Comorbidity 2;Time since onset of injury/illness/exacerbation;Past/Current Experience    Comorbidities h/o Rt foot fusion    Examination-Activity Limitations  Squat;Transfers;Locomotion Level;Stand;Stairs    Examination-Participation Restrictions Shop;Meal Prep;Yard Work;Cleaning;Community Activity    Stability/Clinical Decision Making Stable/Uncomplicated    Clinical Decision Making Low    Rehab Potential Good    PT Frequency 1x / week    PT Duration 6 weeks   + eval   PT Treatment/Interventions ADLs/Self Care Home Management;Balance training;Neuromuscular re-education;Gait training;Stair training;Patient/family education;Therapeutic activities;Therapeutic exercise;Passive range of motion    PT Next Visit Plan HEP for Rt heel cord stretching; Rt ankle strengthening exercises    Consulted and Agree with Plan of Care Patient             Patient will benefit from skilled therapeutic intervention in order to improve the following deficits and impairments:  Difficulty walking, Decreased balance, Decreased range of motion, Decreased strength, Impaired flexibility  Visit Diagnosis: Other abnormalities of gait and mobility - Plan: PT plan of care cert/re-cert  Muscle weakness (generalized) - Plan: PT plan of care cert/re-cert  Unsteadiness on feet - Plan: PT plan of care cert/re-cert  Other symptoms and signs involving the musculoskeletal system - Plan: PT plan of care cert/re-cert     Problem List Patient Active Problem List   Diagnosis Date Noted   Gait abnormality 03/29/2019   Peripheral neuropathy 04/24/2018   S/P rotator cuff repair 01/31/2018   Nontraumatic tear of right supraspinatus tendon 01/19/2018   Brachial neuritis or radiculitis 11/24/2017   Chest wall pain 09/29/2017   Shortness of breath 09/29/2017   Health maintenance examination 09/29/2017   Thoracic aortic aneurysm ([place]) 06/08/2017   Acute pain of left shoulder 04/04/2017   Descending thoracic aortic aneurysm ([place]) 04/04/2017   Dyspnea on exertion 04/04/2017   Cough due to ACE inhibitor 03/18/2017   Pectoralis major tendinitis, right 03/18/2017   Strain of right  pectoralis muscle 09/07/2016   Status post right foot surgery 06/23/2016   Arthritis of midfoot 04/29/2016   Chronic pain of right ankle 04/08/2016   Foot pain, right 04/08/2016   Left foot pain 01/27/2016   Executive function deficit 12/31/2015   Nocturia more than twice per night 12/31/2015  Pain in joints 12/31/2015   ADD (attention deficit disorder) 11/25/2015   Depression 11/25/2015   Diverticulosis of colon 11/25/2015   Essential hypertension 11/25/2015   Hypercholesterolemia 11/25/2015   Primary osteoarthritis of right foot 11/14/2015   Family history of colon cancer in elderly mother 05/29/2015   Varicose veins of lower extremities with ulcer ([place]) 11/16/2012   History of colonic polyps 02/04/2011    Alda Lea, PT 03/03/2021, 10:14 AM  [facility] [address] [place], [place], [address] Phone: [phone]   Fax:  [phone]  Name: Trevor [name] MRN: [id number] Date of Birth: 1950-11-14

## 2021-03-05 ENCOUNTER — Ambulatory Visit: Payer: Medicare Other

## 2021-03-05 ENCOUNTER — Ambulatory Visit (INDEPENDENT_AMBULATORY_CARE_PROVIDER_SITE_OTHER): Payer: Medicare Other | Admitting: Podiatry

## 2021-03-05 ENCOUNTER — Other Ambulatory Visit: Payer: Self-pay

## 2021-03-05 DIAGNOSIS — G629 Polyneuropathy, unspecified: Secondary | ICD-10-CM

## 2021-03-05 DIAGNOSIS — L84 Corns and callosities: Secondary | ICD-10-CM

## 2021-03-05 DIAGNOSIS — S9031XD Contusion of right foot, subsequent encounter: Secondary | ICD-10-CM

## 2021-03-05 DIAGNOSIS — M14679 Charcot's joint, unspecified ankle and foot: Secondary | ICD-10-CM | POA: Diagnosis not present

## 2021-03-08 NOTE — Progress Notes (Signed)
Subjective: 70 year old male presents the office today for follow-up evaluation of pre-ulcerative calluses on his right foot.  States that been healing well and denies any opening, drainage. No increase in swelling or warmth. He follows at [facility] for the Charcot.   Objective: AAO x3, NAD DP/PT pulses palpable bilaterally, CRT less than 3 seconds On the right foot plantar submetatarsal 1, hallux is hyperkeratotic tissue and upon debridement there is no underlying ulceration drainage or any signs of infection.  Charcot changes noted  No pain with calf compression, swelling, warmth, erythema  Assessment: Healed ulcerations right foot, Charcot  Plan: -All treatment options discussed with the patient including all alternatives, risks, complications.  -Sharply debrided the hyperkeratotic lesions without any complications x2 to reveal the underlying ulcerations of healed.  Continue offloading. -Follow-up with orthopedics as scheduled  Trula Slade DPM

## 2021-03-10 ENCOUNTER — Ambulatory Visit: Payer: Medicare Other | Admitting: Physical Therapy

## 2021-03-10 DIAGNOSIS — M19071 Primary osteoarthritis, right ankle and foot: Secondary | ICD-10-CM | POA: Diagnosis not present

## 2021-03-10 DIAGNOSIS — M14671 Charcot's joint, right ankle and foot: Secondary | ICD-10-CM | POA: Diagnosis not present

## 2021-03-10 DIAGNOSIS — M7731 Calcaneal spur, right foot: Secondary | ICD-10-CM | POA: Diagnosis not present

## 2021-03-10 DIAGNOSIS — Z9889 Other specified postprocedural states: Secondary | ICD-10-CM | POA: Diagnosis not present

## 2021-03-17 ENCOUNTER — Other Ambulatory Visit: Payer: Self-pay

## 2021-03-17 ENCOUNTER — Ambulatory Visit: Payer: Medicare Other | Attending: Orthopedic Surgery | Admitting: Physical Therapy

## 2021-03-17 DIAGNOSIS — R2681 Unsteadiness on feet: Secondary | ICD-10-CM | POA: Insufficient documentation

## 2021-03-17 DIAGNOSIS — M6281 Muscle weakness (generalized): Secondary | ICD-10-CM | POA: Insufficient documentation

## 2021-03-17 DIAGNOSIS — R2689 Other abnormalities of gait and mobility: Secondary | ICD-10-CM | POA: Insufficient documentation

## 2021-03-24 ENCOUNTER — Other Ambulatory Visit: Payer: Self-pay

## 2021-03-24 ENCOUNTER — Ambulatory Visit: Payer: Medicare Other | Admitting: Physical Therapy

## 2021-03-24 DIAGNOSIS — M6281 Muscle weakness (generalized): Secondary | ICD-10-CM

## 2021-03-24 DIAGNOSIS — R2689 Other abnormalities of gait and mobility: Secondary | ICD-10-CM | POA: Diagnosis not present

## 2021-03-24 DIAGNOSIS — R2681 Unsteadiness on feet: Secondary | ICD-10-CM | POA: Diagnosis not present

## 2021-03-25 NOTE — Therapy (Signed)
[facility] [address] [place], [place], [address] Phone: [phone]   Fax:  [phone]  Physical Therapy Treatment  Patient Details  Name: Trevor [name] MRN: [id number] Date of Birth: 29-Dec-1950 Referring Provider (PT): [place] Cellar, MD   Encounter Date: 03/24/2021   PT End of Session - 03/24/21 1520     Visit Number 2    Number of Visits 7   eval + 6 visits   Date for PT Re-Evaluation 04/17/21    Authorization Type Medicare    Authorization Time Period 03-03-21 - 05-03-21    PT Start Time 1450    PT Stop Time 1530    PT Time Calculation (min) 40 min    Activity Tolerance Patient tolerated treatment well    Behavior During Therapy [facility] for tasks assessed/performed             Past Medical History:  Diagnosis Date   Acute pain of left shoulder 04/04/2017   ADD (attention deficit disorder)    Allergy    Anxiety    Arthritis    right foot   Arthritis of midfoot 04/29/2016   Overview:  Added automatically from request for surgery [id number]   Blood transfusion without reported diagnosis    Cataract    removed both eyes with lens implants    Chest wall pain 09/29/2017   Chronic pain of right ankle 04/08/2016   Cough due to ACE inhibitor 03/18/2017   Depression    Descending thoracic aortic aneurysm ([place]) 04/04/2017   Overview:  4.5 cm on CT scan of April 04, 2017.   Diverticulosis of colon 11/25/2015   Dyspnea on exertion 04/04/2017   Essential hypertension 11/25/2015   Executive function deficit 12/31/2015   Family history of colon cancer in mother    dx'd age late 67's   Foot pain, right 04/08/2016   Gait abnormality 03/29/2019   H/O blood clots    Health maintenance examination 09/29/2017   History of colonic polyps 02/04/2011   3 10 mm hyperplastic polyps removed from right and left colon 2004 4 polyps, one 10 mm others diminutive and hyperplastic 2007 Followed closer than routine risk due to multiple large  hyperplastic polyps 5 adenomas with one of them serrated  05/29/2015 11 polyps max 10 mm adenomas, ssa's, hyperplastic - 09/21/2017 3 diminutive polyps removed adenomas - recall 2021 (2 yrs)      Hypercholesterolemia 11/25/2015   Hyperlipidemia    Hypertension    Left foot pain 01/27/2016   Nocturia more than twice per night 12/31/2015   Pain in joints 12/31/2015   Pectoralis major tendinitis, right 03/18/2017   Pericarditis    Peripheral neuropathy 04/24/2018   Pneumonia    Primary osteoarthritis of right foot 11/14/2015   Shortness of breath 09/29/2017   Status post right foot surgery 06/23/2016   Strain of right pectoralis muscle 09/07/2016   Thoracic aortic aneurysm ([place]) 06/08/2017   Traumatic brain injury ([place])    1976   Ulcer    Varicose veins    Varicose veins of lower extremities with ulcer ([place]) 11/16/2012    Past Surgical History:  Procedure Laterality Date   BURR HOLE OF CRANIUM  04/10/75   COLONOSCOPY     COLONOSCOPY W/ POLYPECTOMY  2004, 2007, 02/03/11   multiple large hyperplastic polyps 04/07, adenomas and one serrated 2012   ENDOVENOUS ABLATION SAPHENOUS VEIN W/ LASER Left 09-02-2010   EVLA left great and small saphenous vein by Curt Jews  MD    FOOT FUSION Right    [place]   neck fusion     POLYPECTOMY     ROTATOR CUFF REPAIR Right 2019   rt. knee replacement     THORACIC AORTIC ENDOVASCULAR STENT GRAFT N/A 06/08/2017   Procedure: THORACIC AORTIC ENDOVASCULAR STENT GRAFT;  Surgeon: Rosetta Posner, MD;  Location: [facility] OR;  Service: Vascular;  Laterality: N/A;    There were no vitals filed for this visit.   Subjective Assessment - 03/24/21 1455     Subjective Pt had office visit with Dr. Andree Elk on June 28 - states they took xrays of Rt foot - states everything is about the same; pt states he would not have surgery for this at this time    Patient Stated Goals increase ROM in Rt ankle; increase strength in RLE    Currently in Pain? No/denies                                [facility] Adult PT Treatment/Exercise - 03/25/21 0001       Transfers   Transfers Sit to Stand;Stand to Sit    Sit to Stand 5: Supervision    Stand to Sit 6: Modified independent (Device/Increase time)    Number of Reps Other reps (comment)   5   Comments used RUE support on mat; feet placed on Airex for incr. challenge with balance upon initial standing      Exercises   Exercises Knee/Hip;Ankle      Knee/Hip Exercises: Stretches   Active Hamstring Stretch Right;Left;1 rep;30 seconds   Runner's stretch on step   Gastroc Stretch Both;1 rep;30 seconds   heels off edge of step     Knee/Hip Exercises: Standing   Heel Raises Both;1 set;10 reps    Hip Flexion Stengthening;Right;1 set;10 reps   with red theraband   Hip Abduction Stengthening;Right;1 set;10 reps   with red theraband   Hip Extension Stengthening;Right;1 set;10 reps   with red theraband   Forward Step Up Right;1 set;10 reps;Hand Hold: 1;Step Height: 6"    Rocker Board 1 minute   2 sets 10 reps   Other Standing Knee Exercises Pt performed stepping forward/back from board to floor and then stepping from board backward to floor - 10 reps each foot each direction with UE support on // bars      Ankle Exercises: Seated   Toe Raise 10 reps   with red theraband for dorsiflexor strengthening   Other Seated Ankle Exercises Rt ankle inversion and eversion 10 reps each with red theraband                      PT Short Term Goals - 03/25/21 1445       PT SHORT TERM GOAL #1   Title Independent in HEP for Rt ankle strengthening.    Time 3    Period Weeks    Status New    Target Date 03/27/21      PT SHORT TERM GOAL #2   Title Improve TUG score from 14.54 secs with no device to </= 13.0 secs for reduced fall risk.    Baseline 14.54 secs without device    Time 3    Period Weeks    Status New    Target Date 03/27/21      PT SHORT TERM GOAL #3   Title Increase Rt ankle  dorsiflexion  from -8 degrees actively to </= -4 degrees actively.    Baseline -8 degrees    Time 3    Period Weeks    Status New    Target Date 03/27/21               PT Long Term Goals - 03/25/21 1445       PT LONG TERM GOAL #1   Title Increase TUG score from 14.54 secs to </= 12.5 secs without device for reduced fall risk.    Baseline 14.54 secs without device    Time 6    Period Weeks    Status New      PT LONG TERM GOAL #2   Title Increase gait velocity from 2.28 ft/sec to >/= 2.8 ft/sec without device for increased gait effiiciency.    Baseline 14.38 secs = 2.28 ft/sec    Time 6    Period Weeks    Status New      PT LONG TERM GOAL #3   Title Increase Rt ankle dorsiflexion to >/= -2 degrees actively and to >/= 10 degrees passively for improved gait pattern.    Baseline -8 degrees actively; +4 degrees passively    Time 6    Period Weeks    Status New      PT LONG TERM GOAL #4   Title Independent in updated HEP for Rt ankle strengthening & stretching.    Time 6    Period Weeks    Status New                   Plan - 03/25/21 1443     Clinical Impression Statement Session focused on Rt ankle strengthening and heel cord stretching.  Red theraband used for PRE's for Rt ankle musc.  Pt unable to plantarflex in standing due to weakness in Rt plantarflexors.  Cont with POC.    Personal Factors and Comorbidities Comorbidity 2;Time since onset of injury/illness/exacerbation;Past/Current Experience    Comorbidities h/o Rt foot fusion    Examination-Activity Limitations Squat;Transfers;Locomotion Level;Stand;Stairs    Examination-Participation Restrictions Shop;Meal Prep;Yard Work;Cleaning;Community Activity    Stability/Clinical Decision Making Stable/Uncomplicated    Rehab Potential Good    PT Frequency 1x / week    PT Duration 6 weeks   + eval   PT Treatment/Interventions ADLs/Self Care Home Management;Balance training;Neuromuscular re-education;Gait  training;Stair training;Patient/family education;Therapeutic activities;Therapeutic exercise;Passive range of motion    PT Next Visit Plan HEP for Rt heel cord stretching; Rt ankle strengthening exercises    Consulted and Agree with Plan of Care Patient             Patient will benefit from skilled therapeutic intervention in order to improve the following deficits and impairments:  Difficulty walking, Decreased balance, Decreased range of motion, Decreased strength, Impaired flexibility  Visit Diagnosis: Other abnormalities of gait and mobility  Muscle weakness (generalized)     Problem List Patient Active Problem List   Diagnosis Date Noted   Gait abnormality 03/29/2019   Peripheral neuropathy 04/24/2018   S/P rotator cuff repair 01/31/2018   Nontraumatic tear of right supraspinatus tendon 01/19/2018   Brachial neuritis or radiculitis 11/24/2017   Chest wall pain 09/29/2017   Shortness of breath 09/29/2017   Health maintenance examination 09/29/2017   Thoracic aortic aneurysm ([place]) 06/08/2017   Acute pain of left shoulder 04/04/2017   Descending thoracic aortic aneurysm ([place]) 04/04/2017   Dyspnea on exertion 04/04/2017   Cough due to ACE inhibitor 03/18/2017  Pectoralis major tendinitis, right 03/18/2017   Strain of right pectoralis muscle 09/07/2016   Status post right foot surgery 06/23/2016   Arthritis of midfoot 04/29/2016   Chronic pain of right ankle 04/08/2016   Foot pain, right 04/08/2016   Left foot pain 01/27/2016   Executive function deficit 12/31/2015   Nocturia more than twice per night 12/31/2015   Pain in joints 12/31/2015   ADD (attention deficit disorder) 11/25/2015   Depression 11/25/2015   Diverticulosis of colon 11/25/2015   Essential hypertension 11/25/2015   Hypercholesterolemia 11/25/2015   Primary osteoarthritis of right foot 11/14/2015   Family history of colon cancer in elderly mother 05/29/2015   Varicose veins of lower extremities  with ulcer ([place]) 11/16/2012   History of colonic polyps 02/04/2011    Alda Lea, PT 03/25/2021, 2:47 PM  [facility] [address] [place], [place], [address] Phone: [phone]   Fax:  [phone]  Name: MATAI CARPENITO MRN: [id number] Date of Birth: November 18, 1950

## 2021-03-31 ENCOUNTER — Other Ambulatory Visit: Payer: Self-pay

## 2021-03-31 ENCOUNTER — Ambulatory Visit: Payer: Medicare Other | Admitting: Physical Therapy

## 2021-03-31 DIAGNOSIS — R2689 Other abnormalities of gait and mobility: Secondary | ICD-10-CM | POA: Diagnosis not present

## 2021-03-31 DIAGNOSIS — M6281 Muscle weakness (generalized): Secondary | ICD-10-CM | POA: Diagnosis not present

## 2021-03-31 DIAGNOSIS — R2681 Unsteadiness on feet: Secondary | ICD-10-CM | POA: Diagnosis not present

## 2021-04-01 NOTE — Therapy (Signed)
[facility] [address] [place], [place], [address] Phone: [phone]   Fax:  [phone]  Physical Therapy Treatment  Patient Details  Name: Trevor [name] MRN: [id number] Date of Birth: 01-Feb-1951 Referring Provider (PT): Ellerbe Cellar, MD   Encounter Date: 03/31/2021   PT End of Session - 04/01/21 1535     Visit Number 3    Number of Visits 7   eval + 6 visits   Date for PT Re-Evaluation 04/17/21    Authorization Type Medicare    Authorization Time Period 03-03-21 - 05-03-21    PT Start Time 1320    PT Stop Time 1400    PT Time Calculation (min) 40 min    Activity Tolerance Patient tolerated treatment well    Behavior During Therapy [facility] for tasks assessed/performed             Past Medical History:  Diagnosis Date   Acute pain of left shoulder 04/04/2017   ADD (attention deficit disorder)    Allergy    Anxiety    Arthritis    right foot   Arthritis of midfoot 04/29/2016   Overview:  Added automatically from request for surgery [id number]   Blood transfusion without reported diagnosis    Cataract    removed both eyes with lens implants    Chest wall pain 09/29/2017   Chronic pain of right ankle 04/08/2016   Cough due to ACE inhibitor 03/18/2017   Depression    Descending thoracic aortic aneurysm ([place]) 04/04/2017   Overview:  4.5 cm on CT scan of April 04, 2017.   Diverticulosis of colon 11/25/2015   Dyspnea on exertion 04/04/2017   Essential hypertension 11/25/2015   Executive function deficit 12/31/2015   Family history of colon cancer in mother    dx'd age late 95's   Foot pain, right 04/08/2016   Gait abnormality 03/29/2019   H/O blood clots    Health maintenance examination 09/29/2017   History of colonic polyps 02/04/2011   3 10 mm hyperplastic polyps removed from right and left colon 2004 4 polyps, one 10 mm others diminutive and hyperplastic 2007 Followed closer than routine risk due to multiple large  hyperplastic polyps 5 adenomas with one of them serrated  05/29/2015 11 polyps max 10 mm adenomas, ssa's, hyperplastic - 09/21/2017 3 diminutive polyps removed adenomas - recall 2021 (2 yrs)      Hypercholesterolemia 11/25/2015   Hyperlipidemia    Hypertension    Left foot pain 01/27/2016   Nocturia more than twice per night 12/31/2015   Pain in joints 12/31/2015   Pectoralis major tendinitis, right 03/18/2017   Pericarditis    Peripheral neuropathy 04/24/2018   Pneumonia    Primary osteoarthritis of right foot 11/14/2015   Shortness of breath 09/29/2017   Status post right foot surgery 06/23/2016   Strain of right pectoralis muscle 09/07/2016   Thoracic aortic aneurysm ([place]) 06/08/2017   Traumatic brain injury ([place])    1976   Ulcer    Varicose veins    Varicose veins of lower extremities with ulcer ([place]) 11/16/2012    Past Surgical History:  Procedure Laterality Date   BURR HOLE OF CRANIUM  04/10/75   COLONOSCOPY     COLONOSCOPY W/ POLYPECTOMY  2004, 2007, 02/03/11   multiple large hyperplastic polyps 04/07, adenomas and one serrated 2012   ENDOVENOUS ABLATION SAPHENOUS VEIN W/ LASER Left 09-02-2010   EVLA left great and small saphenous vein by Curt Jews  MD    FOOT FUSION Right    [place]   neck fusion     POLYPECTOMY     ROTATOR CUFF REPAIR Right 2019   rt. knee replacement     THORACIC AORTIC ENDOVASCULAR STENT GRAFT N/A 06/08/2017   Procedure: THORACIC AORTIC ENDOVASCULAR STENT GRAFT;  Surgeon: Rosetta Posner, MD;  Location: [facility] OR;  Service: Vascular;  Laterality: N/A;    There were no vitals filed for this visit.   Subjective Assessment - 03/31/21 1322     Subjective Pt reports no changes or problems - continues to do exercises at home, especially the stretches    Patient Stated Goals increase ROM in Rt ankle; increase strength in RLE    Currently in Pain? No/denies                Rt active dorsiflexion +4 degrees  TUG score 14.75 secs without  device             [facility] Adult PT Treatment/Exercise - 04/01/21 0001       Exercises   Exercises Ankle      Knee/Hip Exercises: Stretches   Active Hamstring Stretch Right;Left    Gastroc Stretch Both;1 rep;30 seconds   heels off edge of step     Knee/Hip Exercises: Machines for Strengthening   Cybex Leg Press bil. LE's 70# 3 sets 10 reps      Knee/Hip Exercises: Standing   Heel Raises Both;1 set;10 reps    Forward Step Up Right;1 set;10 reps;Hand Hold: 1;Step Height: 6"    Other Standing Knee Exercises Pt performed stepping forward/back from board to floor and then stepping from board backward to floor - 10 reps each foot each direction with UE support on // bars      Ankle Exercises: Seated   Toe Raise 10 reps   with red theraband for dorsiflexor strengthening   Other Seated Ankle Exercises Rt ankle inversion and eversion 10 reps each with red theraband                 Balance Exercises - 04/01/21 0001       Balance Exercises: Standing   Rockerboard Anterior/posterior;EO;Other reps (comment)   20 reps   Step Over Hurdles / Cones stepped over and back of balance beam 5 reps with each foot    Other Standing Exercises Cone taps with each foot to 3 cones with UE support on counter prn                 PT Short Term Goals - 03/31/21 1330       PT SHORT TERM GOAL #1   Title Independent in HEP for Rt ankle strengthening.    Baseline met 03-31-21    Time 3    Period Weeks    Status Achieved    Target Date 03/27/21      PT SHORT TERM GOAL #2   Title Improve TUG score from 14.54 secs with no device to </= 13.0 secs for reduced fall risk.    Baseline 14.54 secs without device;  14.75 secs on 03-31-21    Time 3    Period Weeks    Status Not Met    Target Date 03/27/21      PT SHORT TERM GOAL #3   Title Increase Rt ankle dorsiflexion from -8 degrees actively to </= -4 degrees actively.    Baseline -8 degrees; +4 degrees - from 9 degrees plantarflexion  to +4  degrees dorsiflexion    Time 3    Period Weeks    Status Achieved    Target Date 03/27/21               PT Long Term Goals - 03/31/21 1339       PT LONG TERM GOAL #1   Title Increase TUG score from 14.54 secs to </= 12.5 secs without device for reduced fall risk.    Baseline 14.54 secs without device    Time 6    Period Weeks    Status New      PT LONG TERM GOAL #2   Title Increase gait velocity from 2.28 ft/sec to >/= 2.8 ft/sec without device for increased gait effiiciency.    Baseline 14.38 secs = 2.28 ft/sec    Time 6    Period Weeks    Status New      PT LONG TERM GOAL #3   Title Increase Rt ankle dorsiflexion to >/= -2 degrees actively and to >/= 10 degrees passively for improved gait pattern.    Baseline -8 degrees actively; +4 degrees passively    Time 6    Period Weeks    Status New      PT LONG TERM GOAL #4   Title Independent in updated HEP for Rt ankle strengthening & stretching.    Time 6    Period Weeks    Status New                   Plan - 04/01/21 1536     Clinical Impression Statement Pt is progressing well with ankle strengthening and balance exercises; pt has increased active Rt dorsiflexion to +4 degrees (from -9 degrees at initial eval); pt has met STG's #1 & 3.  STG #2 not met as TUG remains unchanged.  Cont with POC.    Personal Factors and Comorbidities Comorbidity 2;Time since onset of injury/illness/exacerbation;Past/Current Experience    Comorbidities h/o Rt foot fusion    Examination-Activity Limitations Squat;Transfers;Locomotion Level;Stand;Stairs    Examination-Participation Restrictions Shop;Meal Prep;Yard Work;Cleaning;Community Activity    Stability/Clinical Decision Making Stable/Uncomplicated    Rehab Potential Good    PT Frequency 1x / week    PT Duration 6 weeks   + eval   PT Treatment/Interventions ADLs/Self Care Home Management;Balance training;Neuromuscular re-education;Gait training;Stair  training;Patient/family education;Therapeutic activities;Therapeutic exercise;Passive range of motion    PT Next Visit Plan HEP for Rt heel cord stretching; Rt ankle strengthening exercises    Consulted and Agree with Plan of Care Patient             Patient will benefit from skilled therapeutic intervention in order to improve the following deficits and impairments:  Difficulty walking, Decreased balance, Decreased range of motion, Decreased strength, Impaired flexibility  Visit Diagnosis: Other abnormalities of gait and mobility  Muscle weakness (generalized)  Unsteadiness on feet     Problem List Patient Active Problem List   Diagnosis Date Noted   Gait abnormality 03/29/2019   Peripheral neuropathy 04/24/2018   S/P rotator cuff repair 01/31/2018   Nontraumatic tear of right supraspinatus tendon 01/19/2018   Brachial neuritis or radiculitis 11/24/2017   Chest wall pain 09/29/2017   Shortness of breath 09/29/2017   Health maintenance examination 09/29/2017   Thoracic aortic aneurysm ([place]) 06/08/2017   Acute pain of left shoulder 04/04/2017   Descending thoracic aortic aneurysm ([place]) 04/04/2017   Dyspnea on exertion 04/04/2017   Cough due to ACE inhibitor 03/18/2017   Pectoralis  major tendinitis, right 03/18/2017   Strain of right pectoralis muscle 09/07/2016   Status post right foot surgery 06/23/2016   Arthritis of midfoot 04/29/2016   Chronic pain of right ankle 04/08/2016   Foot pain, right 04/08/2016   Left foot pain 01/27/2016   Executive function deficit 12/31/2015   Nocturia more than twice per night 12/31/2015   Pain in joints 12/31/2015   ADD (attention deficit disorder) 11/25/2015   Depression 11/25/2015   Diverticulosis of colon 11/25/2015   Essential hypertension 11/25/2015   Hypercholesterolemia 11/25/2015   Primary osteoarthritis of right foot 11/14/2015   Family history of colon cancer in elderly mother 05/29/2015   Varicose veins of lower  extremities with ulcer ([place]) 11/16/2012   History of colonic polyps 02/04/2011    Alda Lea, PT 04/01/2021, 3:41 PM  [facility] [address] [place], [place], [address] Phone: [phone]   Fax:  [phone]  Name: Trevor [name] MRN: [id number] Date of Birth: [birth year]

## 2021-04-14 ENCOUNTER — Encounter: Payer: Self-pay | Admitting: Podiatry

## 2021-04-14 ENCOUNTER — Ambulatory Visit: Payer: Medicare Other | Attending: Orthopedic Surgery | Admitting: Physical Therapy

## 2021-04-14 ENCOUNTER — Ambulatory Visit (INDEPENDENT_AMBULATORY_CARE_PROVIDER_SITE_OTHER): Payer: Medicare Other | Admitting: Podiatry

## 2021-04-14 ENCOUNTER — Other Ambulatory Visit: Payer: Self-pay

## 2021-04-14 DIAGNOSIS — G629 Polyneuropathy, unspecified: Secondary | ICD-10-CM

## 2021-04-14 DIAGNOSIS — L84 Corns and callosities: Secondary | ICD-10-CM | POA: Diagnosis not present

## 2021-04-14 DIAGNOSIS — M6281 Muscle weakness (generalized): Secondary | ICD-10-CM

## 2021-04-14 DIAGNOSIS — R2689 Other abnormalities of gait and mobility: Secondary | ICD-10-CM | POA: Diagnosis not present

## 2021-04-14 DIAGNOSIS — M14679 Charcot's joint, unspecified ankle and foot: Secondary | ICD-10-CM

## 2021-04-14 DIAGNOSIS — R2681 Unsteadiness on feet: Secondary | ICD-10-CM | POA: Diagnosis not present

## 2021-04-15 NOTE — Therapy (Signed)
[facility] [address] [place], [place], [address] Phone: [phone]   Fax:  [phone]  Physical Therapy Treatment  Patient Details  Name: Trevor [name] MRN: [id number] Date of Birth: 14-Apr-1951 Referring Provider (PT): [place] Cellar, MD   Encounter Date: 04/14/2021   PT End of Session - 04/15/21 1048     Visit Number 4    Number of Visits 7   eval + 6 visits   Date for PT Re-Evaluation 04/17/21    Authorization Type Medicare    Authorization Time Period 03-03-21 - 05-03-21    PT Start Time 5366   pt arrived 10" late for appt.   PT Stop Time 1530    PT Time Calculation (min) 33 min    Activity Tolerance Patient tolerated treatment well    Behavior During Therapy WFL for tasks assessed/performed             Past Medical History:  Diagnosis Date   Acute pain of left shoulder 04/04/2017   ADD (attention deficit disorder)    Allergy    Anxiety    Arthritis    right foot   Arthritis of midfoot 04/29/2016   Overview:  Added automatically from request for surgery [id number]   Blood transfusion without reported diagnosis    Cataract    removed both eyes with lens implants    Chest wall pain 09/29/2017   Chronic pain of right ankle 04/08/2016   Cough due to ACE inhibitor 03/18/2017   Depression    Descending thoracic aortic aneurysm ([place]) 04/04/2017   Overview:  4.5 cm on CT scan of April 04, 2017.   Diverticulosis of colon 11/25/2015   Dyspnea on exertion 04/04/2017   Essential hypertension 11/25/2015   Executive function deficit 12/31/2015   Family history of colon cancer in mother    dx'd age late 20's   Foot pain, right 04/08/2016   Gait abnormality 03/29/2019   H/O blood clots    Health maintenance examination 09/29/2017   History of colonic polyps 02/04/2011   3 10 mm hyperplastic polyps removed from right and left colon 2004 4 polyps, one 10 mm others diminutive and hyperplastic 2007 Followed closer than routine  risk due to multiple large hyperplastic polyps 5 adenomas with one of them serrated  05/29/2015 11 polyps max 10 mm adenomas, ssa's, hyperplastic - 09/21/2017 3 diminutive polyps removed adenomas - recall 2021 (2 yrs)      Hypercholesterolemia 11/25/2015   Hyperlipidemia    Hypertension    Left foot pain 01/27/2016   Nocturia more than twice per night 12/31/2015   Pain in joints 12/31/2015   Pectoralis major tendinitis, right 03/18/2017   Pericarditis    Peripheral neuropathy 04/24/2018   Pneumonia    Primary osteoarthritis of right foot 11/14/2015   Shortness of breath 09/29/2017   Status post right foot surgery 06/23/2016   Strain of right pectoralis muscle 09/07/2016   Thoracic aortic aneurysm ([place]) 06/08/2017   Traumatic brain injury ([place])    1976   Ulcer    Varicose veins    Varicose veins of lower extremities with ulcer ([place]) 11/16/2012    Past Surgical History:  Procedure Laterality Date   BURR HOLE OF CRANIUM  04/10/75   COLONOSCOPY     COLONOSCOPY W/ POLYPECTOMY  2004, 2007, 02/03/11   multiple large hyperplastic polyps 04/07, adenomas and one serrated 2012   ENDOVENOUS ABLATION SAPHENOUS VEIN W/ LASER Left 09-02-2010   EVLA left great  and small saphenous vein by Curt Jews MD    FOOT FUSION Right    [facility]   neck fusion     POLYPECTOMY     ROTATOR CUFF REPAIR Right 2019   rt. knee replacement     THORACIC AORTIC ENDOVASCULAR STENT GRAFT N/A 06/08/2017   Procedure: THORACIC AORTIC ENDOVASCULAR STENT GRAFT;  Surgeon: Rosetta Posner, MD;  Location: [facility] OR;  Service: Vascular;  Laterality: N/A;    There were no vitals filed for this visit.   Subjective Assessment - 04/14/21 1458     Subjective Pt reports no changes - states he is doing well - arrives 10" late for appt    Patient Stated Goals increase ROM in Rt ankle; increase strength in RLE    Currently in Pain? No/denies                               [facility] Adult PT Treatment/Exercise -  04/15/21 0001       Transfers   Transfers Sit to Stand;Stand to Sit    Sit to Stand 6: Modified independent (Device/Increase time)    Comments 1 UE support used to stand from hi/low mat table      Knee/Hip Exercises: Stretches   Active Hamstring Stretch Right;Left    Gastroc Stretch Both;1 rep;30 seconds   heels off edge of step     Knee/Hip Exercises: Machines for Strengthening   Cybex Leg Press bil. LE's 70# 3 sets 10 reps      Knee/Hip Exercises: Standing   Heel Raises Both;1 set;10 reps   bil. UE support used on rails at steps   Hip Flexion Stengthening;Right;Left;1 set;10 reps   3# weight   Hip Abduction Stengthening;Right;Left;1 set;10 reps   3# weight   Hip Extension Stengthening;Right;Left;1 set;10 reps   3# weight used   Forward Step Up Right;1 set;10 reps;Hand Hold: 1;Step Height: 6"    Rocker Board Other (comment)   30 reps  with bil. UE support on // bars   Other Standing Knee Exercises pt performed tap ups to 6" step with LLE for improved SLS on RLE and for closed chain strengthening                      PT Short Term Goals - 04/15/21 1051       PT SHORT TERM GOAL #1   Title Independent in HEP for Rt ankle strengthening.    Baseline met 03-31-21    Time 3    Period Weeks    Status Achieved    Target Date 03/27/21      PT SHORT TERM GOAL #2   Title Improve TUG score from 14.54 secs with no device to </= 13.0 secs for reduced fall risk.    Baseline 14.54 secs without device;  14.75 secs on 03-31-21    Time 3    Period Weeks    Status Not Met    Target Date 03/27/21      PT SHORT TERM GOAL #3   Title Increase Rt ankle dorsiflexion from -8 degrees actively to </= -4 degrees actively.    Baseline -8 degrees; +4 degrees - from 9 degrees plantarflexion to +4 degrees dorsiflexion    Time 3    Period Weeks    Status Achieved    Target Date 03/27/21  PT Long Term Goals - 04/15/21 1051       PT LONG TERM GOAL #1   Title Increase  TUG score from 14.54 secs to </= 12.5 secs without device for reduced fall risk.    Baseline 14.54 secs without device    Time 6    Period Weeks    Status New      PT LONG TERM GOAL #2   Title Increase gait velocity from 2.28 ft/sec to >/= 2.8 ft/sec without device for increased gait effiiciency.    Baseline 14.38 secs = 2.28 ft/sec    Time 6    Period Weeks    Status New      PT LONG TERM GOAL #3   Title Increase Rt ankle dorsiflexion to >/= -2 degrees actively and to >/= 10 degrees passively for improved gait pattern.    Baseline -8 degrees actively; +4 degrees passively    Time 6    Period Weeks    Status New      PT LONG TERM GOAL #4   Title Independent in updated HEP for Rt ankle strengthening & stretching.    Time 6    Period Weeks    Status New                   Plan - 04/15/21 1049     Clinical Impression Statement Pt is progressing well towards goals - continues to be unable to perform plantarflexion in standing unilaterally on RLE due to Rt ankle fusion.  Plan D/C next session as pt is doing well with mobility and balance and is plateauing in maximizing functional progress at this time.    Personal Factors and Comorbidities Comorbidity 2;Time since onset of injury/illness/exacerbation;Past/Current Experience    Comorbidities h/o Rt foot fusion    Examination-Activity Limitations Squat;Transfers;Locomotion Level;Stand;Stairs    Examination-Participation Restrictions Shop;Meal Prep;Yard Work;Cleaning;Community Activity    Stability/Clinical Decision Making Stable/Uncomplicated    Rehab Potential Good    PT Frequency 1x / week    PT Duration 6 weeks   + eval   PT Treatment/Interventions ADLs/Self Care Home Management;Balance training;Neuromuscular re-education;Gait training;Stair training;Patient/family education;Therapeutic activities;Therapeutic exercise;Passive range of motion    PT Next Visit Plan HEP for Rt heel cord stretching; Rt ankle strengthening  exercises    Consulted and Agree with Plan of Care Patient             Patient will benefit from skilled therapeutic intervention in order to improve the following deficits and impairments:  Difficulty walking, Decreased balance, Decreased range of motion, Decreased strength, Impaired flexibility  Visit Diagnosis: Other abnormalities of gait and mobility  Muscle weakness (generalized)  Unsteadiness on feet     Problem List Patient Active Problem List   Diagnosis Date Noted   Gait abnormality 03/29/2019   Peripheral neuropathy 04/24/2018   S/P rotator cuff repair 01/31/2018   Nontraumatic tear of right supraspinatus tendon 01/19/2018   Brachial neuritis or radiculitis 11/24/2017   Chest wall pain 09/29/2017   Shortness of breath 09/29/2017   Health maintenance examination 09/29/2017   Thoracic aortic aneurysm ([place]) 06/08/2017   Acute pain of left shoulder 04/04/2017   Descending thoracic aortic aneurysm ([place]) 04/04/2017   Dyspnea on exertion 04/04/2017   Cough due to ACE inhibitor 03/18/2017   Pectoralis major tendinitis, right 03/18/2017   Strain of right pectoralis muscle 09/07/2016   Status post right foot surgery 06/23/2016   Arthritis of midfoot 04/29/2016   Chronic pain of right ankle  04/08/2016   Foot pain, right 04/08/2016   Left foot pain 01/27/2016   Executive function deficit 12/31/2015   Nocturia more than twice per night 12/31/2015   Pain in joints 12/31/2015   ADD (attention deficit disorder) 11/25/2015   Depression 11/25/2015   Diverticulosis of colon 11/25/2015   Essential hypertension 11/25/2015   Hypercholesterolemia 11/25/2015   Primary osteoarthritis of right foot 11/14/2015   Family history of colon cancer in elderly mother 05/29/2015   Varicose veins of lower extremities with ulcer ([place]) 11/16/2012   History of colonic polyps 02/04/2011    Alda Lea, PT 04/15/2021, 10:52 AM  [facility] [address] [place], [place], [address] Phone: [phone]   Fax:  [phone]  Name: Trevor [name] MRN: [id number] Date of Birth: 12/07/1950

## 2021-04-16 ENCOUNTER — Ambulatory Visit: Payer: Medicare Other | Admitting: Podiatry

## 2021-04-20 NOTE — Progress Notes (Signed)
Subjective: 70 year old male presents the office today for follow-up evaluation of pre-ulcerative calluses on his right foot.  He states that started to get sick again and he feels he is walking on something.  Denies any swelling or redness or any drainage.  He follows with [facility] for Charcot.  Objective: AAO x3, NAD DP/PT pulses palpable bilaterally, CRT less than 3 seconds On the right foot plantar submetatarsal 1, hallux is hyperkeratotic tissue and upon debridement there is no underlying ulceration drainage or any signs of infection.  Charcot changes noted  No pain with calf compression, swelling, warmth, erythema  Assessment: Healed ulcerations right foot, Charcot  Plan: -All treatment options discussed with the patient including all alternatives, risks, complications.  -Sharply debrided the hyperkeratotic lesions without any complications x2 without any complications or bleeding.   -Continue offloading. -Follow-up with orthopedics as scheduled  Trula Slade DPM

## 2021-04-21 ENCOUNTER — Other Ambulatory Visit: Payer: Self-pay

## 2021-04-21 ENCOUNTER — Ambulatory Visit: Payer: Medicare Other | Admitting: Physical Therapy

## 2021-04-21 DIAGNOSIS — R2689 Other abnormalities of gait and mobility: Secondary | ICD-10-CM

## 2021-04-21 DIAGNOSIS — R2681 Unsteadiness on feet: Secondary | ICD-10-CM

## 2021-04-21 DIAGNOSIS — M6281 Muscle weakness (generalized): Secondary | ICD-10-CM

## 2021-04-22 NOTE — Therapy (Signed)
[facility] [address] [place], [place], [address] Phone: [phone]   Fax:  [phone]  Physical Therapy Treatment  Patient Details  Name: Trevor [name] MRN: [id number] Date of Birth: [birth year] Referring Provider (PT): Winchester Cellar, MD   Encounter Date: 04/21/2021   PT End of Session - 04/22/21 2043     Visit Number 5    Number of Visits 7   eval + 6 visits   Date for PT Re-Evaluation 04/17/21    Authorization Type Medicare    Authorization Time Period 03-03-21 - 05-03-21    PT Start Time 1450    PT Stop Time 1530    PT Time Calculation (min) 40 min    Activity Tolerance Patient tolerated treatment well    Behavior During Therapy [facility] for tasks assessed/performed             Past Medical History:  Diagnosis Date   Acute pain of left shoulder 04/04/2017   ADD (attention deficit disorder)    Allergy    Anxiety    Arthritis    right foot   Arthritis of midfoot 04/29/2016   Overview:  Added automatically from request for surgery [id number]   Blood transfusion without reported diagnosis    Cataract    removed both eyes with lens implants    Chest wall pain 09/29/2017   Chronic pain of right ankle 04/08/2016   Cough due to ACE inhibitor 03/18/2017   Depression    Descending thoracic aortic aneurysm ([place]) 04/04/2017   Overview:  4.5 cm on CT scan of April 04, 2017.   Diverticulosis of colon 11/25/2015   Dyspnea on exertion 04/04/2017   Essential hypertension 11/25/2015   Executive function deficit 12/31/2015   Family history of colon cancer in mother    dx'd age late 55's   Foot pain, right 04/08/2016   Gait abnormality 03/29/2019   H/O blood clots    Health maintenance examination 09/29/2017   History of colonic polyps 02/04/2011   3 10 mm hyperplastic polyps removed from right and left colon 2004 4 polyps, one 10 mm others diminutive and hyperplastic 2007 Followed closer than routine risk due to multiple large  hyperplastic polyps 5 adenomas with one of them serrated  05/29/2015 11 polyps max 10 mm adenomas, ssa's, hyperplastic - 09/21/2017 3 diminutive polyps removed adenomas - recall 2021 (2 yrs)      Hypercholesterolemia 11/25/2015   Hyperlipidemia    Hypertension    Left foot pain 01/27/2016   Nocturia more than twice per night 12/31/2015   Pain in joints 12/31/2015   Pectoralis major tendinitis, right 03/18/2017   Pericarditis    Peripheral neuropathy 04/24/2018   Pneumonia    Primary osteoarthritis of right foot 11/14/2015   Shortness of breath 09/29/2017   Status post right foot surgery 06/23/2016   Strain of right pectoralis muscle 09/07/2016   Thoracic aortic aneurysm ([place]) 06/08/2017   Traumatic brain injury ([place])    1976   Ulcer    Varicose veins    Varicose veins of lower extremities with ulcer ([place]) 11/16/2012    Past Surgical History:  Procedure Laterality Date   BURR HOLE OF CRANIUM  04/10/75   COLONOSCOPY     COLONOSCOPY W/ POLYPECTOMY  2004, 2007, 02/03/11   multiple large hyperplastic polyps 04/07, adenomas and one serrated 2012   ENDOVENOUS ABLATION SAPHENOUS VEIN W/ LASER Left 09-02-2010   EVLA left great and small saphenous vein by Curt Jews  MD    FOOT FUSION Right    [place]   neck fusion     POLYPECTOMY     ROTATOR CUFF REPAIR Right 2019   rt. knee replacement     THORACIC AORTIC ENDOVASCULAR STENT GRAFT N/A 06/08/2017   Procedure: THORACIC AORTIC ENDOVASCULAR STENT GRAFT;  Surgeon: Rosetta Posner, MD;  Location: [facility] OR;  Service: Vascular;  Laterality: N/A;    There were no vitals filed for this visit.   Subjective Assessment - 04/22/21 2039     Subjective Pt reports he is doing well - ready for D/C    Patient Stated Goals increase ROM in Rt ankle; increase strength in RLE    Currently in Pain? No/denies                               [facility] Adult PT Treatment/Exercise - 04/22/21 0001       Transfers   Transfers Sit to Stand;Stand  to Sit    Sit to Stand 6: Modified independent (Device/Increase time)    Comments 1 UE support used to stand from hi/low mat table      Knee/Hip Exercises: Stretches   Active Hamstring Stretch Right;Left;1 rep;30 seconds   Runner's stretch   Gastroc Stretch Both;1 rep;30 seconds   heels off edge of step     Knee/Hip Exercises: Machines for Strengthening   Cybex Leg Press bil. LE's 70# 3 sets 10 reps      Knee/Hip Exercises: Standing   Hip Flexion Stengthening;Right;Left;1 set;10 reps   3# weight   Hip Abduction Stengthening;Right;Left;1 set;10 reps   3# weight   Hip Extension Stengthening;Right;Left;1 set;10 reps   3# weight used   Forward Step Up Right;1 set;10 reps;Hand Hold: 2;Step Height: 6"                      PT Short Term Goals - 04/22/21 2044       PT SHORT TERM GOAL #1   Title Independent in HEP for Rt ankle strengthening.    Baseline met 03-31-21    Time 3    Period Weeks    Status Achieved    Target Date 03/27/21      PT SHORT TERM GOAL #2   Title Improve TUG score from 14.54 secs with no device to </= 13.0 secs for reduced fall risk.    Baseline 14.54 secs without device;  14.75 secs on 03-31-21    Time 3    Period Weeks    Status Not Met    Target Date 03/27/21      PT SHORT TERM GOAL #3   Title Increase Rt ankle dorsiflexion from -8 degrees actively to </= -4 degrees actively.    Baseline -8 degrees; +4 degrees - from 9 degrees plantarflexion to +4 degrees dorsiflexion    Time 3    Period Weeks    Status Achieved    Target Date 03/27/21               PT Long Term Goals - 04/22/21 2045       PT LONG TERM GOAL #1   Title Increase TUG score from 14.54 secs to </= 12.5 secs without device for reduced fall risk.    Baseline 14.54 secs without device;  12.78 secs - 04-21-21    Time 6    Period Weeks    Status Partially Met  PT LONG TERM GOAL #2   Title Increase gait velocity from 2.28 ft/sec to >/= 2.8 ft/sec without device for  increased gait effiiciency.    Baseline 14.38 secs = 2.28 ft/sec;  10.59 secs = 3.10 ft/sec without device    Time 6    Period Weeks    Status Achieved      PT LONG TERM GOAL #3   Title Increase Rt ankle dorsiflexion to >/= -2 degrees actively and to >/= 10 degrees passively for improved gait pattern.    Baseline -8 degrees actively; +4 degrees passively    Time 6    Period Weeks    Status Achieved      PT LONG TERM GOAL #4   Title Independent in updated HEP for Rt ankle strengthening & stretching.    Time 6    Period Weeks    Status Achieved                   Plan - 04/22/21 2047     Clinical Impression Statement Pt has met all LTG's - discharge due to goals met and no further needs identified at this time.    Personal Factors and Comorbidities Comorbidity 2;Time since onset of injury/illness/exacerbation;Past/Current Experience    Comorbidities h/o Rt foot fusion    Examination-Activity Limitations Squat;Transfers;Locomotion Level;Stand;Stairs    Examination-Participation Restrictions Shop;Meal Prep;Yard Work;Cleaning;Community Activity    Stability/Clinical Decision Making Stable/Uncomplicated    Rehab Potential Good    PT Frequency 1x / week    PT Duration 6 weeks   + eval   PT Treatment/Interventions ADLs/Self Care Home Management;Balance training;Neuromuscular re-education;Gait training;Stair training;Patient/family education;Therapeutic activities;Therapeutic exercise;Passive range of motion    PT Next Visit Plan D/C on 04-21-21    Consulted and Agree with Plan of Care Patient             Patient will benefit from skilled therapeutic intervention in order to improve the following deficits and impairments:  Difficulty walking, Decreased balance, Decreased range of motion, Decreased strength, Impaired flexibility  Visit Diagnosis: Other abnormalities of gait and mobility  Muscle weakness (generalized)  Unsteadiness on feet     Problem List Patient  Active Problem List   Diagnosis Date Noted   Gait abnormality 03/29/2019   Peripheral neuropathy 04/24/2018   S/P rotator cuff repair 01/31/2018   Nontraumatic tear of right supraspinatus tendon 01/19/2018   Brachial neuritis or radiculitis 11/24/2017   Chest wall pain 09/29/2017   Shortness of breath 09/29/2017   Health maintenance examination 09/29/2017   Thoracic aortic aneurysm ([place]) 06/08/2017   Acute pain of left shoulder 04/04/2017   Descending thoracic aortic aneurysm ([place]) 04/04/2017   Dyspnea on exertion 04/04/2017   Cough due to ACE inhibitor 03/18/2017   Pectoralis major tendinitis, right 03/18/2017   Strain of right pectoralis muscle 09/07/2016   Status post right foot surgery 06/23/2016   Arthritis of midfoot 04/29/2016   Chronic pain of right ankle 04/08/2016   Foot pain, right 04/08/2016   Left foot pain 01/27/2016   Executive function deficit 12/31/2015   Nocturia more than twice per night 12/31/2015   Pain in joints 12/31/2015   ADD (attention deficit disorder) 11/25/2015   Depression 11/25/2015   Diverticulosis of colon 11/25/2015   Essential hypertension 11/25/2015   Hypercholesterolemia 11/25/2015   Primary osteoarthritis of right foot 11/14/2015   Family history of colon cancer in elderly mother 05/29/2015   Varicose veins of lower extremities with ulcer ([place]) 11/16/2012   History  of colonic polyps 02/04/2011      PHYSICAL THERAPY DISCHARGE SUMMARY  Visits from Start of Care:  5  Current functional level related to goals / functional outcomes: See above for progress towards goals - LTG's met    Remaining deficits: Cont Rt ankle decreased ROM due to s/p foot fusion Cont decreased high level balance and gait deficits   Education / Equipment: Pt has been instructed in Rt ankle stretching and strengthening exs.  Patient agrees to discharge. Patient goals were met. Patient is being discharged due to meeting the stated rehab goals.     Alda Lea, PT 04/22/2021, 8:52 PM  [facility] [address] [place], [place], [address] Phone: [phone]   Fax:  [phone]  Name: Trevor [name] MRN: [id number] Date of Birth: 1950/10/29

## 2021-04-24 DIAGNOSIS — N5201 Erectile dysfunction due to arterial insufficiency: Secondary | ICD-10-CM | POA: Diagnosis not present

## 2021-04-24 DIAGNOSIS — R351 Nocturia: Secondary | ICD-10-CM | POA: Diagnosis not present

## 2021-04-24 DIAGNOSIS — R3915 Urgency of urination: Secondary | ICD-10-CM | POA: Diagnosis not present

## 2021-04-24 DIAGNOSIS — N401 Enlarged prostate with lower urinary tract symptoms: Secondary | ICD-10-CM | POA: Diagnosis not present

## 2021-04-27 ENCOUNTER — Ambulatory Visit: Payer: Medicare Other | Admitting: Podiatry

## 2021-05-06 DIAGNOSIS — Z20822 Contact with and (suspected) exposure to covid-19: Secondary | ICD-10-CM | POA: Diagnosis not present

## 2021-05-19 ENCOUNTER — Ambulatory Visit (INDEPENDENT_AMBULATORY_CARE_PROVIDER_SITE_OTHER): Payer: Medicare Other | Admitting: Podiatry

## 2021-05-19 ENCOUNTER — Other Ambulatory Visit: Payer: Self-pay

## 2021-05-19 DIAGNOSIS — M14679 Charcot's joint, unspecified ankle and foot: Secondary | ICD-10-CM | POA: Diagnosis not present

## 2021-05-19 DIAGNOSIS — L84 Corns and callosities: Secondary | ICD-10-CM | POA: Diagnosis not present

## 2021-05-19 DIAGNOSIS — G629 Polyneuropathy, unspecified: Secondary | ICD-10-CM

## 2021-05-23 NOTE — Progress Notes (Signed)
Subjective: 70 year old male presents the office today for follow-up evaluation of pre-ulcerative calluses on his right foot.  He states he has not seen any open sores or swelling or redness or any drainage but the areas are still tender when he walks.  He has no new concerns today.  Follow-up with [facility] at the end of the month for Charcot.   Objective: AAO x3, NAD DP/PT pulses palpable bilaterally, CRT less than 3 seconds On the right foot plantar submetatarsal 1, hallux is hyperkeratotic tissue and upon debridement there is no underlying ulceration drainage or any signs of infection.  Charcot changes noted  No pain with calf compression, swelling, warmth, erythema  Assessment: Healed ulcerations right foot, Charcot  Plan: -All treatment options discussed with the patient including all alternatives, risks, complications.  -Sharply debrided the hyperkeratotic lesions without any complications x2 without any complications or bleeding.   -Continue offloading. -Follow-up with orthopedics as scheduled  Trula Slade DPM

## 2021-05-26 ENCOUNTER — Ambulatory Visit: Payer: Medicare Other | Admitting: Podiatry

## 2021-06-30 ENCOUNTER — Encounter: Payer: Self-pay | Admitting: Podiatry

## 2021-06-30 ENCOUNTER — Ambulatory Visit (INDEPENDENT_AMBULATORY_CARE_PROVIDER_SITE_OTHER): Payer: Medicare Other | Admitting: Podiatry

## 2021-06-30 ENCOUNTER — Other Ambulatory Visit: Payer: Self-pay

## 2021-06-30 DIAGNOSIS — G629 Polyneuropathy, unspecified: Secondary | ICD-10-CM

## 2021-06-30 DIAGNOSIS — L84 Corns and callosities: Secondary | ICD-10-CM | POA: Diagnosis not present

## 2021-07-07 NOTE — Progress Notes (Signed)
Subjective: 70 year old male presents the office today for follow-up evaluation of pre-ulcerative calluses on his right foot.  No open lesions he reports.  No swelling redness or drainage.  He has no new concerns today.  Follow-up with [facility] at the end of the month for Charcot.   Objective: AAO x3, NAD DP/PT pulses palpable bilaterally, CRT less than 3 seconds On the right foot plantar submetatarsal 1, hallux is hyperkeratotic tissue and upon debridement there is no underlying ulceration drainage or any signs of infection.  Charcot changes noted  No pain with calf compression, swelling, warmth, erythema  Assessment: Healed ulcerations right foot, Charcot  Plan: -All treatment options discussed with the patient including all alternatives, risks, complications.  -Sharply debrided the hyperkeratotic lesions without any complications x2 without any complications or bleeding.   -Continue offloading. -Follow-up with orthopedics as scheduled  Trula Slade DPM

## 2021-07-21 DIAGNOSIS — Z23 Encounter for immunization: Secondary | ICD-10-CM | POA: Diagnosis not present

## 2021-08-04 DIAGNOSIS — L820 Inflamed seborrheic keratosis: Secondary | ICD-10-CM | POA: Diagnosis not present

## 2021-08-13 ENCOUNTER — Ambulatory Visit (INDEPENDENT_AMBULATORY_CARE_PROVIDER_SITE_OTHER): Payer: Medicare Other | Admitting: Podiatry

## 2021-08-13 ENCOUNTER — Other Ambulatory Visit: Payer: Self-pay

## 2021-08-13 DIAGNOSIS — G629 Polyneuropathy, unspecified: Secondary | ICD-10-CM

## 2021-08-13 DIAGNOSIS — L84 Corns and callosities: Secondary | ICD-10-CM

## 2021-08-16 NOTE — Progress Notes (Signed)
Subjective: 70 year old male presents the office today for follow-up evaluation of pre-ulcerative calluses on his right foot. He has been using an over-the-counter callus shaver to help with the calluses.  Denies any swelling redness or any drainage.  No open lesions.  No fevers or chills.  No new concerns.   Objective: AAO x3, NAD DP/PT pulses palpable bilaterally, CRT less than 3 seconds On the right foot plantar submetatarsal 1, hallux is hyperkeratotic tissue and upon debridement there is no underlying ulceration drainage or any signs of infection.  There is dried blood present underneath this but upon debridement no underlying ulceration or signs of infection.  Preulcerative calluses right foot Charcot changes noted  No pain with calf compression, swelling, warmth, erythema  Assessment: Healed ulcerations right foot, Charcot  Plan: -All treatment options discussed with the patient including all alternatives, risks, complications.  -Sharply debrided the hyperkeratotic lesions without any complications x2 without any complications or bleeding.  We will be very careful with any type of device to shave the calluses.  Recommended moisturizer and offloading. -Continue offloading. -Follow-up with orthopedics as scheduled  Trula Slade DPM

## 2021-08-21 DIAGNOSIS — Z125 Encounter for screening for malignant neoplasm of prostate: Secondary | ICD-10-CM | POA: Diagnosis not present

## 2021-08-21 DIAGNOSIS — E785 Hyperlipidemia, unspecified: Secondary | ICD-10-CM | POA: Diagnosis not present

## 2021-08-21 DIAGNOSIS — I10 Essential (primary) hypertension: Secondary | ICD-10-CM | POA: Diagnosis not present

## 2021-08-28 ENCOUNTER — Other Ambulatory Visit: Payer: Self-pay | Admitting: Internal Medicine

## 2021-08-28 DIAGNOSIS — I10 Essential (primary) hypertension: Secondary | ICD-10-CM | POA: Diagnosis not present

## 2021-08-28 DIAGNOSIS — Z8249 Family history of ischemic heart disease and other diseases of the circulatory system: Secondary | ICD-10-CM

## 2021-08-28 DIAGNOSIS — G894 Chronic pain syndrome: Secondary | ICD-10-CM | POA: Diagnosis not present

## 2021-08-28 DIAGNOSIS — M14679 Charcot's joint, unspecified ankle and foot: Secondary | ICD-10-CM | POA: Diagnosis not present

## 2021-08-28 DIAGNOSIS — N1831 Chronic kidney disease, stage 3a: Secondary | ICD-10-CM | POA: Diagnosis not present

## 2021-08-28 DIAGNOSIS — N3281 Overactive bladder: Secondary | ICD-10-CM | POA: Diagnosis not present

## 2021-08-28 DIAGNOSIS — Z0000 Encounter for general adult medical examination without abnormal findings: Secondary | ICD-10-CM | POA: Diagnosis not present

## 2021-08-28 DIAGNOSIS — R600 Localized edema: Secondary | ICD-10-CM | POA: Diagnosis not present

## 2021-08-28 DIAGNOSIS — Z1339 Encounter for screening examination for other mental health and behavioral disorders: Secondary | ICD-10-CM | POA: Diagnosis not present

## 2021-08-28 DIAGNOSIS — Z1331 Encounter for screening for depression: Secondary | ICD-10-CM | POA: Diagnosis not present

## 2021-08-28 DIAGNOSIS — R0602 Shortness of breath: Secondary | ICD-10-CM | POA: Diagnosis not present

## 2021-08-28 DIAGNOSIS — F330 Major depressive disorder, recurrent, mild: Secondary | ICD-10-CM | POA: Diagnosis not present

## 2021-08-31 ENCOUNTER — Other Ambulatory Visit (HOSPITAL_COMMUNITY): Payer: Self-pay | Admitting: Internal Medicine

## 2021-08-31 DIAGNOSIS — R600 Localized edema: Secondary | ICD-10-CM

## 2021-08-31 DIAGNOSIS — R0602 Shortness of breath: Secondary | ICD-10-CM

## 2021-09-01 ENCOUNTER — Ambulatory Visit: Payer: Medicare Other | Admitting: Podiatry

## 2021-09-02 ENCOUNTER — Ambulatory Visit (HOSPITAL_COMMUNITY)
Admission: RE | Admit: 2021-09-02 | Discharge: 2021-09-02 | Disposition: A | Discharge status: Home / Self Care | Payer: Medicare Other | Source: Ambulatory Visit | Attending: Internal Medicine | Admitting: Internal Medicine

## 2021-09-02 ENCOUNTER — Other Ambulatory Visit: Payer: Self-pay

## 2021-09-02 DIAGNOSIS — R600 Localized edema: Secondary | ICD-10-CM | POA: Diagnosis not present

## 2021-09-02 DIAGNOSIS — R0602 Shortness of breath: Secondary | ICD-10-CM | POA: Diagnosis not present

## 2021-09-02 DIAGNOSIS — I712 Thoracic aortic aneurysm, without rupture, unspecified: Secondary | ICD-10-CM | POA: Diagnosis not present

## 2021-09-02 DIAGNOSIS — E785 Hyperlipidemia, unspecified: Secondary | ICD-10-CM | POA: Diagnosis not present

## 2021-09-02 DIAGNOSIS — I517 Cardiomegaly: Secondary | ICD-10-CM | POA: Diagnosis not present

## 2021-09-02 DIAGNOSIS — I10 Essential (primary) hypertension: Secondary | ICD-10-CM | POA: Diagnosis not present

## 2021-09-02 LAB — ECHOCARDIOGRAM COMPLETE
Area-P 1/2: 3.42 cm2
S' Lateral: 2.3 cm

## 2021-09-02 NOTE — Progress Notes (Signed)
°  Echocardiogram 2D Echocardiogram has been performed.  Darlina Sicilian M 09/02/2021, 2:29 PM

## 2021-09-09 DIAGNOSIS — J069 Acute upper respiratory infection, unspecified: Secondary | ICD-10-CM | POA: Diagnosis not present

## 2021-09-09 DIAGNOSIS — R062 Wheezing: Secondary | ICD-10-CM | POA: Diagnosis not present

## 2021-09-09 DIAGNOSIS — R051 Acute cough: Secondary | ICD-10-CM | POA: Diagnosis not present

## 2021-09-09 DIAGNOSIS — R5383 Other fatigue: Secondary | ICD-10-CM | POA: Diagnosis not present

## 2021-09-09 DIAGNOSIS — R509 Fever, unspecified: Secondary | ICD-10-CM | POA: Diagnosis not present

## 2021-09-09 DIAGNOSIS — R0981 Nasal congestion: Secondary | ICD-10-CM | POA: Diagnosis not present

## 2021-09-09 DIAGNOSIS — Z1152 Encounter for screening for COVID-19: Secondary | ICD-10-CM | POA: Diagnosis not present

## 2021-09-10 ENCOUNTER — Ambulatory Visit: Payer: Medicare Other | Admitting: Podiatry

## 2021-09-15 DIAGNOSIS — M19071 Primary osteoarthritis, right ankle and foot: Secondary | ICD-10-CM | POA: Diagnosis not present

## 2021-09-15 DIAGNOSIS — M14671 Charcot's joint, right ankle and foot: Secondary | ICD-10-CM | POA: Diagnosis not present

## 2021-09-15 DIAGNOSIS — M7062 Trochanteric bursitis, left hip: Secondary | ICD-10-CM | POA: Diagnosis not present

## 2021-10-12 DIAGNOSIS — R3915 Urgency of urination: Secondary | ICD-10-CM | POA: Diagnosis not present

## 2021-10-12 DIAGNOSIS — N401 Enlarged prostate with lower urinary tract symptoms: Secondary | ICD-10-CM | POA: Diagnosis not present

## 2021-10-12 DIAGNOSIS — R351 Nocturia: Secondary | ICD-10-CM | POA: Diagnosis not present

## 2021-10-14 ENCOUNTER — Ambulatory Visit: Payer: Medicare Other | Admitting: Vascular Surgery

## 2021-10-19 ENCOUNTER — Ambulatory Visit (INDEPENDENT_AMBULATORY_CARE_PROVIDER_SITE_OTHER): Payer: Medicare Other | Admitting: Podiatry

## 2021-10-19 ENCOUNTER — Other Ambulatory Visit: Payer: Self-pay | Admitting: Podiatry

## 2021-10-19 ENCOUNTER — Other Ambulatory Visit: Payer: Self-pay

## 2021-10-19 DIAGNOSIS — M14679 Charcot's joint, unspecified ankle and foot: Secondary | ICD-10-CM

## 2021-10-19 DIAGNOSIS — L97512 Non-pressure chronic ulcer of other part of right foot with fat layer exposed: Secondary | ICD-10-CM | POA: Diagnosis not present

## 2021-10-19 DIAGNOSIS — L84 Corns and callosities: Secondary | ICD-10-CM | POA: Diagnosis not present

## 2021-10-19 MED ORDER — SILVER SULFADIAZINE 1 % EX CREA: 1.0000 "application " | TOPICAL_CREAM | Freq: Every day | CUTANEOUS | 0 refills | Status: DC

## 2021-10-19 MED ORDER — DOXYCYCLINE HYCLATE 100 MG PO TABS: 100.0000 mg | ORAL_TABLET | Freq: Two times a day (BID) | ORAL | 0 refills | Status: DC

## 2021-10-19 NOTE — Telephone Encounter (Signed)
Please advise 

## 2021-10-22 NOTE — Progress Notes (Signed)
Subjective: 71 year old male presents the office today for follow-up evaluation of pre-ulcerative calluses on his right foot.  He states that the wound on the right foot submetatarsal 1 has been getting worse and there is some bleeding.  He does get some mild discomfort.  He has new callus on the lateral aspect of the foot that he points to.  No swelling or redness or any drainage otherwise today reports.  Denies any fevers or chills.  He has no new concerns today.  He states he is scheduled to follow-up with [facility] for the Charcot.  Objective: AAO x3, NAD DP/PT pulses palpable bilaterally, CRT less than 3 seconds On the right foot plantar submetatarsal 1, hallux is hyperkeratotic tissue.  Upon debridement there is a granular wound noted.  See picture below.  There is no surrounding erythema, ascending cellulitis.  No fluctuation or crepitation and there is no malodor.  There is a new superficial callus noted to the fifth metatarsal base on the right foot.  No underlying ulceration drainage or signs of infection. No pain with calf compression, swelling, warmth, erythema     Assessment: Ulceration right foot submetatarsal 1 Plan: -All treatment options discussed with the patient including all alternatives, risks, complications.  -Sharply debrided the wound today utilizing a #312 with scalpel without any complications on the healthy, granular tissue.  I was able to debride nonviable devitalized tissue in order to promote wound healing.  No blood loss.  Tolerated well.  Prior to debridement not able to identify the wound given the callus formation.  Prescribed Silvadene as well as doxycycline. -Debrided the callus without any complications or bleeding. -He follows with [facility] for the Charcot so I will defer to them about the this.  -Monitor for any clinical signs or symptoms of infection and directed to call the office immediately should any occur or go to the ER.  *X-ray of right foot next appointment  if ulcer is still present.  Trula Slade DPM

## 2021-11-02 ENCOUNTER — Ambulatory Visit (INDEPENDENT_AMBULATORY_CARE_PROVIDER_SITE_OTHER): Payer: Medicare Other | Admitting: Podiatry

## 2021-11-02 ENCOUNTER — Encounter: Payer: Self-pay | Admitting: Podiatry

## 2021-11-02 ENCOUNTER — Other Ambulatory Visit: Payer: Self-pay

## 2021-11-02 ENCOUNTER — Ambulatory Visit (INDEPENDENT_AMBULATORY_CARE_PROVIDER_SITE_OTHER): Payer: Medicare Other

## 2021-11-02 DIAGNOSIS — L97512 Non-pressure chronic ulcer of other part of right foot with fat layer exposed: Secondary | ICD-10-CM | POA: Diagnosis not present

## 2021-11-07 NOTE — Progress Notes (Signed)
Subjective: 71 year old male presents the office today for follow-up evaluation of an ulcer to the bottom of his right foot submetatarsal 1.  He states he is doing much better.  He has been applying Silvadene.  Denies any drainage or pus or any increase in swelling or redness.  No fevers or chills.  No other concerns.  Objective: AAO x3, NAD DP/PT pulses palpable bilaterally, CRT less than 3 seconds On the right foot plantar submetatarsal 1 ulceration noted with some granular wound base with hyperkeratotic periwound.  Upon debridement only small superficial opening is noted.  There is no probing, undermining or tunneling.  No surrounding erythema, ascending cellulitis.  No fluctuation or crepitation but there is no malodor.  No signs of infection noted today.  No pain with calf compression, swelling, warmth, erythema  Assessment: Ulceration right foot submetatarsal 1-improving  Plan: -All treatment options discussed with the patient including all alternatives, risks, complications.  -X-rays obtained reviewed.  Chronic Charcot changes are present with hardware present from prior surgery.  There is no evidence of acute fracture, osteomyelitis.  No soft tissue edema. -Sharply debrided the hyperkeratotic tissue, ulceration utilizing a #312 with scalpel without any complications and healthy, granular tissue.  There is no blood loss.  He tolerated well.  I cleansed the wound with saline.  Saline was applied followed by dressing.   -Monitor for any clinical signs or symptoms of infection and directed to call the office immediately should any occur or go to the [place] DPM

## 2021-11-23 ENCOUNTER — Ambulatory Visit (INDEPENDENT_AMBULATORY_CARE_PROVIDER_SITE_OTHER): Payer: Medicare Other | Admitting: Podiatry

## 2021-11-23 ENCOUNTER — Other Ambulatory Visit: Payer: Self-pay

## 2021-11-23 DIAGNOSIS — L84 Corns and callosities: Secondary | ICD-10-CM | POA: Diagnosis not present

## 2021-11-23 DIAGNOSIS — L97512 Non-pressure chronic ulcer of other part of right foot with fat layer exposed: Secondary | ICD-10-CM

## 2021-11-23 DIAGNOSIS — M14679 Charcot's joint, unspecified ankle and foot: Secondary | ICD-10-CM | POA: Diagnosis not present

## 2021-11-24 DIAGNOSIS — R350 Frequency of micturition: Secondary | ICD-10-CM | POA: Diagnosis not present

## 2021-11-24 DIAGNOSIS — N401 Enlarged prostate with lower urinary tract symptoms: Secondary | ICD-10-CM | POA: Diagnosis not present

## 2021-11-24 DIAGNOSIS — R3915 Urgency of urination: Secondary | ICD-10-CM | POA: Diagnosis not present

## 2021-11-25 NOTE — Progress Notes (Signed)
Subjective: ?71 year old male presents the office today for follow-up evaluation of an ulcer to the bottom of his right foot submetatarsal 1.  He states they are doing better.  Not see any swelling or redness or any drainage coming from the areas.  He has no fevers or chills.  He has no other concerns.  ? ?Objective: ?AAO x3, NAD ?DP/PT pulses palpable bilaterally, CRT less than 3 seconds ?On the right foot plantar submetatarsal 1 as well as along the hallux is hyperkeratotic tissue with some dried blood underneath the callus.  After debridement there is no underlying ulceration drainage or any signs of infection digits.  No edema, erythema. ?No pain with calf compression, swelling, warmth, erythema ? ?Assessment: ?Ulceration right foot submetatarsal 1-heeled ? ?Plan: ?-All treatment options discussed with the patient including all alternatives, risks, complications.  ?-Sharply debrided the hyperkeratotic lesions with any complications or bleeding.  Recommend moisturizer and continue with offloading.  Monitor for any reoccurrence of the wounds or any signs or symptoms of infection. ?-Continue follow-up with [facility] for Charcot ? ?Return in about 4 weeks (around 12/21/2021). ? ?Trula Slade DPM ?

## 2021-11-27 DIAGNOSIS — R600 Localized edema: Secondary | ICD-10-CM | POA: Diagnosis not present

## 2021-11-27 DIAGNOSIS — R0602 Shortness of breath: Secondary | ICD-10-CM | POA: Diagnosis not present

## 2021-11-27 DIAGNOSIS — M25559 Pain in unspecified hip: Secondary | ICD-10-CM | POA: Diagnosis not present

## 2021-11-27 DIAGNOSIS — I10 Essential (primary) hypertension: Secondary | ICD-10-CM | POA: Diagnosis not present

## 2021-11-27 DIAGNOSIS — M14679 Charcot's joint, unspecified ankle and foot: Secondary | ICD-10-CM | POA: Diagnosis not present

## 2021-11-27 DIAGNOSIS — G894 Chronic pain syndrome: Secondary | ICD-10-CM | POA: Diagnosis not present

## 2021-11-27 DIAGNOSIS — N1831 Chronic kidney disease, stage 3a: Secondary | ICD-10-CM | POA: Diagnosis not present

## 2021-11-27 DIAGNOSIS — F330 Major depressive disorder, recurrent, mild: Secondary | ICD-10-CM | POA: Diagnosis not present

## 2021-12-17 DIAGNOSIS — H26493 Other secondary cataract, bilateral: Secondary | ICD-10-CM | POA: Diagnosis not present

## 2021-12-21 ENCOUNTER — Ambulatory Visit: Payer: Medicare Other | Admitting: Podiatry

## 2021-12-22 ENCOUNTER — Ambulatory Visit (INDEPENDENT_AMBULATORY_CARE_PROVIDER_SITE_OTHER): Payer: Medicare Other

## 2021-12-22 ENCOUNTER — Ambulatory Visit (INDEPENDENT_AMBULATORY_CARE_PROVIDER_SITE_OTHER): Payer: Medicare Other | Admitting: Podiatry

## 2021-12-22 DIAGNOSIS — L02611 Cutaneous abscess of right foot: Secondary | ICD-10-CM

## 2021-12-22 DIAGNOSIS — L84 Corns and callosities: Secondary | ICD-10-CM

## 2021-12-22 DIAGNOSIS — L97512 Non-pressure chronic ulcer of other part of right foot with fat layer exposed: Secondary | ICD-10-CM | POA: Diagnosis not present

## 2021-12-22 DIAGNOSIS — L03031 Cellulitis of right toe: Secondary | ICD-10-CM | POA: Diagnosis not present

## 2021-12-22 MED ORDER — AMOXICILLIN-POT CLAVULANATE 875-125 MG PO TABS: 1.0000 | ORAL_TABLET | Freq: Two times a day (BID) | ORAL | 0 refills | Status: DC

## 2021-12-23 LAB — CBC WITH DIFFERENTIAL
Basophils Absolute: 0.1 10*3/uL (ref 0.0–0.2)
Basos: 1 %
EOS (ABSOLUTE): 0.5 10*3/uL — ABNORMAL HIGH (ref 0.0–0.4)
Eos: 5 %
Hematocrit: 39.1 % (ref 37.5–51.0)
Hemoglobin: 13.4 g/dL (ref 13.0–17.7)
Immature Grans (Abs): 0 10*3/uL (ref 0.0–0.1)
Immature Granulocytes: 0 %
Lymphocytes Absolute: 2.1 10*3/uL (ref 0.7–3.1)
Lymphs: 22 %
MCH: 30.9 pg (ref 26.6–33.0)
MCHC: 34.3 g/dL (ref 31.5–35.7)
MCV: 90 fL (ref 79–97)
Monocytes Absolute: 1 10*3/uL — ABNORMAL HIGH (ref 0.1–0.9)
Monocytes: 11 %
Neutrophils Absolute: 6 10*3/uL (ref 1.4–7.0)
Neutrophils: 61 %
RBC: 4.33 x10E6/uL (ref 4.14–5.80)
RDW: 12.8 % (ref 11.6–15.4)
WBC: 9.8 10*3/uL (ref 3.4–10.8)

## 2021-12-23 LAB — COMPREHENSIVE METABOLIC PANEL
ALT: 16 IU/L (ref 0–44)
AST: 15 IU/L (ref 0–40)
Albumin/Globulin Ratio: 1.6 (ref 1.2–2.2)
Albumin: 4.2 g/dL (ref 3.8–4.8)
Alkaline Phosphatase: 90 IU/L (ref 44–121)
BUN/Creatinine Ratio: 18 (ref 10–24)
BUN: 23 mg/dL (ref 8–27)
Bilirubin Total: 0.4 mg/dL (ref 0.0–1.2)
CO2: 22 mmol/L (ref 20–29)
Calcium: 9.3 mg/dL (ref 8.6–10.2)
Chloride: 108 mmol/L — ABNORMAL HIGH (ref 96–106)
Creatinine, Ser: 1.26 mg/dL (ref 0.76–1.27)
Globulin, Total: 2.7 g/dL (ref 1.5–4.5)
Glucose: 86 mg/dL (ref 70–99)
Potassium: 4.6 mmol/L (ref 3.5–5.2)
Sodium: 146 mmol/L — ABNORMAL HIGH (ref 134–144)
Total Protein: 6.9 g/dL (ref 6.0–8.5)
eGFR: 62 mL/min/{1.73_m2} (ref >59)

## 2021-12-23 LAB — SEDIMENTATION RATE: Sed Rate: 14 mm/hr (ref 0–30)

## 2021-12-23 LAB — C-REACTIVE PROTEIN: CRP: 8 mg/L (ref 0–10)

## 2021-12-28 NOTE — Progress Notes (Signed)
Subjective: ?71 year old male presents the office today originally for follow-up evaluation of an ulcer on the bottom of his right foot.  However upon coming in today he noticed his right second toe been swollen and red.  He is not sure of any injury or what started this.  He has not seen any drainage or pus.  Denies any fevers or chills.  Has no other concerns today. ? ? ?Objective: ?AAO x3, NAD ?DP/PT pulses palpable bilaterally, CRT less than 3 seconds ?Sensation decreased with [place] the right foot.   ?Minimal hyperkeratotic tissue present submetatarsal 1 on the right foot.  The main concern today is a thick hyperkeratotic tissue the distal aspect of right second toe which is also having a blister adjacent to the skin is localized edema and erythema present in the toe.  No ascending cellulitis.  After I debrided the hyperkeratotic tissue there is a granular wound present which probes close to bone but not to the bone.  There is no exposed bone or tendon.  There is no fluctuation or crepitation.  There is no malodor.  No erythema extending past the MPJ.  No pain with calf compression, swelling, warmth, erythema ? ?Assessment: ?Ulceration, cellulitis right second toe ? ?Plan: ?-All treatment options discussed with the patient including all alternatives, risks, complications.  ?-X-rays were obtained and reviewed of the right foot.  3 views were obtained and reviewed.  No definitive evidence of acute osteomyelitis at this time.  Hardware from prior surgery present. ?-Sharply debrided the hyperkeratotic tissue, wound on the right second toe utilizing #312 with scalpel.  I was able to visualize the wound prior to debridement however after debridement the wound measured 0.7 x 0.5 x 0.3 cm.  Debride the wound and healthy, granular tissue.  No significant blood loss.  Tolerated well.  Silvadene was applied followed by dressing. ?-Surgical shoe for offloading ?-Blood work ordered including sed rate, CRP, CMP,  CBC ?-Augmentin ?-Silvadene dressing changes daily ?-Monitor for any clinical signs or symptoms of infection and directed to call the office immediately should any occur or go to the ER.  Unfortunately is at risk of toe potation which we discussed today. ?-Patient encouraged to call the office with any questions, concerns, change in symptoms.  ? ?Trula Slade DPM ? ?

## 2021-12-31 ENCOUNTER — Ambulatory Visit (INDEPENDENT_AMBULATORY_CARE_PROVIDER_SITE_OTHER): Payer: Medicare Other | Admitting: Podiatry

## 2021-12-31 DIAGNOSIS — L03031 Cellulitis of right toe: Secondary | ICD-10-CM

## 2021-12-31 DIAGNOSIS — L97512 Non-pressure chronic ulcer of other part of right foot with fat layer exposed: Secondary | ICD-10-CM | POA: Diagnosis not present

## 2021-12-31 DIAGNOSIS — L02611 Cutaneous abscess of right foot: Secondary | ICD-10-CM

## 2022-01-03 NOTE — Progress Notes (Signed)
Subjective: ?71 year old male presents the office today originally for follow-up evaluation of an ulcer at the distal aspect the right second toe.  His wife accompanies him today.  He has not seen any drainage or pus and swelling the redness has improved to the toe.  He has no fevers or chills currently he has no other concerns today.  ? ?Objective: ?AAO x3, NAD ?DP/PT pulses palpable bilaterally, CRT less than 3 seconds ?Pitting edema present bilaterally. ?Ulceration noted to the distal aspect the right second toe.  The wound measures 0.5 x 0.4 x 0.1 cm.  Pre and post wound measurements are the same.  Hyperkeratotic periwound.  Has agreed the wound base with any probing, undermining or tunneling.  There is no fluctuation or crepitation.  There is still some minimal edema to the toe but there is no significant erythema or warmth.  There is no fluctuation or crepitation.  There is no malodor.  Hammertoe contractures present. ?No pain with calf compression, swelling, warmth, erythema ? ? ?Assessment: ?Ulceration, resolving cellulitis right second toe ? ?Plan: ?-All treatment options discussed with the patient including all alternatives, risks, complications.  ?-Sharply debrided the hyperkeratotic tissue, wound on the right second toe utilizing #312 with scalpel.  I was able to debride the wound down to healthy, granular tissue to move the hyperkeratotic periwound.  There is minimal bleeding of the wound base and hemostasis sheath or manual compression.  Wound was cleaned with saline.  Silvadene was applied followed by dressing.  He tolerated procedure well any complications.  Recommend continue with daily dressing changes with Silvadene for now. ?-Surgical shoe for offloading ?-Blood work reviewed.  Sed rate 14, CRP 8, white blood cell count 9.8. ?-Finish course of antibiotics, Augmentin ?-Regards the swelling could be infection but I think it is more fluid.  Recommend follow-up this primary care doctor, Dr. Velna Hatchet. He is already on Lasix.  ?-Monitor for any clinical signs or symptoms of infection and directed to call the office immediately should any occur or go to the ER.  Unfortunately is at risk of toe potation which we discussed today. ?-Patient encouraged to call the office with any questions, concerns, change in symptoms.  ? ?Return in about 10 days (around 01/10/2022).  If ulcer still present repeat x-ray ? ?Trula Slade DPM ? ?

## 2022-01-11 ENCOUNTER — Ambulatory Visit (INDEPENDENT_AMBULATORY_CARE_PROVIDER_SITE_OTHER): Payer: Medicare Other | Admitting: Podiatry

## 2022-01-11 ENCOUNTER — Ambulatory Visit (INDEPENDENT_AMBULATORY_CARE_PROVIDER_SITE_OTHER): Payer: Medicare Other

## 2022-01-11 DIAGNOSIS — L97512 Non-pressure chronic ulcer of other part of right foot with fat layer exposed: Secondary | ICD-10-CM
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
## 2022-01-12 ENCOUNTER — Telehealth: Payer: Self-pay | Admitting: *Deleted

## 2022-01-12 ENCOUNTER — Other Ambulatory Visit: Payer: Self-pay | Admitting: Internal Medicine

## 2022-01-12 ENCOUNTER — Other Ambulatory Visit: Payer: Self-pay | Admitting: Podiatry

## 2022-01-12 MED ORDER — AMOXICILLIN-POT CLAVULANATE 875-125 MG PO TABS: 1.0000 | ORAL_TABLET | Freq: Two times a day (BID) | ORAL | 0 refills | Status: DC

## 2022-01-12 NOTE — Telephone Encounter (Signed)
Patient's wife is calling for status of an anbiotic that was supposed to be sent to pharmacy after visit 1 day ago,not at pharmacy. Please advise. ?

## 2022-01-12 NOTE — Telephone Encounter (Signed)
That was my fault.  I guess I did not send it in.  I just sent the prescription in.  Thanks, Dr. Amalia Hailey

## 2022-01-12 NOTE — Telephone Encounter (Signed)
Called patient and notified thru vmessage

## 2022-01-21 ENCOUNTER — Ambulatory Visit: Payer: Medicare Other | Admitting: Podiatry

## 2022-01-21 ENCOUNTER — Ambulatory Visit (INDEPENDENT_AMBULATORY_CARE_PROVIDER_SITE_OTHER): Payer: Medicare Other | Admitting: Podiatry

## 2022-01-21 DIAGNOSIS — M2041 Other hammer toe(s) (acquired), right foot: Secondary | ICD-10-CM | POA: Diagnosis not present

## 2022-01-21 DIAGNOSIS — L97512 Non-pressure chronic ulcer of other part of right foot with fat layer exposed: Secondary | ICD-10-CM | POA: Diagnosis not present

## 2022-01-23 NOTE — Progress Notes (Signed)
Subjective: ?71 year old male presents the office today for follow-up evaluation of wound in the right second toe.  He is complete the course of antibiotics.  Swelling and redness is much improved.  He does keep to Band-Aid on the toe and change the dressing daily.  Denies any fevers or chills.  No drainage or pus. ? ?Objective: ?AAO x3, NAD ?DP/PT pulses palpable bilaterally, CRT less than 3 seconds ?Pitting edema present bilaterally. ?Ulceration noted to the distal aspect the right second toe.  The wound measures 0.4 x 0.3 x 0.1 cm.  Pre and post wound measurements are the same.  Minimal hyperkeratotic periwound.  Wound base is granular.  There is no probing, undermining or tunneling.  No fluctuation or crepitation is noted.  There is some mild residual edema and erythema present distal aspect the toe but appears to be a darker red in color and not that bright red cellulitis.  Hammertoes present. ?No pain with calf compression, swelling, warmth, erythema ? ?Assessment: ?Ulceration, resolving cellulitis right second toe-improving ? ?Plan: ?-All treatment options discussed with the patient including all alternatives, risks, complications.  ?-Lightly debrided the wound today with there was minimal hyperkeratotic tissue.  I recommend continue with daily dressing changes and offloading.  I do think adhesive may be causing some irritation to the skin that should have been about the toe to help with pressure.  Continue surgical shoe for offloading.  I dispensed offloading pad to help offload the second toe.  He has a cam boot at home that he is unaware of, also order a foot Conservation officer, nature.  He is to continue Silvadene for now I will also order collagen, BlastX to apply to the wound. I gave the information to Isidore Moos and we will fax over the prescription.  ?-We did discuss flexor tenotomy given the hammertoe to help decrease pressure. ?-Monitor for any clinical signs or symptoms of infection and directed to call the  office immediately should any occur or go to the ER. ? ?Return in about 2 weeks (around 02/04/2022). ? ?Trula Slade DPM ? ?

## 2022-01-25 ENCOUNTER — Telehealth: Payer: Self-pay | Admitting: Podiatry

## 2022-01-25 NOTE — Telephone Encounter (Signed)
Patient wife called and stated her husband wears a size 14 in shoes. She stated that some type of boot was suppose to be ordered. ? ?Please advise   ?

## 2022-02-02 ENCOUNTER — Encounter: Payer: Self-pay | Admitting: Internal Medicine

## 2022-02-05 ENCOUNTER — Ambulatory Visit (INDEPENDENT_AMBULATORY_CARE_PROVIDER_SITE_OTHER): Payer: Medicare Other | Admitting: Podiatry

## 2022-02-05 DIAGNOSIS — L97512 Non-pressure chronic ulcer of other part of right foot with fat layer exposed: Secondary | ICD-10-CM | POA: Diagnosis not present

## 2022-02-05 DIAGNOSIS — M2041 Other hammer toe(s) (acquired), right foot: Secondary | ICD-10-CM

## 2022-02-05 DIAGNOSIS — T148XXA Other injury of unspecified body region, initial encounter: Secondary | ICD-10-CM

## 2022-02-08 NOTE — Progress Notes (Signed)
Subjective: 71 year old male presents the office today for follow-up evaluation of wound in the right second toe.  He is continue with the collagen powder.  Has been using the foot defender boot but has been adding the insert inside the boot he states for stability.  He has noticed a blister on the top of his big toe no current drainage or pus.  Not sure how this started.  The bandage does not seem to rub his wife states.  He has no fevers or chills.  No other concerns.   Objective: AAO x3, NAD DP/PT pulses palpable bilaterally, CRT less than 3 seconds Pitting edema present bilaterally. Ulceration noted to the distal aspect the right second toe.  The wound measures 0.3 x 0.3 x 0.1 cm.  Pre and post wound measurements are the same.  Hyperkeratotic periwound.  Wound base is granular.  There is no probing, undermining or tunneling.  No fluctuation or crepitation is noted.  There is some mild residual edema but no significant cellulitis noted.  Flat blister present on the dorsal aspect the hallux without any erythema or warmth. No pain with calf compression, swelling, warmth, erythema  Assessment: Ulceration, resolving cellulitis right second toe-improving  Plan: -All treatment options discussed with the patient including all alternatives, risks, complications.  -Sharply debrided the wound today with there was minimal hyperkeratotic tissue.  Clean the area.  Collagen was applied followed by dressing.  Continue with daily dressing changes.  I have contacted the rep and they are going to resend the blastx as it did not appear he received this.  I discussed how to apply this and given written instructions for this as well. -Continue the blister could be coming from the boot has an insert over top of the boot.  Recommended small mount of Betadine dressing. -Monitor for any clinical signs or symptoms of infection and directed to call the office immediately should any occur or go to the ER.  Return in about  2 weeks (around 02/19/2022).  Trula Slade DPM

## 2022-02-19 DIAGNOSIS — H5310 Unspecified subjective visual disturbances: Secondary | ICD-10-CM | POA: Diagnosis not present

## 2022-02-26 ENCOUNTER — Ambulatory Visit (INDEPENDENT_AMBULATORY_CARE_PROVIDER_SITE_OTHER): Payer: Medicare Other | Admitting: Podiatry

## 2022-02-26 DIAGNOSIS — L97311 Non-pressure chronic ulcer of right ankle limited to breakdown of skin: Secondary | ICD-10-CM | POA: Diagnosis not present

## 2022-02-26 DIAGNOSIS — L97512 Non-pressure chronic ulcer of other part of right foot with fat layer exposed: Secondary | ICD-10-CM | POA: Diagnosis not present

## 2022-02-26 DIAGNOSIS — M2041 Other hammer toe(s) (acquired), right foot: Secondary | ICD-10-CM | POA: Diagnosis not present
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
## 2022-03-01 ENCOUNTER — Ambulatory Visit (INDEPENDENT_AMBULATORY_CARE_PROVIDER_SITE_OTHER): Payer: Medicare Other | Admitting: Vascular Surgery

## 2022-03-01 ENCOUNTER — Other Ambulatory Visit: Payer: Self-pay

## 2022-03-01 ENCOUNTER — Ambulatory Visit: Payer: Medicare Other | Admitting: Podiatry

## 2022-03-01 ENCOUNTER — Encounter: Payer: Self-pay | Admitting: Vascular Surgery

## 2022-03-01 ENCOUNTER — Other Ambulatory Visit: Payer: Self-pay | Admitting: *Deleted

## 2022-03-01 VITALS — BP 120/68 | HR 55 | Temp 98.1°F | Resp 18 | Ht 75.0 in | Wt 244.0 lb

## 2022-03-01 DIAGNOSIS — I872 Venous insufficiency (chronic) (peripheral): Secondary | ICD-10-CM | POA: Diagnosis not present

## 2022-03-01 DIAGNOSIS — I712 Thoracic aortic aneurysm, without rupture, unspecified: Secondary | ICD-10-CM

## 2022-03-01 NOTE — Progress Notes (Signed)
Subjective: 71 year old male presents the office today for follow-up evaluation of wound in the right second toe.  He is continue with daily dressing changes and has been applying the collagen to the wound.  He has been using the foot defender boot however it did cause a small wound to the side of his ankle.  No drainage or pus.  No present swelling or redness.  No fevers or chills.  No other concerns.  Objective: AAO x3, NAD DP/PT pulses palpable bilaterally, CRT less than 3 seconds Pitting edema present bilaterally. Hyperkeratotic tissue present distal aspect of right second toe with some dried blood.  Upon debridement.  The wound is healed however is preulcerative.  There is no edema, erythema, signs of infection.  Small granular wound noted on the medial aspect ankle any probing, no tunneling.  No surrounding erythema, ascending cellulitis.  No fluctuance or crepitation but there is malodor. No pain with calf compression, swelling, warmth, erythema  Assessment: Ulceration, resolving cellulitis right second toe-improving  Plan: -All treatment options discussed with the patient including all alternatives, risks, complications.  -She will be debrided hyperkeratotic tissue distal second toe with any complications or bleeding.  Continue with daily dressing changes and we can use small antibiotic ointment for now. -For the wound on the medial ankle he has a follow-up scheduled with vascular surgery he reports.  Discussed using collagen on this.  Continue compression socks.  He can transition to regular shoe to avoid pressure. -Monitor for any clinical signs or symptoms of infection and directed to call the office immediately should any occur or go to the ER.  Trula Slade DPM

## 2022-03-02 ENCOUNTER — Ambulatory Visit (INDEPENDENT_AMBULATORY_CARE_PROVIDER_SITE_OTHER): Payer: Medicare Other | Admitting: Vascular Surgery

## 2022-03-02 ENCOUNTER — Ambulatory Visit (HOSPITAL_COMMUNITY)
Admission: RE | Admit: 2022-03-02 | Discharge: 2022-03-02 | Disposition: A | Discharge status: Home / Self Care | Payer: Medicare Other | Source: Ambulatory Visit | Attending: Vascular Surgery | Admitting: Vascular Surgery

## 2022-03-02 DIAGNOSIS — I872 Venous insufficiency (chronic) (peripheral): Secondary | ICD-10-CM

## 2022-03-02 NOTE — Progress Notes (Signed)
ABI reviewed - unremarkable. Normal flow to the toes and ankle. Unna boot placed in clinic.  Home health ordered for weekly changes. Follow up with me in 1 month for wound check.  Yevonne Aline. Stanford Breed, MD [facility] Phone Number: [phone] 03/02/2022 4:00 PM

## 2022-03-03 ENCOUNTER — Encounter: Payer: Self-pay | Admitting: Internal Medicine

## 2022-03-03 ENCOUNTER — Other Ambulatory Visit: Payer: Self-pay

## 2022-03-03 DIAGNOSIS — I872 Venous insufficiency (chronic) (peripheral): Secondary | ICD-10-CM

## 2022-03-04 ENCOUNTER — Ambulatory Visit
Admission: RE | Admit: 2022-03-04 | Discharge: 2022-03-04 | Disposition: A | Discharge status: Home / Self Care | Payer: Medicare Other | Source: Ambulatory Visit | Attending: Vascular Surgery | Admitting: Vascular Surgery

## 2022-03-04 DIAGNOSIS — I712 Thoracic aortic aneurysm, without rupture, unspecified: Secondary | ICD-10-CM | POA: Diagnosis not present

## 2022-03-04 DIAGNOSIS — I2510 Atherosclerotic heart disease of native coronary artery without angina pectoris: Secondary | ICD-10-CM | POA: Diagnosis not present

## 2022-03-04 DIAGNOSIS — R911 Solitary pulmonary nodule: Secondary | ICD-10-CM | POA: Diagnosis not present

## 2022-03-04 IMAGING — CT CT ANGIO CHEST
1 series · 14 of 32 positions shown · IV contrast (agent unspecified)
Comparison: [DATE] [DATE], [DATE].  [DATE] [DATE], [DATE].

CLINICAL DATA: Thoracic aortic aneurysm.

EXAM:
CT ANGIOGRAPHY CHEST WITH CONTRAST
TECHNIQUE: Multidetector CT imaging of the chest was performed using the
standard protocol during bolus administration of intravenous
contrast. Multiplanar CT image reconstructions and MIPs were
obtained to evaluate the vascular anatomy.

[Series 14: cta thorax 2.00 bv36 s3 cor st · coronal · 0.65mm/px · 14 of 162 slices shown]
[im 12/162  lung]
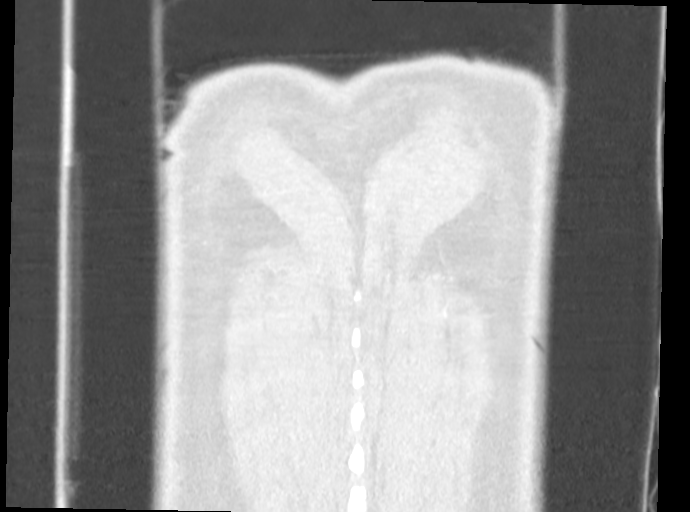
[im 24/162  mediastinal]
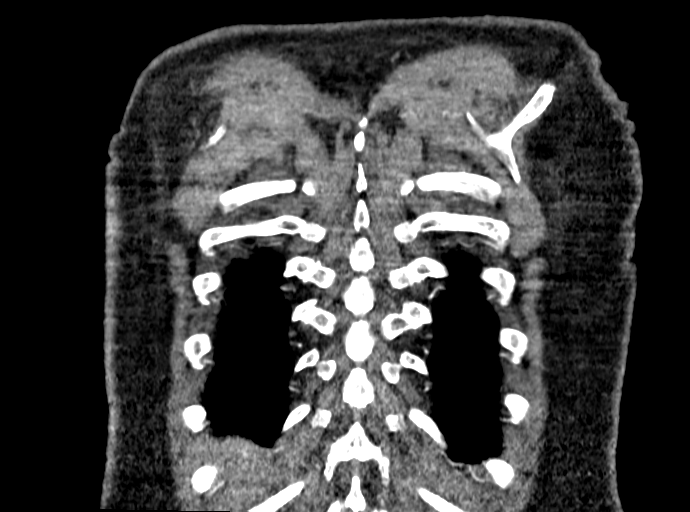
[im 33/162  lung]
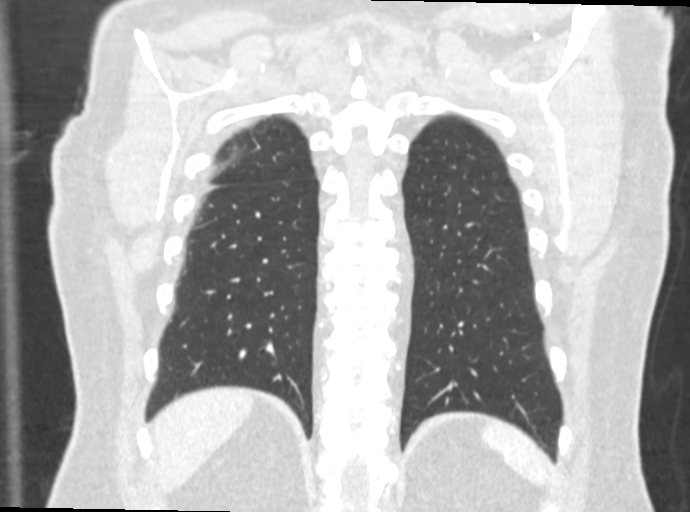
[im 42/162  mediastinal]
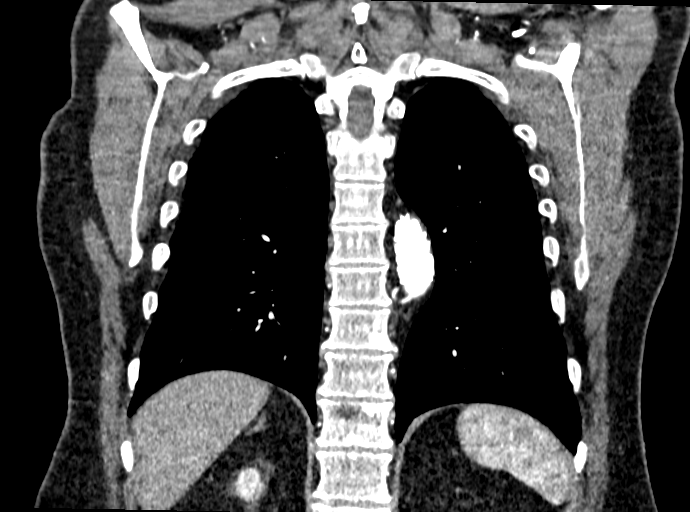
[im 54/162  lung]
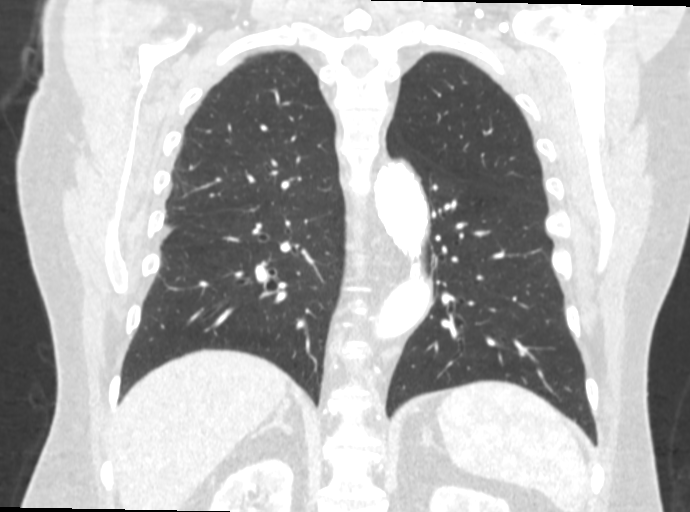
[im 65/162  mediastinal]
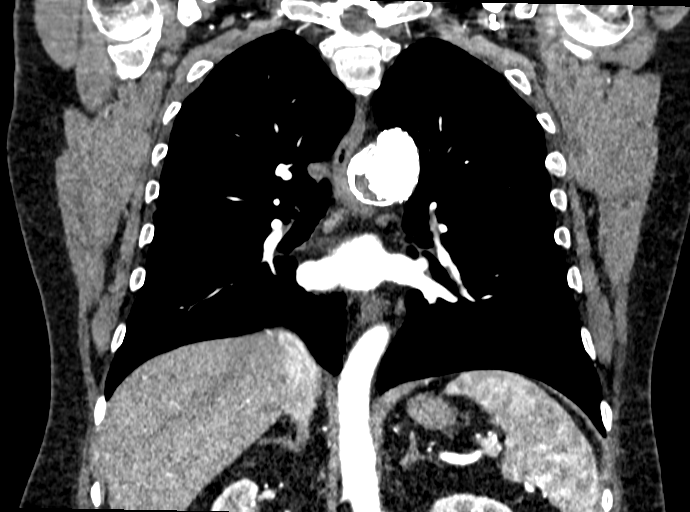
[im 72/162  lung]
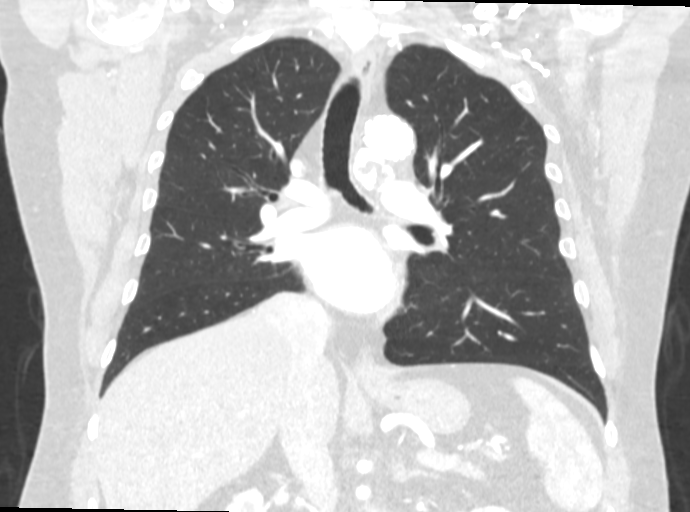
[im 90/162  mediastinal]
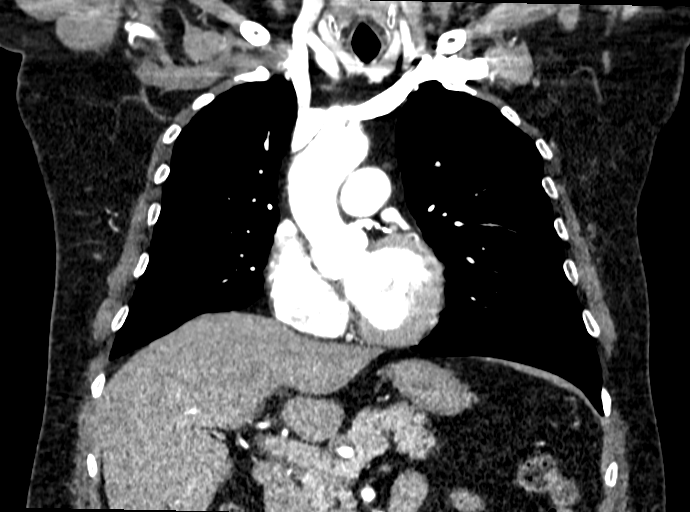
[im 97/162  lung]
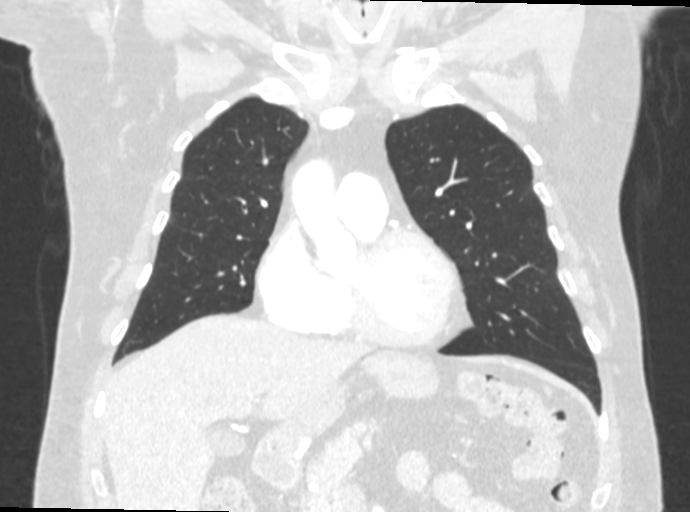
[im 108/162  mediastinal]
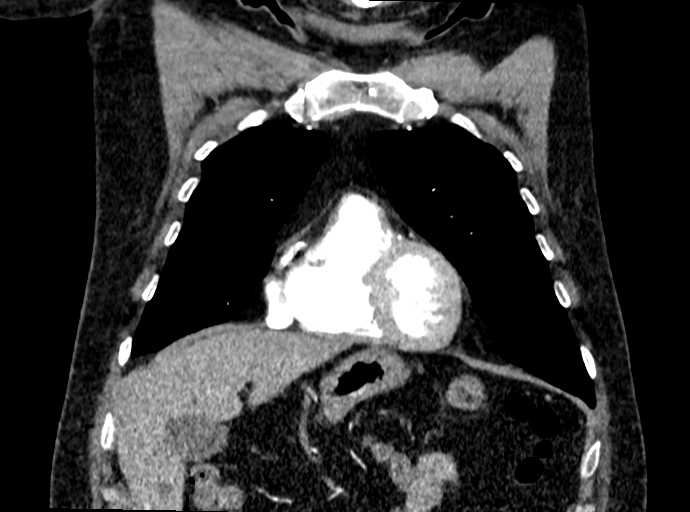
[im 120/162  lung]
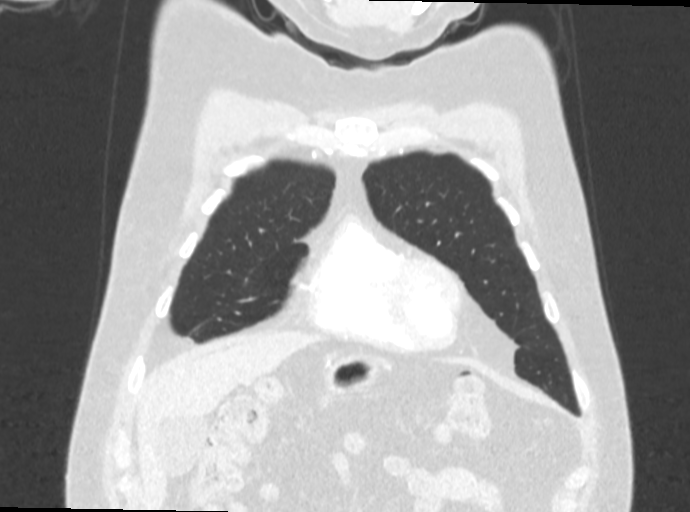
[im 129/162  mediastinal]
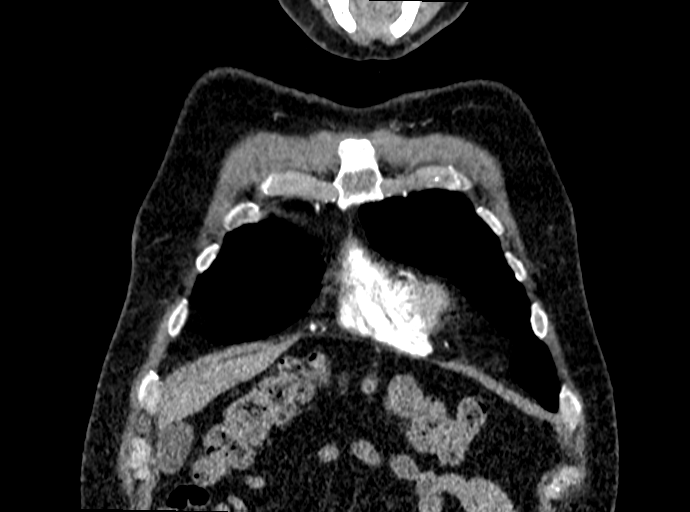
[im 138/162  lung]
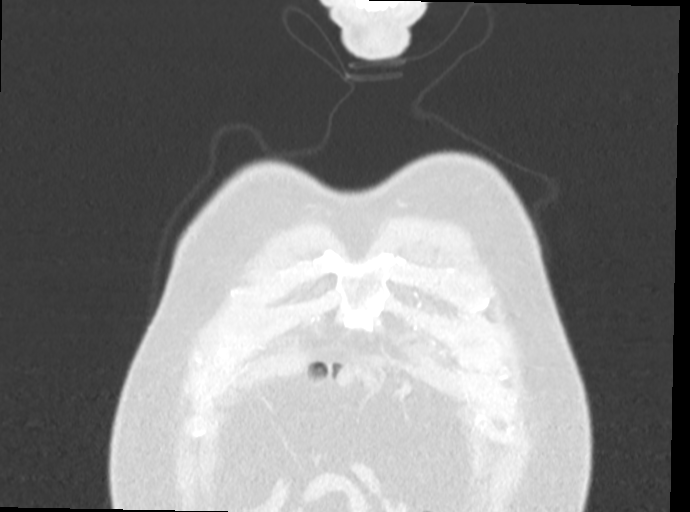
[im 150/162  mediastinal]
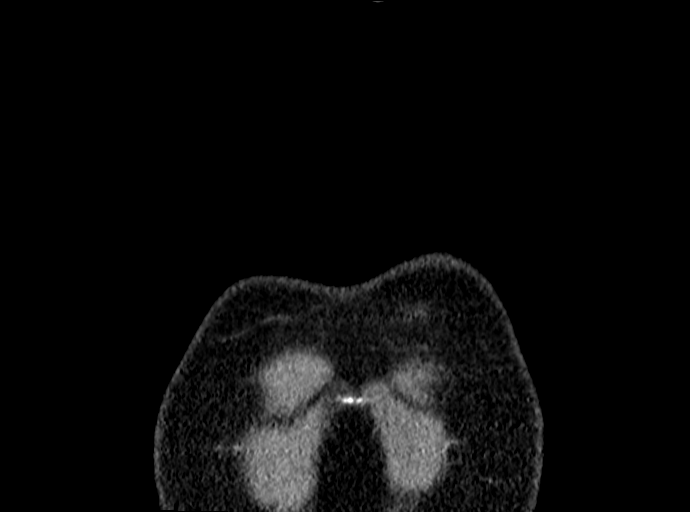

[14 of 32 positions shown; findings below may reference images not displayed]

RADIATION DOSE REDUCTION: This exam was performed according to the
departmental dose-optimization program which includes automated
exposure control, adjustment of the mA and/or kV according to
patient size and/or use of iterative reconstruction technique.

CONTRAST:  75mL SDTFVW-RFR IOPAMIDOL (SDTFVW-RFR) INJECTION 76%
FINDINGS: Cardiovascular: Stable appearance stent graft extending from origin
of left subclavian artery into the midportion of descending thoracic
aorta. Stable appearance of excluded saccular aneurysm involving the
proximal descending thoracic aorta measuring 4.4 cm in maximum
diameter. No definite endoleak is noted. Normal cardiac size. No
pericardial effusion. Coronary artery calcifications are noted.

Mediastinum/Nodes: No enlarged mediastinal, hilar, or axillary lymph
nodes. Thyroid gland, trachea, and esophagus demonstrate no
significant findings.

Lungs/Pleura: Stable 6 mm right upper lobe nodule is noted which can
be considered benign at this point. No acute abnormality is noted.
No pleural effusion or pneumothorax.

Upper Abdomen: No acute abnormality.

Musculoskeletal: No chest wall abnormality. No acute or significant
osseous findings.

Review of the MIP images confirms the above findings.
IMPRESSION: Stable appearance of stent graft extending from origin of left
subclavian artery to the midportion of descending thoracic aorta.
Stable appearance of excluded saccular aneurysm involving the
proximal descending thoracic aorta.

Coronary artery calcifications are noted.

## 2022-03-04 MED ORDER — IOPAMIDOL (ISOVUE-370) INJECTION 76%
75.0000 mL | Freq: Once | INTRAVENOUS | Status: AC | PRN
  Administered 2022-03-04: 75 mL via INTRAVENOUS

## 2022-03-08 ENCOUNTER — Ambulatory Visit (AMBULATORY_SURGERY_CENTER): Payer: Self-pay | Admitting: *Deleted

## 2022-03-08 VITALS — Ht 75.0 in | Wt 247.4 lb

## 2022-03-08 DIAGNOSIS — Z86010 Personal history of colonic polyps: Secondary | ICD-10-CM

## 2022-03-09 DIAGNOSIS — M14671 Charcot's joint, right ankle and foot: Secondary | ICD-10-CM | POA: Diagnosis not present

## 2022-03-09 DIAGNOSIS — L97309 Non-pressure chronic ulcer of unspecified ankle with unspecified severity: Secondary | ICD-10-CM | POA: Diagnosis not present

## 2022-03-09 DIAGNOSIS — Q666 Other congenital valgus deformities of feet: Secondary | ICD-10-CM | POA: Diagnosis not present

## 2022-03-09 DIAGNOSIS — M7730 Calcaneal spur, unspecified foot: Secondary | ICD-10-CM | POA: Diagnosis not present

## 2022-03-09 DIAGNOSIS — M19071 Primary osteoarthritis, right ankle and foot: Secondary | ICD-10-CM | POA: Diagnosis not present

## 2022-03-09 DIAGNOSIS — G629 Polyneuropathy, unspecified: Secondary | ICD-10-CM | POA: Diagnosis not present

## 2022-03-09 DIAGNOSIS — M205X1 Other deformities of toe(s) (acquired), right foot: Secondary | ICD-10-CM | POA: Diagnosis not present

## 2022-03-09 DIAGNOSIS — E11622 Type 2 diabetes mellitus with other skin ulcer: Secondary | ICD-10-CM | POA: Diagnosis not present

## 2022-03-09 DIAGNOSIS — Z981 Arthrodesis status: Secondary | ICD-10-CM | POA: Diagnosis not present

## 2022-03-09 DIAGNOSIS — M86171 Other acute osteomyelitis, right ankle and foot: Secondary | ICD-10-CM | POA: Diagnosis not present

## 2022-03-10 DIAGNOSIS — S069XAD Unspecified intracranial injury with loss of consciousness status unknown, subsequent encounter: Secondary | ICD-10-CM | POA: Diagnosis not present

## 2022-03-10 DIAGNOSIS — I872 Venous insufficiency (chronic) (peripheral): Secondary | ICD-10-CM | POA: Diagnosis not present

## 2022-03-10 DIAGNOSIS — L97319 Non-pressure chronic ulcer of right ankle with unspecified severity: Secondary | ICD-10-CM | POA: Diagnosis not present

## 2022-03-10 DIAGNOSIS — L97519 Non-pressure chronic ulcer of other part of right foot with unspecified severity: Secondary | ICD-10-CM | POA: Diagnosis not present

## 2022-03-10 DIAGNOSIS — M19071 Primary osteoarthritis, right ankle and foot: Secondary | ICD-10-CM | POA: Diagnosis not present

## 2022-03-10 DIAGNOSIS — F419 Anxiety disorder, unspecified: Secondary | ICD-10-CM | POA: Diagnosis not present

## 2022-03-10 DIAGNOSIS — G629 Polyneuropathy, unspecified: Secondary | ICD-10-CM | POA: Diagnosis not present

## 2022-03-10 DIAGNOSIS — R41844 Frontal lobe and executive function deficit: Secondary | ICD-10-CM | POA: Diagnosis not present

## 2022-03-10 DIAGNOSIS — I10 Essential (primary) hypertension: Secondary | ICD-10-CM | POA: Diagnosis not present

## 2022-03-10 DIAGNOSIS — E785 Hyperlipidemia, unspecified: Secondary | ICD-10-CM | POA: Diagnosis not present

## 2022-03-10 DIAGNOSIS — F909 Attention-deficit hyperactivity disorder, unspecified type: Secondary | ICD-10-CM | POA: Diagnosis not present

## 2022-03-10 DIAGNOSIS — E7800 Pure hypercholesterolemia, unspecified: Secondary | ICD-10-CM | POA: Diagnosis not present

## 2022-03-10 DIAGNOSIS — Z792 Long term (current) use of antibiotics: Secondary | ICD-10-CM | POA: Diagnosis not present

## 2022-03-22 DIAGNOSIS — I10 Essential (primary) hypertension: Secondary | ICD-10-CM | POA: Diagnosis not present

## 2022-03-22 DIAGNOSIS — M14679 Charcot's joint, unspecified ankle and foot: Secondary | ICD-10-CM | POA: Diagnosis not present

## 2022-03-22 DIAGNOSIS — R0602 Shortness of breath: Secondary | ICD-10-CM | POA: Diagnosis not present

## 2022-03-22 DIAGNOSIS — F330 Major depressive disorder, recurrent, mild: Secondary | ICD-10-CM | POA: Diagnosis not present

## 2022-03-22 DIAGNOSIS — N1831 Chronic kidney disease, stage 3a: Secondary | ICD-10-CM | POA: Diagnosis not present

## 2022-03-22 DIAGNOSIS — R600 Localized edema: Secondary | ICD-10-CM | POA: Diagnosis not present

## 2022-03-22 DIAGNOSIS — M25559 Pain in unspecified hip: Secondary | ICD-10-CM | POA: Diagnosis not present

## 2022-03-22 DIAGNOSIS — R7301 Impaired fasting glucose: Secondary | ICD-10-CM | POA: Diagnosis not present

## 2022-03-22 DIAGNOSIS — I712 Thoracic aortic aneurysm, without rupture, unspecified: Secondary | ICD-10-CM | POA: Diagnosis not present

## 2022-03-22 DIAGNOSIS — R2689 Other abnormalities of gait and mobility: Secondary | ICD-10-CM | POA: Diagnosis not present

## 2022-03-22 DIAGNOSIS — L97519 Non-pressure chronic ulcer of other part of right foot with unspecified severity: Secondary | ICD-10-CM | POA: Diagnosis not present

## 2022-03-22 DIAGNOSIS — G894 Chronic pain syndrome: Secondary | ICD-10-CM | POA: Diagnosis not present

## 2022-03-24 ENCOUNTER — Ambulatory Visit: Payer: Medicare Other | Admitting: Vascular Surgery

## 2022-03-26 ENCOUNTER — Ambulatory Visit: Payer: Medicare Other | Admitting: Podiatry

## 2022-03-26 DIAGNOSIS — M14671 Charcot's joint, right ankle and foot: Secondary | ICD-10-CM | POA: Diagnosis not present

## 2022-03-26 DIAGNOSIS — M2041 Other hammer toe(s) (acquired), right foot: Secondary | ICD-10-CM | POA: Diagnosis not present

## 2022-03-26 DIAGNOSIS — L97511 Non-pressure chronic ulcer of other part of right foot limited to breakdown of skin: Secondary | ICD-10-CM | POA: Diagnosis not present

## 2022-03-30 ENCOUNTER — Ambulatory Visit (INDEPENDENT_AMBULATORY_CARE_PROVIDER_SITE_OTHER): Payer: Medicare Other | Admitting: Vascular Surgery

## 2022-03-30 ENCOUNTER — Encounter: Payer: Self-pay | Admitting: Vascular Surgery

## 2022-03-30 ENCOUNTER — Encounter: Payer: Self-pay | Admitting: Internal Medicine

## 2022-03-30 ENCOUNTER — Ambulatory Visit: Payer: Medicare Other | Admitting: Vascular Surgery

## 2022-03-30 VITALS — BP 135/81 | HR 72 | Temp 98.1°F | Resp 20 | Ht 75.0 in | Wt 247.0 lb

## 2022-03-30 DIAGNOSIS — I872 Venous insufficiency (chronic) (peripheral): Secondary | ICD-10-CM
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
## 2022-03-31 ENCOUNTER — Ambulatory Visit: Payer: Medicare Other | Admitting: Vascular Surgery

## 2022-04-01 ENCOUNTER — Ambulatory Visit: Payer: Medicare Other | Admitting: Podiatry

## 2022-04-02 ENCOUNTER — Ambulatory Visit (AMBULATORY_SURGERY_CENTER): Payer: Medicare Other | Admitting: Internal Medicine

## 2022-04-02 ENCOUNTER — Encounter: Payer: Self-pay | Admitting: Internal Medicine

## 2022-04-02 VITALS — BP 123/75 | HR 58 | Temp 98.6°F | Resp 11 | Ht 75.0 in | Wt 247.4 lb

## 2022-04-02 DIAGNOSIS — D120 Benign neoplasm of cecum: Secondary | ICD-10-CM | POA: Diagnosis not present

## 2022-04-02 DIAGNOSIS — D123 Benign neoplasm of transverse colon: Secondary | ICD-10-CM | POA: Diagnosis not present

## 2022-04-02 DIAGNOSIS — Z86010 Personal history of colonic polyps: Secondary | ICD-10-CM | POA: Diagnosis not present

## 2022-04-02 DIAGNOSIS — Z09 Encounter for follow-up examination after completed treatment for conditions other than malignant neoplasm: Secondary | ICD-10-CM | POA: Diagnosis not present

## 2022-04-02 DIAGNOSIS — D124 Benign neoplasm of descending colon: Secondary | ICD-10-CM | POA: Diagnosis not present

## 2022-04-02 MED ORDER — SODIUM CHLORIDE 0.9 % IV SOLN: 500.0000 mL | Freq: Once | INTRAVENOUS | Status: DC

## 2022-04-02 NOTE — Progress Notes (Signed)
Called to room to assist during endoscopic procedure.  Patient ID and intended procedure confirmed with present staff. Received instructions for my participation in the procedure from the performing physician.  

## 2022-04-02 NOTE — Progress Notes (Signed)
[facility] History and Physical   Primary Care Physician:  Velna Hatchet, MD   Reason for Procedure:   Hx colon polyps  Plan:    colonoscopy     HPI: Trevor [name] is a 71 y.o. male here for polyp surveillance exam. Also fHx rectal cancer in elderly mother.  3 10 mm hyperplastic polyps removed from right and left colon 2004 4 polyps, one 10 mm others diminutive and hyperplastic 2007 Followed closer than routine risk due to multiple large hyperplastic polyps 5 adenomas with one of them serrated  05/29/2015 11 polyps max 10 mm adenomas, ssa's, hyperplastic - 09/21/2017 3 diminutive polyps removed adenomas  12/26/2019 11 diminutive polyps 6 adenomas 1 hyperplastic and rest normal mucosa recall 2023 Past Medical History:  Diagnosis Date   Acute pain of left shoulder 04/04/2017   ADD (attention deficit disorder)    Allergy    Anxiety    Arthritis    right foot   Arthritis of midfoot 04/29/2016   Overview:  Added automatically from request for surgery [id number]   Blood transfusion without reported diagnosis    Cataract    removed both eyes with lens implants    Chest wall pain 09/29/2017   Chronic pain of right ankle 04/08/2016   Cough due to ACE inhibitor 03/18/2017   Depression    Descending thoracic aortic aneurysm ([place]) 04/04/2017   Overview:  4.5 cm on CT scan of April 04, 2017.   Diverticulosis of colon 11/25/2015   Dyspnea on exertion 04/04/2017   Essential hypertension 11/25/2015   Executive function deficit 12/31/2015   Family history of colon cancer in mother    dx'd age late 18's   Foot pain, right 04/08/2016   Gait abnormality 03/29/2019   H/O blood clots    Health maintenance examination 09/29/2017   History of colonic polyps 02/04/2011   3 10 mm hyperplastic polyps removed from right and left colon 2004 4 polyps, one 10 mm others diminutive and hyperplastic 2007 Followed closer than routine risk due to multiple large hyperplastic polyps 5 adenomas with one of  them serrated  05/29/2015 11 polyps max 10 mm adenomas, ssa's, hyperplastic - 09/21/2017 3 diminutive polyps removed adenomas - recall 2021 (2 yrs)      Hypercholesterolemia 11/25/2015   Hyperlipidemia    Hypertension    Left foot pain 01/27/2016   Nocturia more than twice per night 12/31/2015   Pain in joints 12/31/2015   Pectoralis major tendinitis, right 03/18/2017   Pericarditis    Peripheral neuropathy 04/24/2018   Pneumonia    Primary osteoarthritis of right foot 11/14/2015   Shortness of breath 09/29/2017   Status post right foot surgery 06/23/2016   Strain of right pectoralis muscle 09/07/2016   Thoracic aortic aneurysm ([place]) 06/08/2017   Traumatic brain injury ([place])    1976   Ulcer    Varicose veins    Varicose veins of lower extremities with ulcer ([place]) 11/16/2012    Past Surgical History:  Procedure Laterality Date   BURR HOLE OF CRANIUM  04/10/75   COLONOSCOPY     COLONOSCOPY W/ POLYPECTOMY  2004, 2007, 02/03/11   multiple large hyperplastic polyps 04/07, adenomas and one serrated 2012   ENDOVENOUS ABLATION SAPHENOUS VEIN W/ LASER Left 09-02-2010   EVLA left great and small saphenous vein by Curt Jews MD    FOOT FUSION Right    [facility]   neck fusion     POLYPECTOMY     ROTATOR  CUFF REPAIR Right 2019   rt. knee replacement     THORACIC AORTIC ENDOVASCULAR STENT GRAFT N/A 06/08/2017   Procedure: THORACIC AORTIC ENDOVASCULAR STENT GRAFT;  Surgeon: Rosetta Posner, MD;  Location: [facility] OR;  Service: Vascular;  Laterality: N/A;    Prior to Admission medications   Medication Sig Start Date End Date Taking? Authorizing Provider  amLODipine (NORVASC) 10 MG tablet daily. 03/23/19  Yes [provider]  amphetamine-dextroamphetamine (ADDERALL) 30 MG tablet Take 30 mg by mouth 2 (two) times daily. 04/21/17  Yes [provider]  DULoxetine (CYMBALTA) 30 MG capsule TAKE ONE CAPSULE BY MOUTH DAILY ORAL 12/29/20  Yes [provider]  furosemide (LASIX) 40 MG  tablet Take 40 mg by mouth daily as needed. 08/28/21  Yes [provider]  irbesartan-hydrochlorothiazide (AVALIDE) 300-12.5 MG tablet Take 1 tablet by mouth daily. 11/25/20  Yes [provider]  lovastatin (MEVACOR) 20 MG tablet Take 40 mg by mouth daily.    Yes [provider]  nebivolol (BYSTOLIC) 5 MG tablet Take 5 mg by mouth daily. 08/28/21  Yes [provider]  mupirocin ointment (BACTROBAN) 2 % Apply 1 application topically 2 (two) times daily. Patient not taking: Reported on 04/02/2022 11/10/20   Trula Slade, DPM  silver sulfADIAZINE (SILVADENE) 1 % cream Apply 1 application topically daily. Patient not taking: Reported on 04/02/2022 10/19/21   Trula Slade, DPM  traMADol (ULTRAM) 50 MG tablet Take 50 mg by mouth 3 (three) times daily as needed for moderate pain.    [provider]    Current Outpatient Medications  Medication Sig Dispense Refill   amLODipine (NORVASC) 10 MG tablet daily.     amphetamine-dextroamphetamine (ADDERALL) 30 MG tablet Take 30 mg by mouth 2 (two) times daily.     DULoxetine (CYMBALTA) 30 MG capsule TAKE ONE CAPSULE BY MOUTH DAILY ORAL     furosemide (LASIX) 40 MG tablet Take 40 mg by mouth daily as needed.     irbesartan-hydrochlorothiazide (AVALIDE) 300-12.5 MG tablet Take 1 tablet by mouth daily.     lovastatin (MEVACOR) 20 MG tablet Take 40 mg by mouth daily.      nebivolol (BYSTOLIC) 5 MG tablet Take 5 mg by mouth daily.     mupirocin ointment (BACTROBAN) 2 % Apply 1 application topically 2 (two) times daily. (Patient not taking: Reported on 04/02/2022) 30 g 2   silver sulfADIAZINE (SILVADENE) 1 % cream Apply 1 application topically daily. (Patient not taking: Reported on 04/02/2022) 50 g 0   traMADol (ULTRAM) 50 MG tablet Take 50 mg by mouth 3 (three) times daily as needed for moderate pain.     Current Facility-Administered Medications  Medication Dose Route Frequency Provider Last Rate Last Admin    0.9 %  sodium chloride infusion  500 mL Intravenous Once Gatha Mayer, MD       0.9 %  sodium chloride infusion  500 mL Intravenous Once Gatha Mayer, MD        Allergies as of 04/02/2022 - Review Complete 04/02/2022  Allergen Reaction Noted   Terazosin Other (See Comments) 09/02/2016    Family History  Problem Relation Age of Onset   Rectal cancer Mother    Colon cancer Mother        dx'd late 4's - died 04/27/2017   Colon polyps Father    Bone cancer Sister    Breast cancer Sister    Kidney cancer Sister    Esophageal cancer Neg  Hx    Stomach cancer Neg Hx    Crohn's disease Neg Hx    Pancreatic cancer Neg Hx     Social History   Socioeconomic History   Marital status: Married    Spouse name: Not on file   Number of children: Not on file   Years of education: Not on file   Highest education level: Not on file  Occupational History   Not on file  Tobacco Use   Smoking status: Former    Types: Cigarettes    Quit date: 01/20/1981    Years since quitting: 41.2    Passive exposure: Past (PARENTS SMOKED)   Smokeless tobacco: Never  Vaping Use   Vaping Use: Never used  Substance and Sexual Activity   Alcohol use: Yes    Comment: SOCIALLY BEER   Drug use: No   Sexual activity: Not on file  Other Topics Concern   Not on file  Social History Narrative   Not on file   Social Determinants of Health   Financial Resource Strain: Not on file  Food Insecurity: Not on file  Transportation Needs: Not on file  Physical Activity: Not on file  Stress: Not on file  Social Connections: Not on file  Intimate Partner Violence: Not on file    Review of Systems: Positive for right ankle ulcer - Unna boot All other review of systems negative except as mentioned in the HPI.  Physical Exam: Vital signs BP (!) 153/86   Pulse (!) 57   Temp 98.6 F (37 C)   Ht '6\' 3"'$  (1.905 m)   Wt 247 lb 6.4 oz (112.2 kg)   SpO2 97%   BMI 30.92 kg/m   General:   Alert,   Well-developed, well-nourished, pleasant and cooperative in NAD Lungs:  Clear throughout to auscultation.   Heart:  Regular rate and rhythm; no murmurs, clicks, rubs,  or gallops. Abdomen:  Soft, nontender and nondistended. Normal bowel sounds.   Neuro/Psych:  Alert and cooperative. Normal mood and affect. A and O x 3   '@Nerissa Constantin'$  Simonne Maffucci, MD, [facility] [phone] (pager) 04/02/2022 10:07 AM@

## 2022-04-02 NOTE — Op Note (Addendum)
[place] Patient Name: Trevor [name] Procedure Date: 04/02/2022 10:06 AM MRN: [id number] Endoscopist: Gatha Mayer , MD Age: 71 Referring MD:  Date of Birth: 1951/03/07 Gender: Male Account #: [id number] Procedure:                Colonoscopy Indications:              High risk colon cancer surveillance: Personal                            history of colonic polyps Medicines:                Monitored Anesthesia Care Procedure:                Pre-Anesthesia Assessment:                           - Prior to the procedure, a History and Physical                            was performed, and patient medications and                            allergies were reviewed. The patient's tolerance of                            previous anesthesia was also reviewed. The risks                            and benefits of the procedure and the sedation                            options and risks were discussed with the patient.                            All questions were answered, and informed consent                            was obtained. Prior Anticoagulants: The patient has                            taken no previous anticoagulant or antiplatelet                            agents. ASA Grade Assessment: III - A patient with                            severe systemic disease. After reviewing the risks                            and benefits, the patient was deemed in                            satisfactory condition to undergo the procedure.  After obtaining informed consent, the colonoscope                            was passed under direct vision. Throughout the                            procedure, the patient's blood pressure, pulse, and                            oxygen saturations were monitored continuously. The                            CF [id number] #[id number] was introduced through the anus                            and advanced to the the cecum,  identified by                            appendiceal orifice and ileocecal valve. The                            colonoscopy was performed without difficulty. The                            patient tolerated the procedure well. The quality                            of the bowel preparation was good. The ileocecal                            valve, appendiceal orifice, and rectum were                            photographed. The bowel preparation used was                            Miralax via split dose instruction. Scope In: 10:19:35 AM Scope Out: 10:43:18 AM Scope Withdrawal Time: 0 hours 19 minutes 23 seconds  Total Procedure Duration: 0 hours 23 minutes 43 seconds  Findings:                 The perianal and digital rectal examinations were                            normal.                           Twelve (12) sessile polyps were found in the                            descending colon, transverse colon and cecum. The                            polyps were diminutive in size. These polyps were  removed with a cold snare. Resection and retrieval                            were complete. Verification of patient                            identification for the specimen was done. Estimated                            blood loss was minimal.                           Multiple diverticula were found in the sigmoid                            colon.                           The exam was otherwise without abnormality on                            direct and retroflexion views. Complications:            No immediate complications. Estimated Blood Loss:     Estimated blood loss was minimal. Impression:               - Twelve (12) diminutive polyps in the descending                            colon, in the transverse colon and in the cecum,                            removed with a cold snare. Resected and retrieved.                           - Diverticulosis in the  sigmoid colon.                           - The examination was otherwise normal on direct                            and retroflexion views.                           - Personal history of colonic polyps.                           -3 10 mm hyperplastic polyps removed from right and                            left colon 2004                           4 polyps, one 10 mm others diminutive and  hyperplastic 2007                           Followed closer than routine risk due to multiple                            large hyperplastic polyps                           5 adenomas with one of them serrated                           05/29/2015 11 polyps max 10 mm adenomas, ssa's,                            hyperplastic -                           09/21/2017 3 diminutive polyps removed adenomas                           12/26/2019 11 diminutive polyps 6 adenomas 1                            hyperplastic and rest normal mucosa Recommendation:           - Patient has a contact number available for                            emergencies. The signs and symptoms of potential                            delayed complications were discussed with the                            patient. Return to normal activities tomorrow.                            Written discharge instructions were provided to the                            patient.                           - Resume previous diet.                           - Continue present medications.                           - Await pathology results.                           - Repeat colonoscopy is recommended for                            surveillance. The colonoscopy date will be  determined after pathology results from today's                            exam become available for review.                           - consider genetics evaluation YES - WILL REFER Gatha Mayer, MD 04/02/2022 10:53:49 AM This  report has been signed electronically.

## 2022-04-02 NOTE — Patient Instructions (Addendum)
Twelve (12) polyps today.  I will let you know pathology results and when to have another routine colonoscopy by mail and/or My Chart.  I appreciate the opportunity to care for you. Gatha Mayer, MD, [facility]   Read all of the handouts given to you by your recovery room nurse.  You may want to have genetic testing per Dr. Carlean Purl.  YOU HAD AN ENDOSCOPIC PROCEDURE TODAY AT [facility]:   Refer to the procedure report that was given to you for any specific questions about what was found during the examination.  If the procedure report does not answer your questions, please call your gastroenterologist to clarify.  If you requested that your care partner not be given the details of your procedure findings, then the procedure report has been included in a sealed envelope for you to review at your convenience later.  YOU SHOULD EXPECT: Some feelings of bloating in the abdomen. Passage of more gas than usual.  Walking can help get rid of the air that was put into your GI tract during the procedure and reduce the bloating. If you had a lower endoscopy (such as a colonoscopy or flexible sigmoidoscopy) you may notice spotting of blood in your stool or on the toilet paper. If you underwent a bowel prep for your procedure, you may not have a normal bowel movement for a few days.  Please Note:  You might notice some irritation and congestion in your nose or some drainage.  This is from the oxygen used during your procedure.  There is no need for concern and it should clear up in a day or so.  SYMPTOMS TO REPORT IMMEDIATELY:  Following lower endoscopy (colonoscopy or flexible sigmoidoscopy):  Excessive amounts of blood in the stool  Significant tenderness or worsening of abdominal pains  Swelling of the abdomen that is new, acute  Fever of 100F or higher   For urgent or emergent issues, a gastroenterologist can be reached at any hour by calling [phone]. Do not use MyChart  messaging for urgent concerns.    DIET:  We do recommend a small meal at first, but then you may proceed to your regular diet.  Drink plenty of fluids but you should avoid alcoholic beverages for 24 hours.  ACTIVITY:  You should plan to take it easy for the rest of today and you should NOT DRIVE or use heavy machinery until tomorrow (because of the sedation medicines used during the test).    FOLLOW UP: Our staff will call the number listed on your records the next business day following your procedure.  We will call around 7:15- 8:00 am to check on you and address any questions or concerns that you may have regarding the information given to you following your procedure. If we do not reach you, we will leave a message.  If you develop any symptoms (ie: fever, flu-like symptoms, shortness of breath, cough etc.) before then, please call [phone].  If you test positive for Covid 19 in the 2 weeks post procedure, please call and report this information to [place].    If any biopsies were taken you will be contacted by phone or by letter within the next 1-3 weeks.  Please call us at [phone] if you have not heard about the biopsies in 3 weeks.    SIGNATURES/CONFIDENTIALITY: You and/or your care partner have signed paperwork which will be entered into your electronic medical record.  These signatures attest to the fact  that that the information above on your After Visit Summary has been reviewed and is understood.  Full responsibility of the confidentiality of this discharge information lies with you and/or your care-partner.

## 2022-04-02 NOTE — Progress Notes (Signed)
Pt's states no medical or surgical changes since previsit or office visit. 

## 2022-04-02 NOTE — Progress Notes (Signed)
VSS, transported to PACU °

## 2022-04-05 ENCOUNTER — Ambulatory Visit: Payer: Medicare Other | Attending: Internal Medicine | Admitting: Physical Therapy

## 2022-04-05 ENCOUNTER — Encounter: Payer: Self-pay | Admitting: Physical Therapy

## 2022-04-05 ENCOUNTER — Telehealth: Payer: Self-pay | Admitting: *Deleted

## 2022-04-05 DIAGNOSIS — R2689 Other abnormalities of gait and mobility: Secondary | ICD-10-CM | POA: Diagnosis not present

## 2022-04-05 DIAGNOSIS — R2681 Unsteadiness on feet: Secondary | ICD-10-CM | POA: Insufficient documentation

## 2022-04-05 DIAGNOSIS — M6281 Muscle weakness (generalized): Secondary | ICD-10-CM | POA: Diagnosis not present

## 2022-04-05 NOTE — Therapy (Signed)
OUTPATIENT PHYSICAL THERAPY NEURO EVALUATION   Patient Name: Trevor [name] MRN: [id number] DOB:26-Jul-1951, 71 y.o., male Today's Date: 04/06/2022   PCP: Velna Hatchet, MD  REFERRING PROVIDER: Velna Hatchet, MD    PT End of Session - 04/06/22 1603     Visit Number 1    Number of Visits 7    Date for PT Re-Evaluation 05/21/22    Authorization Type Medicare    Authorization Time Period 04-05-22 - 06-12-22    PT Start Time 1018    PT Stop Time 1100    PT Time Calculation (min) 42 min    Activity Tolerance Patient tolerated treatment well    Behavior During Therapy [facility] for tasks assessed/performed             Past Medical History:  Diagnosis Date   Acute pain of left shoulder 04/04/2017   ADD (attention deficit disorder)    Allergy    Anxiety    Arthritis    right foot   Arthritis of midfoot 04/29/2016   Overview:  Added automatically from request for surgery [id number]   Blood transfusion without reported diagnosis    Cataract    removed both eyes with lens implants    Chest wall pain 09/29/2017   Chronic pain of right ankle 04/08/2016   Cough due to ACE inhibitor 03/18/2017   Depression    Descending thoracic aortic aneurysm ([place]) 04/04/2017   Overview:  4.5 cm on CT scan of April 04, 2017.   Diverticulosis of colon 11/25/2015   Dyspnea on exertion 04/04/2017   Essential hypertension 11/25/2015   Executive function deficit 12/31/2015   Family history of colon cancer in mother    dx'd age late 88's   Foot pain, right 04/08/2016   Gait abnormality 03/29/2019   H/O blood clots    Health maintenance examination 09/29/2017   History of colonic polyps 02/04/2011   3 10 mm hyperplastic polyps removed from right and left colon 2004 4 polyps, one 10 mm others diminutive and hyperplastic 2007 Followed closer than routine risk due to multiple large hyperplastic polyps 5 adenomas with one of them serrated  05/29/2015 11 polyps max 10 mm adenomas, ssa's, hyperplastic - 09/21/2017 3 diminutive  polyps removed adenomas - recall 2021 (2 yrs)      Hypercholesterolemia 11/25/2015   Hyperlipidemia    Hypertension    Left foot pain 01/27/2016   Nocturia more than twice per night 12/31/2015   Pain in joints 12/31/2015   Pectoralis major tendinitis, right 03/18/2017   Pericarditis    Peripheral neuropathy 04/24/2018   Pneumonia    Primary osteoarthritis of right foot 11/14/2015   Shortness of breath 09/29/2017   Status post right foot surgery 06/23/2016   Strain of right pectoralis muscle 09/07/2016   Thoracic aortic aneurysm ([place]) 06/08/2017   Traumatic brain injury ([place])    1976   Ulcer    Varicose veins    Varicose veins of lower extremities with ulcer ([place]) 11/16/2012   Past Surgical History:  Procedure Laterality Date   BURR HOLE OF CRANIUM  04/10/75   COLONOSCOPY     COLONOSCOPY W/ POLYPECTOMY  2004, 2007, 02/03/11   multiple large hyperplastic polyps 04/07, adenomas and one serrated 2012   ENDOVENOUS ABLATION SAPHENOUS VEIN W/ LASER Left 09-02-2010   EVLA left great and small saphenous vein by Curt Jews MD    FOOT FUSION Right    [facility]- Robert Tisdall   neck fusion     POLYPECTOMY  ROTATOR CUFF REPAIR Right 2019   rt. knee replacement     THORACIC AORTIC ENDOVASCULAR STENT GRAFT N/A 06/08/2017   Procedure: THORACIC AORTIC ENDOVASCULAR STENT GRAFT;  Surgeon: Rosetta Posner, MD;  Location: [facility];  Service: Vascular;  Laterality: N/A;   Patient Active Problem List   Diagnosis Date Noted   Gait abnormality 03/29/2019   Peripheral neuropathy 04/24/2018   S/P rotator cuff repair 01/31/2018   Nontraumatic tear of right supraspinatus tendon 01/19/2018   Brachial neuritis or radiculitis 11/24/2017   Chest wall pain 09/29/2017   Shortness of breath 09/29/2017   Health maintenance examination 09/29/2017   Thoracic aortic aneurysm ([place]) 06/08/2017   Acute pain of left shoulder 04/04/2017   Descending thoracic aortic aneurysm ([place]) 04/04/2017   Dyspnea on exertion 04/04/2017    Cough due to ACE inhibitor 03/18/2017   Pectoralis major tendinitis, right 03/18/2017   Strain of right pectoralis muscle 09/07/2016   Status post right foot surgery 06/23/2016   Arthritis of midfoot 04/29/2016   Chronic pain of right ankle 04/08/2016   Foot pain, right 04/08/2016   Left foot pain 01/27/2016   Executive function deficit 12/31/2015   Nocturia more than twice per night 12/31/2015   Pain in joints 12/31/2015   ADD (attention deficit disorder) 11/25/2015   Depression 11/25/2015   Diverticulosis of colon 11/25/2015   Essential hypertension 11/25/2015   Hypercholesterolemia 11/25/2015   Primary osteoarthritis of right foot 11/14/2015   Family history of rectal cancer in elderly mother 05/29/2015   Varicose veins of lower extremities with ulcer ([place]) 11/16/2012   History of colonic polyps 02/04/2011    ONSET DATE: 2017 for Rt foot surgery:  Referral date 03-23-22  REFERRING DIAG: Balance disorder:  Charcot's joint Rt foot  THERAPY DIAG:  Other abnormalities of gait and mobility - Plan: PT plan of care cert/re-cert  Muscle weakness (generalized) - Plan: PT plan of care cert/re-cert  Unsteadiness on feet - Plan: PT plan of care cert/re-cert  Rationale for Evaluation and Treatment Rehabilitation  SUBJECTIVE:                                                                                                                                                                                              SUBJECTIVE STATEMENT: Pt states his legs are stronger since discharge last August (2022):  pt states he has had 2 falls in past year (none in past 6 months);  pt is currently getting home health nursing; states nurse has not come in a couple of weeks but is scheduled to come Wed. This week.  Pt states he is supposed  to wear TED hose but doesn't wear them everyday because they are hard to put on.  Pt has ulcer on Rt medial lower leg due to chronic venous insufficiency, MD chart notes  state ulcer is healing.  Pt continues to wear air cast on LLE and wearing shoe with lift on RLE    Pt accompanied by: self  PERTINENT HISTORY: h/o TBI 1976, Charcot's joint of Rt foot - s/p Rt foot surgery 06-23-16 (foot fusion), chronic venous insufficiency with current ulcer on RLE and Rt medial malleolus, HTN, peripheral neuropathy, h/o thoracic aortic aneurysm, h/o neck fusion, s/p thoracic aortic endovascular stent graft, primary OA Rt foot  PAIN:  Are you having pain? No  PRECAUTIONS: None  WEIGHT BEARING RESTRICTIONS  None  FALLS: Has patient fallen in last 6 months? No and pt reports 2 falls in past year   LIVING ENVIRONMENT: Lives with: lives with their spouse Lives in: House/apartment Stairs: Yes: Internal: 12 steps; on right going up and External: 2 steps; on right going up Has following equipment at home: Single point cane  PLOF: Independent with basic ADLs, Independent with household mobility without device, Independent with community mobility with device, and Independent with transfers  PATIENT GOALS: improve balance and leg strength  OBJECTIVE:   DIAGNOSTIC FINDINGS: N/A  COGNITION: Overall cognitive status: Impaired - decreased memory/ADD   SENSATION: Impaired light touch RLE  COORDINATION: WFL's  EDEMA:  Pt has edema in bil. Lower legs - states he is supposed to wear compression stockings but doesn't always wear them because they are difficult to don   POSTURE: rounded shoulders, forward head, and flexed trunk   LOWER EXTREMITY ROM:   WFL's bil. LE's except Rt dorsiflexion due to s/p ankle fusion; approx. -10 degrees active dorsiflexion Rt foot   LOWER EXTREMITY MMT:    MMT Right Eval Left Eval  Hip flexion 4 4+  Hip extension    Hip abduction    Hip adduction    Hip internal rotation    Hip external rotation    Knee flexion 4 5  Knee extension 5 5  Ankle dorsiflexion 2+ 4  Ankle plantarflexion    Ankle inversion 0   Ankle eversion 0    (Blank rows = not tested)  BED MOBILITY:  Independent  TRANSFERS: Assistive device utilized: None  Sit to stand: Modified independence Stand to sit: Modified independence pt requires bil. UE support to stand from standard chair  RAMP:  Level of Assistance: Modified independence Assistive device utilized: None  CURB:  Level of Assistance: SBA Assistive device utilized: Single point cane  STAIRS:  Level of Assistance: Modified independence  Stair Negotiation Technique: Forwards with Bilateral Rails  Number of Stairs: 4   Height of Stairs: 6  Comments: leads with LLE  GAIT: Gait pattern: step through pattern Distance walked: 100 Assistive device utilized: None Level of assistance: Modified independence Comments: pt wearing air cast on LLE and shoe with lift on RLE Gait velocity = 12.09 secs = 2.71 ft/sec   FUNCTIONAL TESTs:  5 times sit to stand: 20.81 secs with UE support from standard chair Timed up and go (TUG): 15.28 secs  LLE SLS = 3.22 secs RLE SLS = unable to stand on RLE   PATIENT EDUCATION: Education details: eval results with comparison of scores from Aug. 2022 - time of D/C from previous admission Person educated: Patient Education method: Explanation Education comprehension: verbalized understanding   HOME EXERCISE PROGRAM: To be established    GOALS:  Goals reviewed with patient? Yes  TARGET DATE 04-30-22   PT Short Term Goals - 04/06/22 1951       PT SHORT TERM GOAL #1   Title Independent in HEP for bil. LE strengthening and balance exercises.    Time 3    Period Weeks    Status New    Target Date 04/30/22      PT SHORT TERM GOAL #2   Title Improve TUG score from 15.28 secs with no device to </= 13.0 secs for reduced fall risk.    Time 3    Period Weeks    Status New    Target Date 04/30/22      PT SHORT TERM GOAL #3   Title Pt will improve 5x sit to stand score to </= 17 secs with bil. UE support from standard chair to demo  improved LE strength.    Baseline 20.81 secs    Time 3    Period Weeks    Status New    Target Date 04/30/22               LONG TERM GOALS: Target date: 05/21/2022    PT Long Term Goals - 04/06/22 1955       PT LONG TERM GOAL #1   Title Increase TUG score from 15.28 secs to </= 12.0 secs without device for reduced fall risk.    Baseline 15.28 secs    Time 6    Period Weeks    Status New    Target Date 05/21/22      PT LONG TERM GOAL #2   Title Increase gait velocity from 2.71 ft/sec to >/= 3.0 ft/sec without device for increased gait effiiciency.    Baseline 12.09 = 2.71 ft/sec without device    Time 6    Period Weeks    Status New    Target Date 05/21/22      PT LONG TERM GOAL #3   Title Improve balance so pt able to perform SLS on each leg for at least 4 secs on LLE and at least 2 secs on RLE to increase safety with stepping over objects.    Baseline 3.22 secs on LLE:  unable to perform SLS on RLE    Time 6    Period Weeks    Status New    Target Date 05/21/22      PT LONG TERM GOAL #4   Title Improve LE strength so pt able to perform sit to stand from standard chair with 1 UE support.    Baseline bil. UE support needed    Time 6    Period Weeks    Status New    Target Date 05/21/22             ASSESSMENT:  CLINICAL IMPRESSION: Patient is a 71 y.o. gentleman who was seen today for physical therapy evaluation and treatment for gait and balance disorder due to pt having Charcot joint Rt foot with s/p ankle fusion. Pt presents with bil. Hip extensor weakness with inability to stand from standard chair without bil. UE support.  Pt is amb. Without use of device but reports he has a [facility] which he uses when ambulating in community with prolonged distances required.  Pt is at slight fall risk per TUG score of 15.28 secs;  gait velocity is decreased at 2.71 ft/sec without use of device.  Pt demonstrates decreased high level balance skills with LLE SLS 3.22 secs and  inability  to perform SLS on RLE.  Pt has been seen at this facility several times in past several years to address Rt ankle ROM (Rt ankle is fused) and to also address balance deficits; pt was last seen in June 2022 - August 2022 for 5 visits.    OBJECTIVE IMPAIRMENTS decreased activity tolerance, decreased balance, difficulty walking, decreased ROM, decreased strength, and impaired flexibility.   ACTIVITY LIMITATIONS carrying, bending, squatting, stairs, transfers, and locomotion level  PARTICIPATION LIMITATIONS: meal prep, cleaning, laundry, shopping, community activity, and yard work  PERSONAL FACTORS Behavior pattern, Time since onset of injury/illness/exacerbation, and 1 comorbidity: Rt Charcot foot with limited ROM due to ankle fusion  are also affecting patient's functional outcome.   REHAB POTENTIAL: Fair due to chronicity of musculoskeletal deficits which are impacting balance  CLINICAL DECISION MAKING: Stable/uncomplicated  EVALUATION COMPLEXITY: Low  PLAN: PT FREQUENCY: 1x/week  PT DURATION: 6 weeks  PLANNED INTERVENTIONS: Therapeutic exercises, Therapeutic activity, Neuromuscular re-education, Balance training, Gait training, Patient/Family education, Self Care, Stair training, and Orthotic/Fit training  PLAN FOR NEXT SESSION:  issue  balance HEP; strengthening exercises   Kaelee Pfeffer, [place], PT 04/06/2022, 8:19 PM

## 2022-04-05 NOTE — Telephone Encounter (Signed)
No answer on  follow up call. Left message.   

## 2022-04-06 ENCOUNTER — Ambulatory Visit: Payer: Medicare Other | Admitting: Vascular Surgery

## 2022-04-06 ENCOUNTER — Encounter: Payer: Self-pay | Admitting: Physical Therapy

## 2022-04-06 DIAGNOSIS — G629 Polyneuropathy, unspecified: Secondary | ICD-10-CM | POA: Diagnosis not present

## 2022-04-06 DIAGNOSIS — M14671 Charcot's joint, right ankle and foot: Secondary | ICD-10-CM | POA: Diagnosis not present

## 2022-04-06 DIAGNOSIS — M2041 Other hammer toe(s) (acquired), right foot: Secondary | ICD-10-CM | POA: Diagnosis not present

## 2022-04-14 ENCOUNTER — Encounter: Payer: Self-pay | Admitting: Internal Medicine

## 2022-04-14 ENCOUNTER — Other Ambulatory Visit: Payer: Self-pay

## 2022-04-14 DIAGNOSIS — D123 Benign neoplasm of transverse colon: Secondary | ICD-10-CM

## 2022-04-14 DIAGNOSIS — D124 Benign neoplasm of descending colon: Secondary | ICD-10-CM

## 2022-04-14 DIAGNOSIS — Z86010 Personal history of colonic polyps: Secondary | ICD-10-CM

## 2022-04-15 ENCOUNTER — Telehealth: Payer: Self-pay | Admitting: Genetic Counselor

## 2022-04-15 ENCOUNTER — Ambulatory Visit: Payer: Medicare Other | Attending: Internal Medicine | Admitting: Physical Therapy

## 2022-04-15 DIAGNOSIS — M6281 Muscle weakness (generalized): Secondary | ICD-10-CM | POA: Diagnosis not present

## 2022-04-15 DIAGNOSIS — R2681 Unsteadiness on feet: Secondary | ICD-10-CM | POA: Insufficient documentation

## 2022-04-15 NOTE — Telephone Encounter (Signed)
Scheduled appt per 8/3 referral. Pt's wife is aware of appt date and time. Pt's wife is aware to arrive 15 mins prior to appt time and to bring and updated insurance card. Pt's wife is aware of appt location.

## 2022-04-15 NOTE — Therapy (Signed)
OUTPATIENT PHYSICAL THERAPY NEURO TREATMENT NOTE   Patient Name: Trevor [name] MRN: [id number] DOB:Jul 08, 1951, 71 y.o., male Today's Date: 04/16/2022   PCP: Velna Hatchet, MD  REFERRING PROVIDER: Velna Hatchet, MD    PT End of Session - 04/16/22 1447     Visit Number 2    Number of Visits 7    Date for PT Re-Evaluation 05/21/22    Authorization Type Medicare    Authorization Time Period 04-05-22 - 06-12-22    PT Start Time 1105    PT Stop Time 1150    PT Time Calculation (min) 45 min    Activity Tolerance Patient tolerated treatment well    Behavior During Therapy [facility] for tasks assessed/performed              Past Medical History:  Diagnosis Date   Acute pain of left shoulder 04/04/2017   ADD (attention deficit disorder)    Allergy    Anxiety    Arthritis    right foot   Arthritis of midfoot 04/29/2016   Overview:  Added automatically from request for surgery [id number]   Blood transfusion without reported diagnosis    Cataract    removed both eyes with lens implants    Chest wall pain 09/29/2017   Chronic pain of right ankle 04/08/2016   Cough due to ACE inhibitor 03/18/2017   Depression    Descending thoracic aortic aneurysm ([place]) 04/04/2017   Overview:  4.5 cm on CT scan of April 04, 2017.   Diverticulosis of colon 11/25/2015   Dyspnea on exertion 04/04/2017   Essential hypertension 11/25/2015   Executive function deficit 12/31/2015   Family history of colon cancer in mother    dx'd age late 72's   Foot pain, right 04/08/2016   Gait abnormality 03/29/2019   H/O blood clots    Health maintenance examination 09/29/2017   History of colonic polyps 02/04/2011   3 10 mm hyperplastic polyps removed from right and left colon 2004 4 polyps, one 10 mm others diminutive and hyperplastic 2007 Followed closer than routine risk due to multiple large hyperplastic polyps 5 adenomas with one of them serrated  05/29/2015 11 polyps max 10 mm adenomas, ssa's, hyperplastic - 09/21/2017 3  diminutive polyps removed adenomas - recall 2021 (2 yrs)      Hypercholesterolemia 11/25/2015   Hyperlipidemia    Hypertension    Left foot pain 01/27/2016   Nocturia more than twice per night 12/31/2015   Pain in joints 12/31/2015   Pectoralis major tendinitis, right 03/18/2017   Pericarditis    Peripheral neuropathy 04/24/2018   Pneumonia    Primary osteoarthritis of right foot 11/14/2015   Shortness of breath 09/29/2017   Status post right foot surgery 06/23/2016   Strain of right pectoralis muscle 09/07/2016   Thoracic aortic aneurysm ([place]) 06/08/2017   Traumatic brain injury ([place])    1976   Ulcer    Varicose veins    Varicose veins of lower extremities with ulcer ([place]) 11/16/2012   Past Surgical History:  Procedure Laterality Date   BURR HOLE OF CRANIUM  04/10/75   COLONOSCOPY     COLONOSCOPY W/ POLYPECTOMY  2004, 2007, 02/03/11   multiple large hyperplastic polyps 04/07, adenomas and one serrated 2012   ENDOVENOUS ABLATION SAPHENOUS VEIN W/ LASER Left 09-02-2010   EVLA left great and small saphenous vein by Curt Jews MD    FOOT FUSION Right    [facility]- [facility]   neck fusion  POLYPECTOMY     ROTATOR CUFF REPAIR Right 2019   rt. knee replacement     THORACIC AORTIC ENDOVASCULAR STENT GRAFT N/A 06/08/2017   Procedure: THORACIC AORTIC ENDOVASCULAR STENT GRAFT;  Surgeon: Rosetta Posner, MD;  Location: [place];  Service: Vascular;  Laterality: N/A;   Patient Active Problem List   Diagnosis Date Noted   Gait abnormality 03/29/2019   Peripheral neuropathy 04/24/2018   S/P rotator cuff repair 01/31/2018   Nontraumatic tear of right supraspinatus tendon 01/19/2018   Brachial neuritis or radiculitis 11/24/2017   Chest wall pain 09/29/2017   Shortness of breath 09/29/2017   Health maintenance examination 09/29/2017   Thoracic aortic aneurysm ([place]) 06/08/2017   Acute pain of left shoulder 04/04/2017   Descending thoracic aortic aneurysm ([place]) 04/04/2017   Dyspnea on exertion  04/04/2017   Cough due to ACE inhibitor 03/18/2017   Pectoralis major tendinitis, right 03/18/2017   Strain of right pectoralis muscle 09/07/2016   Status post right foot surgery 06/23/2016   Arthritis of midfoot 04/29/2016   Chronic pain of right ankle 04/08/2016   Foot pain, right 04/08/2016   Left foot pain 01/27/2016   Executive function deficit 12/31/2015   Nocturia more than twice per night 12/31/2015   Pain in joints 12/31/2015   ADD (attention deficit disorder) 11/25/2015   Depression 11/25/2015   Diverticulosis of colon 11/25/2015   Essential hypertension 11/25/2015   Hypercholesterolemia 11/25/2015   Primary osteoarthritis of right foot 11/14/2015   Family history of rectal cancer in elderly mother 05/29/2015   Varicose veins of lower extremities with ulcer ([place]) 11/16/2012   History of colonic polyps 02/04/2011    ONSET DATE: 2017 for Rt foot surgery:  Referral date 03-23-22  REFERRING DIAG: Balance disorder:  Charcot's joint Rt foot  THERAPY DIAG:  Unsteadiness on feet  Muscle weakness (generalized)  Rationale for Evaluation and Treatment Rehabilitation  SUBJECTIVE:                                                                                                                                                                                              SUBJECTIVE STATEMENT: Pt states no changes since eval; has been to his community pool some and did exercises on his own  Pt accompanied by: self  PERTINENT HISTORY: h/o TBI 1976, Charcot's joint of Rt foot - s/p Rt foot surgery 06-23-16 (foot fusion), chronic venous insufficiency with current ulcer on RLE and Rt medial malleolus, HTN, peripheral neuropathy, h/o thoracic aortic aneurysm, h/o neck fusion, s/p thoracic aortic endovascular stent graft, primary OA Rt foot  PAIN:  Are you having pain?  No  PATIENT GOALS: improve balance and leg strength  OBJECTIVE:    NeuroRe-ed:   TREATMENT 04-15-22:  Instructed  in balance HEP - performed exs. Inside // bars to have UE support prn, but pt used bars minimally  PATIENT EDUCATION: Education details: Balance exercises issued -  Standing Marching   Using a chair if necessary, march in place. Repeat 10 times. Do 1 sessions per day.  Hip Backward Kick   Using a chair for balance, keep legs shoulder width apart and toes pointed for- ward. Slowly extend one leg back, keeping knee straight. Do not lean forward. Repeat with other leg. Repeat 10 times. Do 1 sessions per day.    Hip Side Kick   Holding a chair for balance, keep legs shoulder width apart and toes pointed forward. Swing a leg out to side, keeping knee straight. Do not lean. Repeat using other leg. Repeat 10 times. Do 1 sessions per day.   Feet Heel-Toe "Tandem", Varied Arm Positions - Eyes Open   With eyes open, right foot directly in front of the other, arms out, look straight ahead at a stationary object. Hold 30 seconds. Repeat 2  times per session. Do 1 sessions per day.     Standing On One Leg Without Support .  Stand on one leg in neutral spine without support. Hold 10 seconds. Repeat on other leg. Do 2 repetitions, 1 sets.  Person educated: PatientEducation method: Explanation, demonstration and handout Education comprehension: verbalized understanding and demonstrated understanding with cues    HOME EXERCISE PROGRAM: See above - Balance HEP issued  Neuro Re-ed:  Balance exercises performed inside // bars - Rockerboard anterior/posteriorly 3 sets 10 reps with UE support on // bars prn  Rocking forward stepping down to floor 5 reps each leg; then rocking backward, stepping backward to floor 5 reps each leg  Cone taps to 2 cones - 5 reps each leg straight ahead; then 3 reps diagonally with each foot with UE support prn for recovery of LOB   TherEx: Bil. Hamstring stretch with foot on 2nd step (runner's stretch) - 30 sec hold x 1 reps each leg  Gastroc stretch  - bil. Heels hanging off edge of step - 30 sec hold x 1 rep (both feet together)               Leg Press - bil. LE's 70# 3 sets 10 reps  GOALS: Goals reviewed with patient? Yes  TARGET DATE 04-30-22   PT Short Term Goals - 04/06/22 1951       PT SHORT TERM GOAL #1   Title Independent in HEP for bil. LE strengthening and balance exercises.    Time 3    Period Weeks    Status New    Target Date 04/30/22      PT SHORT TERM GOAL #2   Title Improve TUG score from 15.28 secs with no device to </= 13.0 secs for reduced fall risk.    Time 3    Period Weeks    Status New    Target Date 04/30/22      PT SHORT TERM GOAL #3   Title Pt will improve 5x sit to stand score to </= 17 secs with bil. UE support from standard chair to demo improved LE strength.    Baseline 20.81 secs    Time 3    Period Weeks    Status New    Target Date 04/30/22  LONG TERM GOALS: Target date: 05/21/2022    PT Long Term Goals - 04/06/22 1955       PT LONG TERM GOAL #1   Title Increase TUG score from 15.28 secs to </= 12.0 secs without device for reduced fall risk.    Baseline 15.28 secs    Time 6    Period Weeks    Status New    Target Date 05/21/22      PT LONG TERM GOAL #2   Title Increase gait velocity from 2.71 ft/sec to >/= 3.0 ft/sec without device for increased gait effiiciency.    Baseline 12.09 = 2.71 ft/sec without device    Time 6    Period Weeks    Status New    Target Date 05/21/22      PT LONG TERM GOAL #3   Title Improve balance so pt able to perform SLS on each leg for at least 4 secs on LLE and at least 2 secs on RLE to increase safety with stepping over objects.    Baseline 3.22 secs on LLE:  unable to perform SLS on RLE    Time 6    Period Weeks    Status New    Target Date 05/21/22      PT LONG TERM GOAL #4   Title Improve LE strength so pt able to perform sit to stand from standard chair with 1 UE support.    Baseline bil. UE support needed    Time 6     Period Weeks    Status New    Target Date 05/21/22             ASSESSMENT:  CLINICAL IMPRESSION: PT session focused on establishing HEP for balance exercises - pt instructed to perform exercises near counter for safety for UE support prn.  Pt has decreased SLS on each leg but able to perform SLS approx. 2 secs on RLE and 3 secs on LLE.  Pt amb. Without use of cane in today's session.  Pt's orthopedic issues, including fusion of Rt ankle due to Charcot joint, impacts balance.  Cont with POC.    OBJECTIVE IMPAIRMENTS decreased activity tolerance, decreased balance, difficulty walking, decreased ROM, decreased strength, and impaired flexibility.   ACTIVITY LIMITATIONS carrying, bending, squatting, stairs, transfers, and locomotion level  PARTICIPATION LIMITATIONS: meal prep, cleaning, laundry, shopping, community activity, and yard work  PERSONAL FACTORS Behavior pattern, Time since onset of injury/illness/exacerbation, and 1 comorbidity: Rt Charcot foot with limited ROM due to ankle fusion  are also affecting patient's functional outcome.   REHAB POTENTIAL: Fair due to chronicity of musculoskeletal deficits which are impacting balance  CLINICAL DECISION MAKING: Stable/uncomplicated  EVALUATION COMPLEXITY: Low  PLAN: PT FREQUENCY: 1x/week  PT DURATION: 6 weeks  PLANNED INTERVENTIONS: Therapeutic exercises, Therapeutic activity, Neuromuscular re-education, Balance training, Gait training, Patient/Family education, Self Care, Stair training, and Orthotic/Fit training  PLAN FOR NEXT SESSION:  check balance HEP; strengthening exercises   Belynda Pagaduan, [place], PT 04/16/2022, 2:48 PM

## 2022-04-15 NOTE — Patient Instructions (Signed)
Standing Marching   Using a chair if necessary, march in place. Repeat 10 times. Do 1 sessions per day.   Hip Backward Kick   Using a chair for balance, keep legs shoulder width apart and toes pointed for- ward. Slowly extend one leg back, keeping knee straight. Do not lean forward. Repeat with other leg. Repeat 10 times. Do 1 sessions per day.     Hip Side Kick   Holding a chair for balance, keep legs shoulder width apart and toes pointed forward. Swing a leg out to side, keeping knee straight. Do not lean. Repeat using other leg. Repeat 10 times. Do 1 sessions per day.  ALSO DO FORWARD KICKS - ALTERNATING - 10 REPS EACH  Feet Heel-Toe "Tandem", Varied Arm Positions - Eyes Open- PARTIAL HEEL TO TOE TO MAKE EASIER   With eyes open, right foot directly in front of the other, arms out, look straight ahead at a stationary object. Hold 30 seconds. Repeat 1  times per session. Do 1 sessions per day.        Standing On One Leg Without Support .  Stand on one leg in neutral spine without support. Hold 10 seconds. Repeat on other leg. Do 2 repetitions, 1 sets.  .    Side-Stepping   Walk to left side with eyes open. Take even steps, leading with same foot. Make sure each foot lifts off the floor. Repeat in opposite direction. Repeat for 2-3 times along counter. Do 1 sessions per day.  CAN DO IN POOL ALSO      AMBULATION: Walk Backward - DO IN POOL (NOT LAND)   Walk backward. Take large steps, do not drag feet.    Braiding - POOL ONLY - NOT LAND    Move to side: 1) cross right leg in front of left, 2) bring back leg out to side, then 3) cross right leg behind left, 4) bring left leg out to side. Continue sequence in same direction. Reverse sequence, moving in opposite direction. Repeat sequence 2 times per session. IN POOL!!  Copyright  VHI. All rights reserved.

## 2022-04-16 ENCOUNTER — Encounter: Payer: Self-pay | Admitting: Physical Therapy

## 2022-04-20 ENCOUNTER — Ambulatory Visit: Payer: Medicare Other | Admitting: Physical Therapy

## 2022-04-20 DIAGNOSIS — R2681 Unsteadiness on feet: Secondary | ICD-10-CM | POA: Diagnosis not present

## 2022-04-20 DIAGNOSIS — M6281 Muscle weakness (generalized): Secondary | ICD-10-CM

## 2022-04-20 NOTE — Therapy (Unsigned)
OUTPATIENT PHYSICAL THERAPY NEURO TREATMENT NOTE   Patient Name: Trevor [name] MRN: [id number] DOB:August 13, 1951, 71 y.o., male Today's Date: 04/21/2022   PCP: Velna Hatchet, MD  REFERRING PROVIDER: Velna Hatchet, MD    PT End of Session - 04/21/22 1745     Visit Number 3    Number of Visits 7    Date for PT Re-Evaluation 05/21/22    Authorization Type Medicare    Authorization Time Period 04-05-22 - 06-12-22    PT Start Time 1150    PT Stop Time 1232    PT Time Calculation (min) 42 min    Activity Tolerance Patient tolerated treatment well    Behavior During Therapy [facility] for tasks assessed/performed               Past Medical History:  Diagnosis Date   Acute pain of left shoulder 04/04/2017   ADD (attention deficit disorder)    Allergy    Anxiety    Arthritis    right foot   Arthritis of midfoot 04/29/2016   Overview:  Added automatically from request for surgery [id number]   Blood transfusion without reported diagnosis    Cataract    removed both eyes with lens implants    Chest wall pain 09/29/2017   Chronic pain of right ankle 04/08/2016   Cough due to ACE inhibitor 03/18/2017   Depression    Descending thoracic aortic aneurysm ([place]) 04/04/2017   Overview:  4.5 cm on CT scan of April 04, 2017.   Diverticulosis of colon 11/25/2015   Dyspnea on exertion 04/04/2017   Essential hypertension 11/25/2015   Executive function deficit 12/31/2015   Family history of colon cancer in mother    dx'd age late 49's   Foot pain, right 04/08/2016   Gait abnormality 03/29/2019   H/O blood clots    Health maintenance examination 09/29/2017   History of colonic polyps 02/04/2011   3 10 mm hyperplastic polyps removed from right and left colon 2004 4 polyps, one 10 mm others diminutive and hyperplastic 2007 Followed closer than routine risk due to multiple large hyperplastic polyps 5 adenomas with one of them serrated  05/29/2015 11 polyps max 10 mm adenomas, ssa's, hyperplastic - 09/21/2017 3  diminutive polyps removed adenomas - recall 2021 (2 yrs)      Hypercholesterolemia 11/25/2015   Hyperlipidemia    Hypertension    Left foot pain 01/27/2016   Nocturia more than twice per night 12/31/2015   Pain in joints 12/31/2015   Pectoralis major tendinitis, right 03/18/2017   Pericarditis    Peripheral neuropathy 04/24/2018   Pneumonia    Primary osteoarthritis of right foot 11/14/2015   Shortness of breath 09/29/2017   Status post right foot surgery 06/23/2016   Strain of right pectoralis muscle 09/07/2016   Thoracic aortic aneurysm ([place]) 06/08/2017   Traumatic brain injury ([place])    1976   Ulcer    Varicose veins    Varicose veins of lower extremities with ulcer ([place]) 11/16/2012   Past Surgical History:  Procedure Laterality Date   BURR HOLE OF CRANIUM  04/10/75   COLONOSCOPY     COLONOSCOPY W/ POLYPECTOMY  2004, 2007, 02/03/11   multiple large hyperplastic polyps 04/07, adenomas and one serrated 2012   ENDOVENOUS ABLATION SAPHENOUS VEIN W/ LASER Left 09-02-2010   EVLA left great and small saphenous vein by Curt Jews MD    FOOT FUSION Right    [facility]   neck fusion  POLYPECTOMY     ROTATOR CUFF REPAIR Right 2019   rt. knee replacement     THORACIC AORTIC ENDOVASCULAR STENT GRAFT N/A 06/08/2017   Procedure: THORACIC AORTIC ENDOVASCULAR STENT GRAFT;  Surgeon: Rosetta Posner, MD;  Location: [place];  Service: Vascular;  Laterality: N/A;   Patient Active Problem List   Diagnosis Date Noted   Gait abnormality 03/29/2019   Peripheral neuropathy 04/24/2018   S/P rotator cuff repair 01/31/2018   Nontraumatic tear of right supraspinatus tendon 01/19/2018   Brachial neuritis or radiculitis 11/24/2017   Chest wall pain 09/29/2017   Shortness of breath 09/29/2017   Health maintenance examination 09/29/2017   Thoracic aortic aneurysm ([place]) 06/08/2017   Acute pain of left shoulder 04/04/2017   Descending thoracic aortic aneurysm ([place]) 04/04/2017   Dyspnea on exertion  04/04/2017   Cough due to ACE inhibitor 03/18/2017   Pectoralis major tendinitis, right 03/18/2017   Strain of right pectoralis muscle 09/07/2016   Status post right foot surgery 06/23/2016   Arthritis of midfoot 04/29/2016   Chronic pain of right ankle 04/08/2016   Foot pain, right 04/08/2016   Left foot pain 01/27/2016   Executive function deficit 12/31/2015   Nocturia more than twice per night 12/31/2015   Pain in joints 12/31/2015   ADD (attention deficit disorder) 11/25/2015   Depression 11/25/2015   Diverticulosis of colon 11/25/2015   Essential hypertension 11/25/2015   Hypercholesterolemia 11/25/2015   Primary osteoarthritis of right foot 11/14/2015   Family history of rectal cancer in elderly mother 05/29/2015   Varicose veins of lower extremities with ulcer ([place]) 11/16/2012   History of colonic polyps 02/04/2011    ONSET DATE: 2017 for Rt foot surgery:  Referral date 03-23-22  REFERRING DIAG: Balance disorder:  Charcot's joint Rt foot  THERAPY DIAG:  Unsteadiness on feet  Muscle weakness (generalized)  Rationale for Evaluation and Treatment Rehabilitation  SUBJECTIVE:                                                                                                                                                                                              SUBJECTIVE STATEMENT: Pt reports he did exercises at home - held onto counter for support;  pt reports no changes since previous PT session last week; reports no changes since last PT session   Pt accompanied by: self  PERTINENT HISTORY: h/o TBI 1976, Charcot's joint of Rt foot - s/p Rt foot surgery 06-23-16 (foot fusion), chronic venous insufficiency with current ulcer on RLE and Rt medial malleolus, HTN, peripheral neuropathy, h/o thoracic aortic aneurysm, h/o neck fusion, s/p thoracic aortic endovascular  stent graft, primary OA Rt foot  PAIN:  Are you having pain? No  PATIENT GOALS: improve balance and leg  strength  OBJECTIVE:     TREATMENT 04-20-22:  Neuro Re-ed:   Pt performed SLS activities to facilitate improved SLS on both legs; pt performed stepping over and back of black balance beam 5 reps each leg inside // bars with UE support prn;  stepping laterally over balance beam inside // bars 5 reps each leg with UE support prn  Pt performed standing on AirEx pad - standing static with EO for 10 sec hold; standing with EC for 10 secs with SBA - pt did lose balance with either of these exercises;  pt performed standing on Airex with feet shoulder width apart - performed 5 slow horizontal head turns and then 5 slow vertical head turns - without UE support (pt had no LOB)  Marching on Airex pad 10 reps each leg with UE support prn; added head turns with marching with minimal UE support due to unsteadiness  Rockerboard 10 reps anterior/posteriorly; rocking forward stepping forward/back 5 reps each leg with UE support prn  Tap ups to 6" step placed inside // bars - 10 reps each foot- no UE support needed  Pt performed cone taps to 2 cones without UE support - straight ahead 5 reps and then diagonal taps 5 reps each foot with UE support on // bars prn with LOB  TherEx:   Sit to stand from mat table with bil. UE support x 5 reps for bil. LE strengthening  Step ups 10 reps each leg onto 6" step - with bil. UE support due to c/o Lt knee pain with this exercise  Bilateral hamstring/gastroc stretch (runner's stretch) with foot on 1st step - 30 sec hold x 1 rep each leg Bil. Heel cord stretch with heels hanging off edge of step - 30 sec hold x 1 rep  Leg press - 70# bil. LE's 3 sets 10 reps with cues for controlled eccentric return    HOME EXERCISE PROGRAM:   No changes made to HEP on 04-20-22   GOALS: Goals reviewed with patient? Yes  TARGET DATE 04-30-22   PT Short Term Goals - 04/06/22 1951       PT SHORT TERM GOAL #1   Title Independent in HEP for bil. LE strengthening and balance  exercises.    Time 3    Period Weeks    Status New    Target Date 04/30/22      PT SHORT TERM GOAL #2   Title Improve TUG score from 15.28 secs with no device to </= 13.0 secs for reduced fall risk.    Time 3    Period Weeks    Status New    Target Date 04/30/22      PT SHORT TERM GOAL #3   Title Pt will improve 5x sit to stand score to </= 17 secs with bil. UE support from standard chair to demo improved LE strength.    Baseline 20.81 secs    Time 3    Period Weeks    Status New    Target Date 04/30/22               LONG TERM GOALS: Target date: 05/21/2022    PT Long Term Goals - 04/06/22 1955       PT LONG TERM GOAL #1   Title Increase TUG score from 15.28 secs to </= 12.0 secs without device for  reduced fall risk.    Baseline 15.28 secs    Time 6    Period Weeks    Status New    Target Date 05/21/22      PT LONG TERM GOAL #2   Title Increase gait velocity from 2.71 ft/sec to >/= 3.0 ft/sec without device for increased gait effiiciency.    Baseline 12.09 = 2.71 ft/sec without device    Time 6    Period Weeks    Status New    Target Date 05/21/22      PT LONG TERM GOAL #3   Title Improve balance so pt able to perform SLS on each leg for at least 4 secs on LLE and at least 2 secs on RLE to increase safety with stepping over objects.    Baseline 3.22 secs on LLE:  unable to perform SLS on RLE    Time 6    Period Weeks    Status New    Target Date 05/21/22      PT LONG TERM GOAL #4   Title Improve LE strength so pt able to perform sit to stand from standard chair with 1 UE support.    Baseline bil. UE support needed    Time 6    Period Weeks    Status New    Target Date 05/21/22             ASSESSMENT:  CLINICAL IMPRESSION: Pt performed strengthening exercises well with moderate difficulty with Lt step up exercise due to c/o Lt knee pain - therefore, pt used increased UE support on rails to compensate and to reduce LLE weight bearing.  Pt  performed SLS activities well with only minimal intermittent UE support needed for recovery of LOB.  Pt continues to ambulate without use of assistive device without LOB and unsteadiness.   Pt is progressing well towards goals.  Cont with POC.    OBJECTIVE IMPAIRMENTS decreased activity tolerance, decreased balance, difficulty walking, decreased ROM, decreased strength, and impaired flexibility.   ACTIVITY LIMITATIONS carrying, bending, squatting, stairs, transfers, and locomotion level  PARTICIPATION LIMITATIONS: meal prep, cleaning, laundry, shopping, community activity, and yard work  PERSONAL FACTORS Behavior pattern, Time since onset of injury/illness/exacerbation, and 1 comorbidity: Rt Charcot foot with limited ROM due to ankle fusion  are also affecting patient's functional outcome.   REHAB POTENTIAL: Fair due to chronicity of musculoskeletal deficits which are impacting balance  CLINICAL DECISION MAKING: Stable/uncomplicated  EVALUATION COMPLEXITY: Low  PLAN: PT FREQUENCY: 1x/week  PT DURATION: 6 weeks  PLANNED INTERVENTIONS: Therapeutic exercises, Therapeutic activity, Neuromuscular re-education, Balance training, Gait training, Patient/Family education, Self Care, Stair training, and Orthotic/Fit training  PLAN FOR NEXT SESSION:    check STG's;  continue balance and LE strengthening exercises   Hoang Reich, [place], PT 04/21/2022, 5:47 PM

## 2022-04-21 ENCOUNTER — Encounter: Payer: Self-pay | Admitting: Physical Therapy

## 2022-04-27 ENCOUNTER — Ambulatory Visit: Payer: Medicare Other | Admitting: Vascular Surgery

## 2022-05-03 ENCOUNTER — Ambulatory Visit: Payer: Medicare Other | Admitting: Physical Therapy

## 2022-05-03 DIAGNOSIS — R2681 Unsteadiness on feet: Secondary | ICD-10-CM

## 2022-05-03 DIAGNOSIS — M6281 Muscle weakness (generalized): Secondary | ICD-10-CM

## 2022-05-03 NOTE — Therapy (Unsigned)
OUTPATIENT PHYSICAL THERAPY NEURO TREATMENT NOTE   Patient Name: Trevor [name] MRN: [id number] DOB:January 02, 1951, 71 y.o., male Today's Date: 05/04/2022   PCP: Velna Hatchet, MD  REFERRING PROVIDER: Velna Hatchet, MD    PT End of Session - 05/04/22 1413     Visit Number 4    Number of Visits 7    Date for PT Re-Evaluation 05/21/22    Authorization Type Medicare    Authorization Time Period 04-05-22 - 06-12-22    PT Start Time 1105    PT Stop Time 1150    PT Time Calculation (min) 45 min    Activity Tolerance Patient tolerated treatment well    Behavior During Therapy [facility] for tasks assessed/performed                Past Medical History:  Diagnosis Date   Acute pain of left shoulder 04/04/2017   ADD (attention deficit disorder)    Allergy    Anxiety    Arthritis    right foot   Arthritis of midfoot 04/29/2016   Overview:  Added automatically from request for surgery [id number]   Blood transfusion without reported diagnosis    Cataract    removed both eyes with lens implants    Chest wall pain 09/29/2017   Chronic pain of right ankle 04/08/2016   Cough due to ACE inhibitor 03/18/2017   Depression    Descending thoracic aortic aneurysm ([place]) 04/04/2017   Overview:  4.5 cm on CT scan of April 04, 2017.   Diverticulosis of colon 11/25/2015   Dyspnea on exertion 04/04/2017   Essential hypertension 11/25/2015   Executive function deficit 12/31/2015   Family history of colon cancer in mother    dx'd age late 82's   Foot pain, right 04/08/2016   Gait abnormality 03/29/2019   H/O blood clots    Health maintenance examination 09/29/2017   History of colonic polyps 02/04/2011   3 10 mm hyperplastic polyps removed from right and left colon 2004 4 polyps, one 10 mm others diminutive and hyperplastic 2007 Followed closer than routine risk due to multiple large hyperplastic polyps 5 adenomas with one of them serrated  05/29/2015 11 polyps max 10 mm adenomas, ssa's, hyperplastic - 09/21/2017 3  diminutive polyps removed adenomas - recall 2021 (2 yrs)      Hypercholesterolemia 11/25/2015   Hyperlipidemia    Hypertension    Left foot pain 01/27/2016   Nocturia more than twice per night 12/31/2015   Pain in joints 12/31/2015   Pectoralis major tendinitis, right 03/18/2017   Pericarditis    Peripheral neuropathy 04/24/2018   Pneumonia    Primary osteoarthritis of right foot 11/14/2015   Shortness of breath 09/29/2017   Status post right foot surgery 06/23/2016   Strain of right pectoralis muscle 09/07/2016   Thoracic aortic aneurysm ([place]) 06/08/2017   Traumatic brain injury ([place])    1976   Ulcer    Varicose veins    Varicose veins of lower extremities with ulcer ([place]) 11/16/2012   Past Surgical History:  Procedure Laterality Date   BURR HOLE OF CRANIUM  04/10/75   COLONOSCOPY     COLONOSCOPY W/ POLYPECTOMY  2004, 2007, 02/03/11   multiple large hyperplastic polyps 04/07, adenomas and one serrated 2012   ENDOVENOUS ABLATION SAPHENOUS VEIN W/ LASER Left 09-02-2010   EVLA left great and small saphenous vein by Curt Jews MD    FOOT FUSION Right    [facility]   neck fusion  POLYPECTOMY     ROTATOR CUFF REPAIR Right 2019   rt. knee replacement     THORACIC AORTIC ENDOVASCULAR STENT GRAFT N/A 06/08/2017   Procedure: THORACIC AORTIC ENDOVASCULAR STENT GRAFT;  Surgeon: Rosetta Posner, MD;  Location: [place];  Service: Vascular;  Laterality: N/A;   Patient Active Problem List   Diagnosis Date Noted   Gait abnormality 03/29/2019   Peripheral neuropathy 04/24/2018   S/P rotator cuff repair 01/31/2018   Nontraumatic tear of right supraspinatus tendon 01/19/2018   Brachial neuritis or radiculitis 11/24/2017   Chest wall pain 09/29/2017   Shortness of breath 09/29/2017   Health maintenance examination 09/29/2017   Thoracic aortic aneurysm ([place]) 06/08/2017   Acute pain of left shoulder 04/04/2017   Descending thoracic aortic aneurysm ([place]) 04/04/2017   Dyspnea on exertion  04/04/2017   Cough due to ACE inhibitor 03/18/2017   Pectoralis major tendinitis, right 03/18/2017   Strain of right pectoralis muscle 09/07/2016   Status post right foot surgery 06/23/2016   Arthritis of midfoot 04/29/2016   Chronic pain of right ankle 04/08/2016   Foot pain, right 04/08/2016   Left foot pain 01/27/2016   Executive function deficit 12/31/2015   Nocturia more than twice per night 12/31/2015   Pain in joints 12/31/2015   ADD (attention deficit disorder) 11/25/2015   Depression 11/25/2015   Diverticulosis of colon 11/25/2015   Essential hypertension 11/25/2015   Hypercholesterolemia 11/25/2015   Primary osteoarthritis of right foot 11/14/2015   Family history of rectal cancer in elderly mother 05/29/2015   Varicose veins of lower extremities with ulcer ([place]) 11/16/2012   History of colonic polyps 02/04/2011    ONSET DATE: 2017 for Rt foot surgery:  Referral date 03-23-22  REFERRING DIAG: Balance disorder:  Charcot's joint Rt foot  THERAPY DIAG:  Unsteadiness on feet  Muscle weakness (generalized)  Rationale for Evaluation and Treatment Rehabilitation  SUBJECTIVE:                                                                                                                                                                                              SUBJECTIVE STATEMENT: Pt reports he feels balance is improving - states "it's not where I want it to be but it's better"   Pt accompanied by: self  PERTINENT HISTORY: h/o TBI 1976, Charcot's joint of Rt foot - s/p Rt foot surgery 06-23-16 (foot fusion), chronic venous insufficiency with current ulcer on RLE and Rt medial malleolus, HTN, peripheral neuropathy, h/o thoracic aortic aneurysm, h/o neck fusion, s/p thoracic aortic endovascular stent graft, primary OA Rt foot  PAIN:  Are you  having pain? No  PATIENT GOALS: improve balance and leg strength  OBJECTIVE:     TREATMENT 05-03-22:  Neuro Re-ed:    Pt  performed standing on AirEx pad - standing static with EO for 10 sec hold; standing with EC for 10 secs with SBA;  pt performed standing on Airex with feet shoulder width apart - performed 5 slow horizontal head turns and then 5 slow vertical head turns - without UE support - pt had no LOB  Marching on Airex pad 10 reps each leg with UE support prn  Rockerboard 10 reps anterior/posteriorly; rocking forward stepping forward/back 5 reps each leg with UE support prn  Pt performed cone taps to 2 cones without UE support - straight ahead 5 reps and then diagonal taps 5 reps each foot with UE support on // bars prn with LOB  Pt performed forward, back and side kicks 10 reps each leg alternating - standing on Airex pad inside // bars with bil. UE support    TherEx:   Sit to stand from mat table with bil. UE support x 3 reps for bil. LE strengthening  Step ups 10 reps RLE onto 6" step - with bil. UE support on rails  Bilateral hamstring/gastroc stretch (runner's stretch) with foot on 1st step - 30 sec hold x 1 rep each leg  Bil. Heel cord stretch with heels hanging off edge of step - 30 sec hold x 1 rep  Leg press - 70# bil. LE's 3 sets 10 reps with cues for controlled eccentric return    HOME EXERCISE PROGRAM:   No changes made to HEP on 04-20-22   GOALS: Goals reviewed with patient? Yes  TARGET DATE 04-30-22   PT Short Term Goals - 04/06/22 1951       PT SHORT TERM GOAL #1   Title Independent in HEP for bil. LE strengthening and balance exercises.    Time 3    Period Weeks    Status Goal met - 05-03-22   Target Date 04/30/22      PT SHORT TERM GOAL #2   Title Improve TUG score from 15.28 secs with no device to </= 13.0 secs for reduced fall risk.    Time 3    Period Weeks    Status Partially met - 13.34 secs    Target Date 04/30/22      PT SHORT TERM GOAL #3   Title Pt will improve 5x sit to stand score to </= 17 secs with bil. UE support from standard chair to demo improved  LE strength.    Baseline 20.81 secs;  19.28 secs with UE support   Time 3    Period Weeks    Status Not met   Target Date 04/30/22               LONG TERM GOALS: Target date: 05/21/2022    PT Long Term Goals - 04/06/22 1955       PT LONG TERM GOAL #1   Title Increase TUG score from 15.28 secs to </= 12.0 secs without device for reduced fall risk.    Baseline 15.28 secs    Time 6    Period Weeks    Status New    Target Date 05/21/22      PT LONG TERM GOAL #2   Title Increase gait velocity from 2.71 ft/sec to >/= 3.0 ft/sec without device for increased gait effiiciency.    Baseline 12.09 = 2.71 ft/sec without device  Time 6    Period Weeks    Status New    Target Date 05/21/22      PT LONG TERM GOAL #3   Title Improve balance so pt able to perform SLS on each leg for at least 4 secs on LLE and at least 2 secs on RLE to increase safety with stepping over objects.    Baseline 3.22 secs on LLE:  unable to perform SLS on RLE    Time 6    Period Weeks    Status New    Target Date 05/21/22      PT LONG TERM GOAL #4   Title Improve LE strength so pt able to perform sit to stand from standard chair with 1 UE support.    Baseline bil. UE support needed    Time 6    Period Weeks    Status New    Target Date 05/21/22             ASSESSMENT:  CLINICAL IMPRESSION: PT session focused on strengthening exercises for bil. LE's and also on balance training on compliant surfaces.  Pt needs UE support for safety with standing balance exercises performed on compliant surfaces due to SLS deficits on each leg. Lt knee pain with weight bearing also impacts pt's ability to ascend steps easily without discomfort.  Cont with POC.    OBJECTIVE IMPAIRMENTS decreased activity tolerance, decreased balance, difficulty walking, decreased ROM, decreased strength, and impaired flexibility.   ACTIVITY LIMITATIONS carrying, bending, squatting, stairs, transfers, and locomotion  level  PARTICIPATION LIMITATIONS: meal prep, cleaning, laundry, shopping, community activity, and yard work  PERSONAL FACTORS Behavior pattern, Time since onset of injury/illness/exacerbation, and 1 comorbidity: Rt Charcot foot with limited ROM due to ankle fusion  are also affecting patient's functional outcome.   REHAB POTENTIAL: Fair due to chronicity of musculoskeletal deficits which are impacting balance  CLINICAL DECISION MAKING: Stable/uncomplicated  EVALUATION COMPLEXITY: Low  PLAN: PT FREQUENCY: 1x/week  PT DURATION: 6 weeks  PLANNED INTERVENTIONS: Therapeutic exercises, Therapeutic activity, Neuromuscular re-education, Balance training, Gait training, Patient/Family education, Self Care, Stair training, and Orthotic/Fit training  PLAN FOR NEXT SESSION:    Continue balance and LE strengthening exercises   Keturah Yerby, [place], PT 05/04/2022, 2:16 PM

## 2022-05-04 ENCOUNTER — Encounter: Payer: Self-pay | Admitting: Physical Therapy

## 2022-05-10 ENCOUNTER — Ambulatory Visit: Payer: Medicare Other | Admitting: Physical Therapy

## 2022-05-10 DIAGNOSIS — R2681 Unsteadiness on feet: Secondary | ICD-10-CM

## 2022-05-10 DIAGNOSIS — M6281 Muscle weakness (generalized): Secondary | ICD-10-CM | POA: Diagnosis not present

## 2022-05-10 NOTE — Therapy (Signed)
OUTPATIENT PHYSICAL THERAPY NEURO TREATMENT NOTE   Patient Name: Trevor [name] MRN: [id number] DOB:[birth year], 71 y.o., male Today's Date: 05/11/2022   PCP: Velna Hatchet, MD  REFERRING PROVIDER: Velna Hatchet, MD    PT End of Session - 05/11/22 2030     Visit Number 5    Number of Visits 7    Date for PT Re-Evaluation 05/21/22    Authorization Type Medicare    Authorization Time Period 04-05-22 - 06-12-22    PT Start Time 1104    PT Stop Time 1147    PT Time Calculation (min) 43 min    Activity Tolerance Patient tolerated treatment well    Behavior During Therapy [facility] for tasks assessed/performed                 Past Medical History:  Diagnosis Date   Acute pain of left shoulder 04/04/2017   ADD (attention deficit disorder)    Allergy    Anxiety    Arthritis    right foot   Arthritis of midfoot 04/29/2016   Overview:  Added automatically from request for surgery [id number]   Blood transfusion without reported diagnosis    Cataract    removed both eyes with lens implants    Chest wall pain 09/29/2017   Chronic pain of right ankle 04/08/2016   Cough due to ACE inhibitor 03/18/2017   Depression    Descending thoracic aortic aneurysm ([place]) 04/04/2017   Overview:  4.5 cm on CT scan of April 04, 2017.   Diverticulosis of colon 11/25/2015   Dyspnea on exertion 04/04/2017   Essential hypertension 11/25/2015   Executive function deficit 12/31/2015   Family history of colon cancer in mother    dx'd age late 51's   Foot pain, right 04/08/2016   Gait abnormality 03/29/2019   H/O blood clots    Health maintenance examination 09/29/2017   History of colonic polyps 02/04/2011   3 10 mm hyperplastic polyps removed from right and left colon 2004 4 polyps, one 10 mm others diminutive and hyperplastic 2007 Followed closer than routine risk due to multiple large hyperplastic polyps 5 adenomas with one of them serrated  05/29/2015 11 polyps max 10 mm adenomas, ssa's, hyperplastic - 09/21/2017  3 diminutive polyps removed adenomas - recall 2021 (2 yrs)      Hypercholesterolemia 11/25/2015   Hyperlipidemia    Hypertension    Left foot pain 01/27/2016   Nocturia more than twice per night 12/31/2015   Pain in joints 12/31/2015   Pectoralis major tendinitis, right 03/18/2017   Pericarditis    Peripheral neuropathy 04/24/2018   Pneumonia    Primary osteoarthritis of right foot 11/14/2015   Shortness of breath 09/29/2017   Status post right foot surgery 06/23/2016   Strain of right pectoralis muscle 09/07/2016   Thoracic aortic aneurysm ([place]) 06/08/2017   Traumatic brain injury ([place])    1976   Ulcer    Varicose veins    Varicose veins of lower extremities with ulcer ([place]) 11/16/2012   Past Surgical History:  Procedure Laterality Date   BURR HOLE OF CRANIUM  04/10/75   COLONOSCOPY     COLONOSCOPY W/ POLYPECTOMY  2004, 2007, 02/03/11   multiple large hyperplastic polyps 04/07, adenomas and one serrated 2012   ENDOVENOUS ABLATION SAPHENOUS VEIN W/ LASER Left 09-02-2010   EVLA left great and small saphenous vein by Curt Jews MD    FOOT FUSION Right    [facility]   neck fusion  POLYPECTOMY     ROTATOR CUFF REPAIR Right 2019   rt. knee replacement     THORACIC AORTIC ENDOVASCULAR STENT GRAFT N/A 06/08/2017   Procedure: THORACIC AORTIC ENDOVASCULAR STENT GRAFT;  Surgeon: Rosetta Posner, MD;  Location: [place];  Service: Vascular;  Laterality: N/A;   Patient Active Problem List   Diagnosis Date Noted   Gait abnormality 03/29/2019   Peripheral neuropathy 04/24/2018   S/P rotator cuff repair 01/31/2018   Nontraumatic tear of right supraspinatus tendon 01/19/2018   Brachial neuritis or radiculitis 11/24/2017   Chest wall pain 09/29/2017   Shortness of breath 09/29/2017   Health maintenance examination 09/29/2017   Thoracic aortic aneurysm ([place]) 06/08/2017   Acute pain of left shoulder 04/04/2017   Descending thoracic aortic aneurysm ([place]) 04/04/2017   Dyspnea on exertion  04/04/2017   Cough due to ACE inhibitor 03/18/2017   Pectoralis major tendinitis, right 03/18/2017   Strain of right pectoralis muscle 09/07/2016   Status post right foot surgery 06/23/2016   Arthritis of midfoot 04/29/2016   Chronic pain of right ankle 04/08/2016   Foot pain, right 04/08/2016   Left foot pain 01/27/2016   Executive function deficit 12/31/2015   Nocturia more than twice per night 12/31/2015   Pain in joints 12/31/2015   ADD (attention deficit disorder) 11/25/2015   Depression 11/25/2015   Diverticulosis of colon 11/25/2015   Essential hypertension 11/25/2015   Hypercholesterolemia 11/25/2015   Primary osteoarthritis of right foot 11/14/2015   Family history of rectal cancer in elderly mother 05/29/2015   Varicose veins of lower extremities with ulcer ([place]) 11/16/2012   History of colonic polyps 02/04/2011    ONSET DATE: 2017 for Rt foot surgery:  Referral date 03-23-22  REFERRING DIAG: Balance disorder:  Charcot's joint Rt foot  THERAPY DIAG:  Unsteadiness on feet  Muscle weakness (generalized)  Rationale for Evaluation and Treatment Rehabilitation  SUBJECTIVE:                                                                                                                                                                                              SUBJECTIVE STATEMENT: Pt reports no changes since previous session last week - states things are about the same  Pt accompanied by: self  PERTINENT HISTORY: h/o TBI 1976, Charcot's joint of Rt foot - s/p Rt foot surgery 06-23-16 (foot fusion), chronic venous insufficiency with current ulcer on RLE and Rt medial malleolus, HTN, peripheral neuropathy, h/o thoracic aortic aneurysm, h/o neck fusion, s/p thoracic aortic endovascular stent graft, primary OA Rt foot  PAIN:  Are you having pain? No  PATIENT  GOALS: improve balance and leg strength  OBJECTIVE:     TREATMENT 05-10-22:  Neuro Re-ed:    Pt performed  standing on AirEx pad - standing static with EO for 5 sec hold; standing with EC for 10 secs with SBA;  pt performed standing on Airex with feet shoulder width apart - performed 5 slow horizontal head turns and then 5 slow vertical head turns - without UE support with CGA  Marching on Airex pad 10 reps each leg with UE support prn  Rockerboard 10 reps anterior/posteriorly; rocking forward stepping forward/back 5 reps each leg with UE support prn  Pt performed cone taps to 2 cones without UE support - straight ahead 5 reps and then diagonal taps 5 reps each foot with UE support on // bars prn with LOB  Pt performed forward, back and side kicks 5 reps each leg alternating - standing on Airex pad inside // bars with bil. UE support    TherEx:   Sit to stand from mat table with bil. UE support x 5 reps for bil. LE strengthening  Step ups 10 reps RLE onto 6" step - with bil. UE support on rails  Bilateral hamstring/gastroc stretch (runner's stretch) with foot on 1st step - 30 sec hold x 1 rep each leg  Bil. Heel cord stretch with heels hanging off edge of step - 30 sec hold x 1 rep  Leg press - 70# bil. LE's 3 sets 10 reps with cues for controlled eccentric return    HOME EXERCISE PROGRAM:   No changes made to HEP on 04-20-22   GOALS: Goals reviewed with patient? Yes  TARGET DATE 04-30-22   PT Short Term Goals - 04/06/22 1951       PT SHORT TERM GOAL #1   Title Independent in HEP for bil. LE strengthening and balance exercises.    Time 3    Period Weeks    Status Goal met - 05-03-22   Target Date 04/30/22      PT SHORT TERM GOAL #2   Title Improve TUG score from 15.28 secs with no device to </= 13.0 secs for reduced fall risk.    Time 3    Period Weeks    Status Partially met - 13.34 secs    Target Date 04/30/22      PT SHORT TERM GOAL #3   Title Pt will improve 5x sit to stand score to </= 17 secs with bil. UE support from standard chair to demo improved LE strength.     Baseline 20.81 secs;  19.28 secs with UE support   Time 3    Period Weeks    Status Not met   Target Date 04/30/22               LONG TERM GOALS: Target date: 05/21/2022    PT Long Term Goals - 04/06/22 1955       PT LONG TERM GOAL #1   Title Increase TUG score from 15.28 secs to </= 12.0 secs without device for reduced fall risk.    Baseline 15.28 secs    Time 6    Period Weeks    Status New    Target Date 05/21/22      PT LONG TERM GOAL #2   Title Increase gait velocity from 2.71 ft/sec to >/= 3.0 ft/sec without device for increased gait effiiciency.    Baseline 12.09 = 2.71 ft/sec without device    Time 6  Period Weeks    Status New    Target Date 05/21/22      PT LONG TERM GOAL #3   Title Improve balance so pt able to perform SLS on each leg for at least 4 secs on LLE and at least 2 secs on RLE to increase safety with stepping over objects.    Baseline 3.22 secs on LLE:  unable to perform SLS on RLE    Time 6    Period Weeks    Status New    Target Date 05/21/22      PT LONG TERM GOAL #4   Title Improve LE strength so pt able to perform sit to stand from standard chair with 1 UE support.    Baseline bil. UE support needed    Time 6    Period Weeks    Status New    Target Date 05/21/22             ASSESSMENT:  CLINICAL IMPRESSION: Pt is progressing with balance with SLS activities, but continues to be limited by orthopedic issues in each LE and also limited by Lt knee pain which prevents pt from being able to ascend steps with LLE leading due to c/o pain.  Cont with POC.    OBJECTIVE IMPAIRMENTS decreased activity tolerance, decreased balance, difficulty walking, decreased ROM, decreased strength, and impaired flexibility.   ACTIVITY LIMITATIONS carrying, bending, squatting, stairs, transfers, and locomotion level  PARTICIPATION LIMITATIONS: meal prep, cleaning, laundry, shopping, community activity, and yard work  PERSONAL FACTORS Behavior  pattern, Time since onset of injury/illness/exacerbation, and 1 comorbidity: Rt Charcot foot with limited ROM due to ankle fusion  are also affecting patient's functional outcome.   REHAB POTENTIAL: Fair due to chronicity of musculoskeletal deficits which are impacting balance  CLINICAL DECISION MAKING: Stable/uncomplicated  EVALUATION COMPLEXITY: Low  PLAN: PT FREQUENCY: 1x/week  PT DURATION: 6 weeks  PLANNED INTERVENTIONS: Therapeutic exercises, Therapeutic activity, Neuromuscular re-education, Balance training, Gait training, Patient/Family education, Self Care, Stair training, and Orthotic/Fit training  PLAN FOR NEXT SESSION:    Continue balance and LE strengthening exercises   Rainn Zupko, [place], PT 05/11/2022, 8:33 PM

## 2022-05-11 ENCOUNTER — Encounter: Payer: Self-pay | Admitting: Physical Therapy

## 2022-05-18 ENCOUNTER — Ambulatory Visit: Payer: Medicare Other | Attending: Internal Medicine | Admitting: Physical Therapy

## 2022-05-18 DIAGNOSIS — R2689 Other abnormalities of gait and mobility: Secondary | ICD-10-CM | POA: Diagnosis not present

## 2022-05-18 DIAGNOSIS — M6281 Muscle weakness (generalized): Secondary | ICD-10-CM | POA: Diagnosis not present

## 2022-05-18 DIAGNOSIS — R2681 Unsteadiness on feet: Secondary | ICD-10-CM | POA: Diagnosis not present

## 2022-05-18 NOTE — Therapy (Unsigned)
OUTPATIENT PHYSICAL THERAPY NEURO TREATMENT NOTE   Patient Name: Trevor [name] MRN: [id number] DOB:May 04, 1951, 71 y.o., male Today's Date: 05/19/2022   PCP: Velna Hatchet, MD  REFERRING PROVIDER: Velna Hatchet, MD    PT End of Session - 05/19/22 1239     Visit Number 6    Number of Visits 7    Date for PT Re-Evaluation 05/21/22    Authorization Type Medicare    Authorization Time Period 04-05-22 - 06-12-22    PT Start Time 1158   pt arrived 63" late for appt   PT Stop Time 1233    PT Time Calculation (min) 35 min    Activity Tolerance Patient tolerated treatment well    Behavior During Therapy [facility] for tasks assessed/performed                  Past Medical History:  Diagnosis Date   Acute pain of left shoulder 04/04/2017   ADD (attention deficit disorder)    Allergy    Anxiety    Arthritis    right foot   Arthritis of midfoot 04/29/2016   Overview:  Added automatically from request for surgery [id number]   Blood transfusion without reported diagnosis    Cataract    removed both eyes with lens implants    Chest wall pain 09/29/2017   Chronic pain of right ankle 04/08/2016   Cough due to ACE inhibitor 03/18/2017   Depression    Descending thoracic aortic aneurysm ([place]) 04/04/2017   Overview:  4.5 cm on CT scan of April 04, 2017.   Diverticulosis of colon 11/25/2015   Dyspnea on exertion 04/04/2017   Essential hypertension 11/25/2015   Executive function deficit 12/31/2015   Family history of colon cancer in mother    dx'd age late 78's   Foot pain, right 04/08/2016   Gait abnormality 03/29/2019   H/O blood clots    Health maintenance examination 09/29/2017   History of colonic polyps 02/04/2011   3 10 mm hyperplastic polyps removed from right and left colon 2004 4 polyps, one 10 mm others diminutive and hyperplastic 2007 Followed closer than routine risk due to multiple large hyperplastic polyps 5 adenomas with one of them serrated  05/29/2015 11 polyps max 10 mm adenomas,  ssa's, hyperplastic - 09/21/2017 3 diminutive polyps removed adenomas - recall 2021 (2 yrs)      Hypercholesterolemia 11/25/2015   Hyperlipidemia    Hypertension    Left foot pain 01/27/2016   Nocturia more than twice per night 12/31/2015   Pain in joints 12/31/2015   Pectoralis major tendinitis, right 03/18/2017   Pericarditis    Peripheral neuropathy 04/24/2018   Pneumonia    Primary osteoarthritis of right foot 11/14/2015   Shortness of breath 09/29/2017   Status post right foot surgery 06/23/2016   Strain of right pectoralis muscle 09/07/2016   Thoracic aortic aneurysm ([place]) 06/08/2017   Traumatic brain injury ([place])    1976   Ulcer    Varicose veins    Varicose veins of lower extremities with ulcer ([place]) 11/16/2012   Past Surgical History:  Procedure Laterality Date   BURR HOLE OF CRANIUM  04/10/75   COLONOSCOPY     COLONOSCOPY W/ POLYPECTOMY  2004, 2007, 02/03/11   multiple large hyperplastic polyps 04/07, adenomas and one serrated 2012   ENDOVENOUS ABLATION SAPHENOUS VEIN W/ LASER Left 09-02-2010   EVLA left great and small saphenous vein by Curt Jews MD    FOOT FUSION Right  [facility]   neck fusion     POLYPECTOMY     ROTATOR CUFF REPAIR Right 2019   rt. knee replacement     THORACIC AORTIC ENDOVASCULAR STENT GRAFT N/A 06/08/2017   Procedure: THORACIC AORTIC ENDOVASCULAR STENT GRAFT;  Surgeon: Rosetta Posner, MD;  Location: [facility] OR;  Service: Vascular;  Laterality: N/A;   Patient Active Problem List   Diagnosis Date Noted   Gait abnormality 03/29/2019   Peripheral neuropathy 04/24/2018   S/P rotator cuff repair 01/31/2018   Nontraumatic tear of right supraspinatus tendon 01/19/2018   Brachial neuritis or radiculitis 11/24/2017   Chest wall pain 09/29/2017   Shortness of breath 09/29/2017   Health maintenance examination 09/29/2017   Thoracic aortic aneurysm ([place]) 06/08/2017   Acute pain of left shoulder 04/04/2017   Descending thoracic aortic aneurysm ([place])  04/04/2017   Dyspnea on exertion 04/04/2017   Cough due to ACE inhibitor 03/18/2017   Pectoralis major tendinitis, right 03/18/2017   Strain of right pectoralis muscle 09/07/2016   Status post right foot surgery 06/23/2016   Arthritis of midfoot 04/29/2016   Chronic pain of right ankle 04/08/2016   Foot pain, right 04/08/2016   Left foot pain 01/27/2016   Executive function deficit 12/31/2015   Nocturia more than twice per night 12/31/2015   Pain in joints 12/31/2015   ADD (attention deficit disorder) 11/25/2015   Depression 11/25/2015   Diverticulosis of colon 11/25/2015   Essential hypertension 11/25/2015   Hypercholesterolemia 11/25/2015   Primary osteoarthritis of right foot 11/14/2015   Family history of rectal cancer in elderly mother 05/29/2015   Varicose veins of lower extremities with ulcer ([place]) 11/16/2012   History of colonic polyps 02/04/2011    ONSET DATE: 2017 for Rt foot surgery:  Referral date 03-23-22  REFERRING DIAG: Balance disorder:  Charcot's joint Rt foot  THERAPY DIAG:  Unsteadiness on feet  Muscle weakness (generalized)  Other abnormalities of gait and mobility  Rationale for Evaluation and Treatment Rehabilitation  SUBJECTIVE:                                                                                                                                                                                              SUBJECTIVE STATEMENT: Pt reports he now has an ulcer on the bottom of his 2nd toe on his Rt foot; has order from MD for an orthotic build up for his shoe - is waiting to hear from [facility] for an appt; reports no pain in Rt foot  Pt accompanied by: self  PERTINENT HISTORY: h/o TBI 1976, Charcot's joint of Rt foot - s/p  Rt foot surgery 06-23-16 (foot fusion), chronic venous insufficiency with current ulcer on RLE and Rt medial malleolus, HTN, peripheral neuropathy, h/o thoracic aortic aneurysm, h/o neck fusion, s/p thoracic aortic endovascular  stent graft, primary OA Rt foot  PAIN:  Are you having pain? No  PATIENT GOALS: improve balance and leg strength  OBJECTIVE:     TREATMENT 05-18-22:  Neuro Re-ed:    Pt performed standing on AirEx pad - standing static with EO for 5 sec hold; standing with EO - pt performed 5 horizontal head turns and then 5 vertical head turns for improved balance with vestibular input incorporated - pt able to perform without UE support on // bars  Marching on Airex pad 10 reps each leg with UE support prn  Rockerboard 10 reps anterior/posteriorly; rocking forward stepping forward/back 5 reps each leg with UE support prn  Pt performed cone taps to 2 cones without UE support - straight ahead 5 reps and then diagonal taps 5 reps each foot with UE support on // bars prn with LOB    TherEx:  Step ups 10 reps RLE onto 6" step - with bil. UE support on rails  Bilateral hamstring/gastroc stretch (runner's stretch) with foot on 1st step - 30 sec hold x 1 rep each leg  Bil. Heel cord stretch with heels hanging off edge of step - 30 sec hold x 1 rep  Leg press - 70# bil. LE's 3 sets 10 reps with cues for controlled eccentric return    HOME EXERCISE PROGRAM:   No changes made to HEP on 04-20-22   GOALS: Goals reviewed with patient? Yes  TARGET DATE 04-30-22   PT Short Term Goals - 04/06/22 1951       PT SHORT TERM GOAL #1   Title Independent in HEP for bil. LE strengthening and balance exercises.    Time 3    Period Weeks    Status Goal met - 05-03-22   Target Date 04/30/22      PT SHORT TERM GOAL #2   Title Improve TUG score from 15.28 secs with no device to </= 13.0 secs for reduced fall risk.    Time 3    Period Weeks    Status Partially met - 13.34 secs    Target Date 04/30/22      PT SHORT TERM GOAL #3   Title Pt will improve 5x sit to stand score to </= 17 secs with bil. UE support from standard chair to demo improved LE strength.    Baseline 20.81 secs;  19.28 secs with UE  support   Time 3    Period Weeks    Status Not met   Target Date 04/30/22               LONG TERM GOALS: Target date: 05/21/2022    PT Long Term Goals - 04/06/22 1955       PT LONG TERM GOAL #1   Title Increase TUG score from 15.28 secs to </= 12.0 secs without device for reduced fall risk.    Baseline 15.28 secs    Time 6    Period Weeks    Status New    Target Date 05/21/22      PT LONG TERM GOAL #2   Title Increase gait velocity from 2.71 ft/sec to >/= 3.0 ft/sec without device for increased gait effiiciency.    Baseline 12.09 = 2.71 ft/sec without device    Time 6    Period  Weeks    Status New    Target Date 05/21/22      PT LONG TERM GOAL #3   Title Improve balance so pt able to perform SLS on each leg for at least 4 secs on LLE and at least 2 secs on RLE to increase safety with stepping over objects.    Baseline 3.22 secs on LLE:  unable to perform SLS on RLE    Time 6    Period Weeks    Status New    Target Date 05/21/22      PT LONG TERM GOAL #4   Title Improve LE strength so pt able to perform sit to stand from standard chair with 1 UE support.    Baseline bil. UE support needed    Time 6    Period Weeks    Status New    Target Date 05/21/22             ASSESSMENT:  CLINICAL IMPRESSION: PT session focused on LE strengthening and balance exercises on compliant/moving surfaces to improve dynamic balance.  Pt continues to be limited by c/o Lt knee pain with LLE weight bearing and with step negotiation; also limited by orthopedic issues in RLE.  Plan D/C next session.  Pt now reports development of new ulcer on bottom of 2nd toe on his Rt foot - states he has a prescription from MD for orthotic for Rt shoe to lift toes up off of bottom of shoe.  Cont with POC.    OBJECTIVE IMPAIRMENTS decreased activity tolerance, decreased balance, difficulty walking, decreased ROM, decreased strength, and impaired flexibility.   ACTIVITY LIMITATIONS carrying,  bending, squatting, stairs, transfers, and locomotion level  PARTICIPATION LIMITATIONS: meal prep, cleaning, laundry, shopping, community activity, and yard work  PERSONAL FACTORS Behavior pattern, Time since onset of injury/illness/exacerbation, and 1 comorbidity: Rt Charcot foot with limited ROM due to ankle fusion  are also affecting patient's functional outcome.   REHAB POTENTIAL: Fair due to chronicity of musculoskeletal deficits which are impacting balance  CLINICAL DECISION MAKING: Stable/uncomplicated  EVALUATION COMPLEXITY: Low  PLAN: PT FREQUENCY: 1x/week  PT DURATION: 6 weeks  PLANNED INTERVENTIONS: Therapeutic exercises, Therapeutic activity, Neuromuscular re-education, Balance training, Gait training, Patient/Family education, Self Care, Stair training, and Orthotic/Fit training  PLAN FOR NEXT SESSION:    Continue balance and LE strengthening exercises   Sharleen Szczesny, [place], PT 05/19/2022, 12:42 PM

## 2022-05-19 ENCOUNTER — Encounter: Payer: Self-pay | Admitting: Physical Therapy

## 2022-05-24 ENCOUNTER — Ambulatory Visit: Payer: Medicare Other | Admitting: Physical Therapy

## 2022-05-24 ENCOUNTER — Encounter: Payer: Self-pay | Admitting: Physical Therapy

## 2022-05-24 DIAGNOSIS — R2689 Other abnormalities of gait and mobility: Secondary | ICD-10-CM

## 2022-05-24 DIAGNOSIS — M6281 Muscle weakness (generalized): Secondary | ICD-10-CM

## 2022-05-24 DIAGNOSIS — R2681 Unsteadiness on feet: Secondary | ICD-10-CM

## 2022-05-24 NOTE — Therapy (Signed)
OUTPATIENT PHYSICAL THERAPY NEURO TREATMENT NOTE/DISCHARGE SUMMARY   Patient Name: Trevor [name] MRN: [id number] DOB:1950/12/05, 71 y.o., male Today's Date: 05/24/2022   PCP: Velna Hatchet, MD  REFERRING PROVIDER: Velna Hatchet, MD    PT End of Session - 05/24/22 2001     Visit Number 7    Number of Visits 7    Date for PT Re-Evaluation 05/21/22    Authorization Type Medicare    Authorization Time Period 04-05-22 - 06-12-22    PT Start Time 1101    PT Stop Time 1145    PT Time Calculation (min) 44 min    Activity Tolerance Patient tolerated treatment well    Behavior During Therapy [facility] for tasks assessed/performed                   Past Medical History:  Diagnosis Date   Acute pain of left shoulder 04/04/2017   ADD (attention deficit disorder)    Allergy    Anxiety    Arthritis    right foot   Arthritis of midfoot 04/29/2016   Overview:  Added automatically from request for surgery [id number]   Blood transfusion without reported diagnosis    Cataract    removed both eyes with lens implants    Chest wall pain 09/29/2017   Chronic pain of right ankle 04/08/2016   Cough due to ACE inhibitor 03/18/2017   Depression    Descending thoracic aortic aneurysm ([place]) 04/04/2017   Overview:  4.5 cm on CT scan of April 04, 2017.   Diverticulosis of colon 11/25/2015   Dyspnea on exertion 04/04/2017   Essential hypertension 11/25/2015   Executive function deficit 12/31/2015   Family history of colon cancer in mother    dx'd age late 70's   Foot pain, right 04/08/2016   Gait abnormality 03/29/2019   H/O blood clots    Health maintenance examination 09/29/2017   History of colonic polyps 02/04/2011   3 10 mm hyperplastic polyps removed from right and left colon 2004 4 polyps, one 10 mm others diminutive and hyperplastic 2007 Followed closer than routine risk due to multiple large hyperplastic polyps 5 adenomas with one of them serrated  05/29/2015 11 polyps max 10 mm adenomas, ssa's,  hyperplastic - 09/21/2017 3 diminutive polyps removed adenomas - recall 2021 (2 yrs)      Hypercholesterolemia 11/25/2015   Hyperlipidemia    Hypertension    Left foot pain 01/27/2016   Nocturia more than twice per night 12/31/2015   Pain in joints 12/31/2015   Pectoralis major tendinitis, right 03/18/2017   Pericarditis    Peripheral neuropathy 04/24/2018   Pneumonia    Primary osteoarthritis of right foot 11/14/2015   Shortness of breath 09/29/2017   Status post right foot surgery 06/23/2016   Strain of right pectoralis muscle 09/07/2016   Thoracic aortic aneurysm ([place]) 06/08/2017   Traumatic brain injury ([place])    1976   Ulcer    Varicose veins    Varicose veins of lower extremities with ulcer ([place]) 11/16/2012   Past Surgical History:  Procedure Laterality Date   BURR HOLE OF CRANIUM  04/10/75   COLONOSCOPY     COLONOSCOPY W/ POLYPECTOMY  2004, 2007, 02/03/11   multiple large hyperplastic polyps 04/07, adenomas and one serrated 2012   ENDOVENOUS ABLATION SAPHENOUS VEIN W/ LASER Left 09-02-2010   EVLA left great and small saphenous vein by Curt Jews MD    FOOT FUSION Right    [place]  neck fusion     POLYPECTOMY     ROTATOR CUFF REPAIR Right 2019   rt. knee replacement     THORACIC AORTIC ENDOVASCULAR STENT GRAFT N/A 06/08/2017   Procedure: THORACIC AORTIC ENDOVASCULAR STENT GRAFT;  Surgeon: Rosetta Posner, MD;  Location: [place];  Service: Vascular;  Laterality: N/A;   Patient Active Problem List   Diagnosis Date Noted   Gait abnormality 03/29/2019   Peripheral neuropathy 04/24/2018   S/P rotator cuff repair 01/31/2018   Nontraumatic tear of right supraspinatus tendon 01/19/2018   Brachial neuritis or radiculitis 11/24/2017   Chest wall pain 09/29/2017   Shortness of breath 09/29/2017   Health maintenance examination 09/29/2017   Thoracic aortic aneurysm ([place]) 06/08/2017   Acute pain of left shoulder 04/04/2017   Descending thoracic aortic aneurysm ([place]) 04/04/2017    Dyspnea on exertion 04/04/2017   Cough due to ACE inhibitor 03/18/2017   Pectoralis major tendinitis, right 03/18/2017   Strain of right pectoralis muscle 09/07/2016   Status post right foot surgery 06/23/2016   Arthritis of midfoot 04/29/2016   Chronic pain of right ankle 04/08/2016   Foot pain, right 04/08/2016   Left foot pain 01/27/2016   Executive function deficit 12/31/2015   Nocturia more than twice per night 12/31/2015   Pain in joints 12/31/2015   ADD (attention deficit disorder) 11/25/2015   Depression 11/25/2015   Diverticulosis of colon 11/25/2015   Essential hypertension 11/25/2015   Hypercholesterolemia 11/25/2015   Primary osteoarthritis of right foot 11/14/2015   Family history of rectal cancer in elderly mother 05/29/2015   Varicose veins of lower extremities with ulcer ([place]) 11/16/2012   History of colonic polyps 02/04/2011    ONSET DATE: 2017 for Rt foot surgery:  Referral date 03-23-22  REFERRING DIAG: Balance disorder:  Charcot's joint Rt foot  THERAPY DIAG:  Unsteadiness on feet  Muscle weakness (generalized)  Other abnormalities of gait and mobility  Rationale for Evaluation and Treatment Rehabilitation  SUBJECTIVE:                                                                                                                                                                                              SUBJECTIVE STATEMENT: Pt reports he still hasn't been to [facility]  Pt accompanied by: self  PERTINENT HISTORY: h/o TBI 1976, Charcot's joint of Rt foot - s/p Rt foot surgery 06-23-16 (foot fusion), chronic venous insufficiency with current ulcer on RLE and Rt medial malleolus, HTN, peripheral neuropathy, h/o thoracic aortic aneurysm, h/o neck fusion, s/p thoracic aortic endovascular stent graft, primary OA Rt foot  PAIN:  Are you  having pain? No  PATIENT GOALS: improve balance and leg strength  OBJECTIVE:     TREATMENT 05-24-22:  Gait velocity:   10.35 secs = 3.17 ft/sec without assistive device  Pt amb. Without assistive device modified independently   Neuro Re-ed:    Pt performed standing on AirEx pad - standing static with EO for 5 sec hold; standing with EO - pt performed 5 horizontal head turns and then 5 vertical head turns for improved balance with vestibular input incorporated - pt able to perform without UE support on // bars  Marching on Airex pad 10 reps each leg with UE support prn  Rockerboard 10 reps anterior/posteriorly; rocking forward stepping forward/back 5 reps each leg with UE support prn  RLE SLS: 2.37 secs:  LLE SLS:  2.31 secs  TUG score 13.46 secs  TherEx:   Pt performed sit to stand transfer from mat table with LUE support  Bilateral hamstring/gastroc stretch (runner's stretch) with foot on 1st step - 30 sec hold x 1 rep each leg  Bil. Heel cord stretch with heels hanging off edge of step - 30 sec hold x 1 rep  Leg press - 70# bil. LE's 3 sets 10 reps with cues for controlled eccentric return    HOME EXERCISE PROGRAM:   No changes made to HEP on 04-20-22   GOALS: Goals reviewed with patient? Yes  TARGET DATE 04-30-22   PT Short Term Goals - 04/06/22 1951       PT SHORT TERM GOAL #1   Title Independent in HEP for bil. LE strengthening and balance exercises.    Time 3    Period Weeks    Status Goal met - 05-03-22   Target Date 04/30/22      PT SHORT TERM GOAL #2   Title Improve TUG score from 15.28 secs with no device to </= 13.0 secs for reduced fall risk.    Time 3    Period Weeks    Status Partially met - 13.34 secs    Target Date 04/30/22      PT SHORT TERM GOAL #3   Title Pt will improve 5x sit to stand score to </= 17 secs with bil. UE support from standard chair to demo improved LE strength.    Baseline 20.81 secs;  19.28 secs with UE support   Time 3    Period Weeks    Status Not met   Target Date 04/30/22               LONG TERM GOALS: Target date: 05/21/2022     PT Long Term Goals - 04/06/22 1955       PT LONG TERM GOAL #1   Title Increase TUG score from 15.28 secs to </= 12.0 secs without device for reduced fall risk.    Baseline 15.28 secs;   05-24-22: 13.46 secs    Time 6    Period Weeks    Status Partially met 05-24-22   Target Date 05/21/22      PT LONG TERM GOAL #2   Title Increase gait velocity from 2.71 ft/sec to >/= 3.0 ft/sec without device for increased gait effiiciency.    Baseline 12.09 = 2.71 ft/sec without device: 12.38, 10.35 = 3.17 ft/sec   Time 6    Period Weeks    Status Goal met 05-24-22   Target Date 05/21/22      PT LONG TERM GOAL #3   Title Improve balance so pt able to perform  SLS on each leg for at least 4 secs on LLE and at least 2 secs on RLE to increase safety with stepping over objects.    Baseline 3.22 secs on LLE:  unable to perform SLS on RLE:  RLE 2.37 secs:  LLE 2.31 secs   Time 6    Period Weeks    Status Partially met 05-24-22   Target Date 05/21/22      PT LONG TERM GOAL #4   Title Improve LE strength so pt able to perform sit to stand from standard chair with 1 UE support.    Baseline bil. UE support needed;  LUE support   Time 6    Period Weeks    Status Goal met 05-24-22   Target Date 05/21/22             ASSESSMENT:  CLINICAL IMPRESSION: Pt has met LTG's  #2 and 4 and partially met LTG's #1 & 3;  pt's TUG score has improved by 2 secs but not to stated LTG;  pt's SLS on RLE has improved to 2.37 secs with pt unable to perform this activity at initial eval. SLS on LLE = 2.31 secs in today's session, slightly decreased from 3.22 secs at initial eval.  Pt has maximized functional progress at this time. D/C from PT due to completion of program and no further needs identified.     OBJECTIVE IMPAIRMENTS decreased activity tolerance, decreased balance, difficulty walking, decreased ROM, decreased strength, and impaired flexibility.   ACTIVITY LIMITATIONS carrying, bending, squatting, stairs,  transfers, and locomotion level  PARTICIPATION LIMITATIONS: meal prep, cleaning, laundry, shopping, community activity, and yard work  PERSONAL FACTORS Behavior pattern, Time since onset of injury/illness/exacerbation, and 1 comorbidity: Rt Charcot foot with limited ROM due to ankle fusion  are also affecting patient's functional outcome.   REHAB POTENTIAL: Fair due to chronicity of musculoskeletal deficits which are impacting balance  CLINICAL DECISION MAKING: Stable/uncomplicated  EVALUATION COMPLEXITY: Low  PLAN: PT FREQUENCY: 1x/week  PT DURATION: 6 weeks  PLANNED INTERVENTIONS: Therapeutic exercises, Therapeutic activity, Neuromuscular re-education, Balance training, Gait training, Patient/Family education, Self Care, Stair training, and Orthotic/Fit training  PLAN FOR NEXT SESSION:    D/C on 05-24-22    PHYSICAL THERAPY DISCHARGE SUMMARY  Visits from Start of Care: 7  Current functional level related to goals / functional outcomes: See above for progress towards goals   Remaining deficits: Continued decreased high level balance skills   Education / Equipment: Pt has been instructed in HEP for LE strengthening and balance exercises   Patient agrees to discharge. Patient goals were partially met. Patient is being discharged due to maximized rehab potential. and completion of authorized visits in this certification period.    Alda Lea, PT 05/24/2022, 8:04 PM

## 2022-06-10 ENCOUNTER — Other Ambulatory Visit: Payer: Self-pay | Admitting: Genetic Counselor

## 2022-06-10 ENCOUNTER — Other Ambulatory Visit: Payer: Self-pay

## 2022-06-10 ENCOUNTER — Inpatient Hospital Stay: Payer: Medicare Other | Attending: Genetic Counselor | Admitting: Genetic Counselor

## 2022-06-10 ENCOUNTER — Inpatient Hospital Stay: Payer: Medicare Other

## 2022-06-10 DIAGNOSIS — Z803 Family history of malignant neoplasm of breast: Secondary | ICD-10-CM

## 2022-06-10 DIAGNOSIS — Z8052 Family history of malignant neoplasm of bladder: Secondary | ICD-10-CM

## 2022-06-10 DIAGNOSIS — Z86010 Personal history of colonic polyps: Secondary | ICD-10-CM | POA: Diagnosis not present

## 2022-06-10 DIAGNOSIS — Z8051 Family history of malignant neoplasm of kidney: Secondary | ICD-10-CM

## 2022-06-10 DIAGNOSIS — Z800 Family history of malignant neoplasm of digestive organs: Secondary | ICD-10-CM | POA: Diagnosis not present

## 2022-06-11 LAB — GENETIC SCREENING ORDER

## 2022-06-13 ENCOUNTER — Encounter: Payer: Self-pay | Admitting: Genetic Counselor

## 2022-06-14 ENCOUNTER — Encounter: Payer: Self-pay | Admitting: Genetic Counselor

## 2022-06-14 DIAGNOSIS — Z8052 Family history of malignant neoplasm of bladder: Secondary | ICD-10-CM | POA: Insufficient documentation

## 2022-06-14 DIAGNOSIS — Z8051 Family history of malignant neoplasm of kidney: Secondary | ICD-10-CM

## 2022-06-14 DIAGNOSIS — Z800 Family history of malignant neoplasm of digestive organs: Secondary | ICD-10-CM | POA: Insufficient documentation

## 2022-06-14 DIAGNOSIS — Z803 Family history of malignant neoplasm of breast: Secondary | ICD-10-CM

## 2022-06-14 HISTORY — DX: Family history of malignant neoplasm of bladder: Z80.52

## 2022-06-14 HISTORY — DX: Family history of malignant neoplasm of digestive organs: Z80.0

## 2022-06-14 HISTORY — DX: Family history of malignant neoplasm of kidney: Z80.51

## 2022-06-14 HISTORY — DX: Family history of malignant neoplasm of breast: Z80.3

## 2022-06-14 NOTE — Progress Notes (Signed)
REFERRING PROVIDER: Gatha Mayer, MD [address],  [place] [address]  PRIMARY PROVIDER:  Velna Hatchet, MD  PRIMARY REASON FOR VISIT:  1. History of colonic polyps   2. Family history of rectal cancer   3. Family history of bladder cancer   4. Family history of breast cancer   5. Family history of kidney cancer     HISTORY OF PRESENT ILLNESS:   Trevor [name], a 71 y.o. male, was seen for a [place] cancer genetics consultation at the request of Dr. Carlean Purl due to a personal history of colon polyps and a family history of GI cancers.  Trevor [name] presents to clinic today to discuss the possibility of a hereditary predisposition to cancer, to discuss genetic testing, and to further clarify his future cancer risks, as well as potential cancer risks for family members.   Trevor [name] is a 71 y.o. male with no personal history of cancer.  Since 2004, he has had over 20 adenomas detected by colonoscopy.  His most recent colonoscopy was in 2023, which detected eight sessile serrated polyps and tubular adenomas, two hyperplastic polyps, and one lymphoid polyp.  Recall colonoscopy planned for 2025.    Past Medical History:  Diagnosis Date   Acute pain of left shoulder 04/04/2017   ADD (attention deficit disorder)    Allergy    Anxiety    Arthritis    right foot   Arthritis of midfoot 04/29/2016   Overview:  Added automatically from request for surgery [id number]   Blood transfusion without reported diagnosis    Cataract    removed both eyes with lens implants    Chest wall pain 09/29/2017   Chronic pain of right ankle 04/08/2016   Cough due to ACE inhibitor 03/18/2017   Depression    Descending thoracic aortic aneurysm ([place]) 04/04/2017   Overview:  4.5 cm on CT scan of April 04, 2017.   Diverticulosis of colon 11/25/2015   Dyspnea on exertion 04/04/2017   Essential hypertension 11/25/2015   Executive function deficit 12/31/2015   Family history of bladder cancer 06/14/2022   Family  history of breast cancer 06/14/2022   Family history of colon cancer in mother    dx'd age late 65's   Family history of kidney cancer 06/14/2022   Family history of rectal cancer 06/14/2022   Foot pain, right 04/08/2016   Gait abnormality 03/29/2019   H/O blood clots    Health maintenance examination 09/29/2017   History of colonic polyps 02/04/2011   3 10 mm hyperplastic polyps removed from right and left colon 2004 4 polyps, one 10 mm others diminutive and hyperplastic 2007 Followed closer than routine risk due to multiple large hyperplastic polyps 5 adenomas with one of them serrated  05/29/2015 11 polyps max 10 mm adenomas, ssa's, hyperplastic - 09/21/2017 3 diminutive polyps removed adenomas - recall 2021 (2 yrs)      Hypercholesterolemia 11/25/2015   Hyperlipidemia    Hypertension    Left foot pain 01/27/2016   Nocturia more than twice per night 12/31/2015   Pain in joints 12/31/2015   Pectoralis major tendinitis, right 03/18/2017   Pericarditis    Peripheral neuropathy 04/24/2018   Pneumonia    Primary osteoarthritis of right foot 11/14/2015   Shortness of breath 09/29/2017   Status post right foot surgery 06/23/2016   Strain of right pectoralis muscle 09/07/2016   Thoracic aortic aneurysm ([place]) 06/08/2017   Traumatic brain injury ([place])    1976  Ulcer    Varicose veins    Varicose veins of lower extremities with ulcer ([place]) 11/16/2012    Past Surgical History:  Procedure Laterality Date   BURR HOLE OF CRANIUM  04/10/75   COLONOSCOPY     COLONOSCOPY W/ POLYPECTOMY  2004, 2007, 02/03/11   multiple large hyperplastic polyps 04/07, adenomas and one serrated 2012   ENDOVENOUS ABLATION SAPHENOUS VEIN W/ LASER Left 09-02-2010   EVLA left great and small saphenous vein by Curt Jews MD    FOOT FUSION Right    [facility]   neck fusion     POLYPECTOMY     ROTATOR CUFF REPAIR Right 2019   rt. knee replacement     THORACIC AORTIC ENDOVASCULAR STENT GRAFT N/A 06/08/2017   Procedure:  THORACIC AORTIC ENDOVASCULAR STENT GRAFT;  Surgeon: Rosetta Posner, MD;  Location: [facility];  Service: Vascular;  Laterality: N/A;      FAMILY HISTORY:  We obtained a detailed, 4-generation family history.  Significant diagnoses are listed below: Family History  Problem Relation Age of Onset   Rectal cancer Mother        dx early 80s; d. 92   Colon polyps Father    Bladder Cancer Father        dx late 59s-early 60s   Breast cancer Sister        metastatic; dx 10s   Kidney cancer Sister 76   Brain cancer Cousin        d. > 26; maternal male cousin   Cancer Cousin        unknown type; d. late 7s; paternal male cousin   Cancer Maternal Grandmother        unknown type; GI? d. 34s     Trevor [name] is unaware of previous family history of genetic testing for hereditary cancer risks. There is no reported Ashkenazi Jewish ancestry. There is no known consanguinity.  GENETIC COUNSELING ASSESSMENT: Trevor [name] is a 71 y.o. male with a personal history of colon polyps which is somewhat suggestive of a hereditary polyposis syndrome, given his history of more than 20 adenomas. We, therefore, discussed and recommended the following at today's visit.   DISCUSSION: We discussed that polyps in general are common, however, most people have fewer than 5 lifetime polyps.  When an individual has 10 or more polyps we become concerned about an underlying polyposis syndrome.  The most common hereditary polyposis syndromes are Familial Adenomatous Polyposis (FAP), caused by mutations in the APC gene, and MUTYH-Associated Polyposis (MAP), caused by mutations in the MUTYH gene.  There are other genes that are associated with polyposis.  We discussed that testing is beneficial for several reasons, including knowing about cancer risks, identifying potential screening and risk-reduction options that may be appropriate, and to understand if other family members could be at risk for colon polyps and/or cancer and allow them  to undergo genetic testing.   We reviewed the characteristics, features and inheritance patterns of hereditary cancer syndromes. We also discussed genetic testing, including the appropriate family members to test, the process of testing, insurance coverage and turn-around-time for results. We discussed the implications of a negative, positive and/or variant of uncertain significant result. We recommended Trevor [name] pursue genetic testing for the [facility] CancerNext-Expanded +RNA panel.  The CancerNext-Expanded gene panel offered by [facility] and includes sequencing, rearrangement, and RNA analysis for the following 77 genes: AIP, ALK, APC, ATM, AXIN2, BAP1, BARD1, BLM, BMPR1A, BRCA1, BRCA2, BRIP1, CDC73, CDH1, CDK4,  CDKN1B, CDKN2A, CHEK2, CTNNA1, DICER1, FANCC, FH, FLCN, GALNT12, KIF1B, LZTR1, MAX, MEN1, MET, MLH1, MSH2, MSH3, MSH6, MUTYH, NBN, NF1, NF2, NTHL1, PALB2, PHOX2B, PMS2, POT1, PRKAR1A, PTCH1, PTEN, RAD51C, RAD51D, RB1, RECQL, RET, SDHA, SDHAF2, SDHB, SDHC, SDHD, SMAD4, SMARCA4, SMARCB1, SMARCE1, STK11, SUFU, TMEM127, TP53, TSC1, TSC2, VHL and XRCC2 (sequencing and deletion/duplication); EGFR, EGLN1, HOXB13, KIT, MITF, PDGFRA, POLD1, and POLE (sequencing only); EPCAM and GREM1 (deletion/duplication only).   Based on Trevor [name] personal history of colon polyps, he meets medical criteria for genetic testing. Despite that he meets criteria, he may still have an out of pocket cost. We discussed that if his out of pocket cost for testing is over $100, the laboratory should contact them to discuss self-pay options and/or patient pay assistance programs.   We discussed that some people do not want to undergo genetic testing due to fear of genetic discrimination.  A federal law called the Genetic Information Non-Discrimination Act (GINA) of 2008 helps protect individuals against genetic discrimination based on their genetic test results.  It impacts both health insurance and employment.  With health  insurance, it protects against increased premiums, being kicked off insurance or being forced to take a test in order to be insured.  For employment it protects against hiring, firing and promoting decisions based on genetic test results.  GINA does not apply to those in the [employer], those who work for companies with less than 15 employees, and new life insurance or long-term disability insurance policies.  Health status due to a cancer diagnosis is not protected under GINA.  PLAN: After considering the risks, benefits, and limitations, Trevor [name] provided informed consent to pursue genetic testing and the blood sample was sent to [facility] for analysis of the CancerNext-Expanded +RNAinsight Panel. Results should be available within approximately 3 weeks' time, at which point they will be disclosed by telephone to Trevor [name], as will any additional recommendations warranted by these results. Trevor [name] will receive a summary of his genetic counseling visit and a copy of his results once available. This information will also be available in Epic.   Trevor [name] questions were answered to his satisfaction today. Our contact information was provided should additional questions or concerns arise. Thank you for the referral and allowing [place] to share in the care of your patient.   Philippa Vessey M. Joette Catching, [place], [facility] Genetic Counselor [email or web address] (P) [phone]   The patient was seen for a total of 40 minutes in face-to-face genetic counseling.  The was patient was accompanied by his wife, Shirlean Mylar.  Drs. Lindi Adie and/or Burr Medico were available to discuss this case as needed.  _______________________________________________________________________ For Office Staff:  Number of people involved in session: 2 Was an Intern/ student involved with case: no

## 2022-07-01 ENCOUNTER — Ambulatory Visit: Payer: Self-pay | Admitting: Genetic Counselor

## 2022-07-01 ENCOUNTER — Encounter: Payer: Self-pay | Admitting: Genetic Counselor

## 2022-07-01 ENCOUNTER — Telehealth: Payer: Self-pay | Admitting: Genetic Counselor

## 2022-07-01 DIAGNOSIS — Z803 Family history of malignant neoplasm of breast: Secondary | ICD-10-CM

## 2022-07-01 DIAGNOSIS — Z8051 Family history of malignant neoplasm of kidney: Secondary | ICD-10-CM

## 2022-07-01 DIAGNOSIS — Z1379 Encounter for other screening for genetic and chromosomal anomalies: Secondary | ICD-10-CM

## 2022-07-01 DIAGNOSIS — Z800 Family history of malignant neoplasm of digestive organs: Secondary | ICD-10-CM

## 2022-07-01 DIAGNOSIS — Z1501 Genetic susceptibility to malignant neoplasm of breast: Secondary | ICD-10-CM

## 2022-07-01 DIAGNOSIS — Z86010 Personal history of colonic polyps: Secondary | ICD-10-CM

## 2022-07-01 DIAGNOSIS — Z8052 Family history of malignant neoplasm of bladder: Secondary | ICD-10-CM

## 2022-07-01 HISTORY — DX: Genetic susceptibility to malignant neoplasm of breast: Z15.01

## 2022-07-01 NOTE — Telephone Encounter (Signed)
Revealed CHEK2 mutation.  Discussed cancer screening and family management.

## 2022-07-01 NOTE — Telephone Encounter (Signed)
Contacted patient in attempt to disclose results of genetic testing.  Robin's VM. LVM with contact information requesting a call back.

## 2022-07-02 DIAGNOSIS — Z23 Encounter for immunization: Secondary | ICD-10-CM | POA: Diagnosis not present

## 2022-07-02 DIAGNOSIS — M14679 Charcot's joint, unspecified ankle and foot: Secondary | ICD-10-CM | POA: Diagnosis not present

## 2022-07-02 DIAGNOSIS — Z79899 Other long term (current) drug therapy: Secondary | ICD-10-CM | POA: Diagnosis not present

## 2022-07-02 DIAGNOSIS — G894 Chronic pain syndrome: Secondary | ICD-10-CM | POA: Diagnosis not present

## 2022-07-02 DIAGNOSIS — I10 Essential (primary) hypertension: Secondary | ICD-10-CM | POA: Diagnosis not present

## 2022-07-02 DIAGNOSIS — F330 Major depressive disorder, recurrent, mild: Secondary | ICD-10-CM | POA: Diagnosis not present

## 2022-07-06 ENCOUNTER — Ambulatory Visit: Payer: Self-pay | Admitting: Genetic Counselor

## 2022-07-06 ENCOUNTER — Encounter: Payer: Self-pay | Admitting: Genetic Counselor

## 2022-07-06 NOTE — Progress Notes (Signed)
GENETIC TEST RESULTS  Patient Name: Trevor [name] Patient Age: 71 y.o. Encounter Date: 07/01/2022  Referring Provider: Silvano Rusk, MD  Trevor [name] was seen in the [place] clinic on June 10, 2022 due to a personal history of colon polyps and concern regarding a hereditary predisposition to cancer in the family. Please refer to the prior Genetics clinic note for more information regarding Trevor [name] medical and family histories and our assessment at the time.   Trevor [name] is a 71 y.o. male with no personal history of cancer.  Since 2004, he has had over 20 adenomas detected by colonoscopy.  His most recent colonoscopy was in 2023, which detected eight sessile serrated polyps and tubular adenomas, two hyperplastic polyps, and one lymphoid polyp.  Recall colonoscopy planned for 2025.    Family History:  We obtained a detailed, 4-generation family history.  Significant diagnoses are listed below: Family History  Problem Relation Age of Onset   Rectal cancer Mother        dx early 47s; d. 59   Colon polyps Father    Bladder Cancer Father        dx late 42s-early 60s   Breast cancer Sister        metastatic; dx 38s   Kidney cancer Sister 41   Brain cancer Cousin        d. > 51; maternal male cousin   Cancer Cousin        unknown type; d. late 11s; paternal male cousin   Cancer Maternal Grandmother        unknown type; GI? d. 58s      Trevor [name] is unaware of previous family history of genetic testing for hereditary cancer risks. There is no reported Ashkenazi Jewish ancestry. There is no known consanguinity.    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 Specifically, this variant is  c.1100delC (X.[id number]).  No other pathogenic variants were detected in the [facility] Panel.    The test report has been scanned into EPIC and is located under the Molecular Pathology section of the  Results Review tab.  A portion of the result report is included below for reference. Genetic testing reported out on June 30, 2022.      Trevor [name] genetic test results do not explain why he has had multiple colon polyps. We discussed with Trevor [name] that because current genetic testing is not perfect, it is possible there may be a gene mutation in one of these genes that current testing cannot detect, but that chance is small. We also discussed that there could be another gene that has not yet been discovered, or that we have not yet tested, that is responsible for the colon polyps.. Therefore, it is important to remain in touch with cancer genetics in the future so that we can continue to offer Trevor [name] the most up to date genetic testing.  His relatives should also notify their providers about the family history of colon polyps.   Cancer Risks for CHEK2: Females have a 20-40% lifetime risk of breast cancer. Men are thought to be at an increased risk of prostate cancer. The exact risk figure is unknown at this time. 8-9% lifetime risk of colorectal cancer Data suggests a possible increased risk for ovarian, endometrial, thyroid, and other types of cancer    Research is continuing to help learn more about the cancers associated with CHEK2 pathogenic variants  and what the exact risks are to develop these cancers.  Management Recommendations:  Male Breast Cancer Screening/Risk Reduction: Breast cancer screening includes: Breast awareness beginning at age 45 Monthly self-breast examination beginning at age 58 Clinical breast examination every 6-12 months beginning at age 93 or at the age of the earliest diagnosed breast cancer in the family, if onset was before age 50 Annual mammogram with consideration of tomosynthesis starting at age 60 or 74 years prior to the youngest age of diagnosis, whichever comes first Consider breast MRI with contrast starting at age 48-35 Evidence is  insufficient for a prophylactic risk-reducing mastectomy, manage based on family history   Colon Cancer Screening: Colonoscopy screening every 5 years beginning at age 30 If an individual has a first-degree relative with colorectal cancer, screening should begin 10 years prior to the relative's age at diagnosis if before 61. If an individual has a personal history of colorectal cancer, screening recommendations should be based on recommendations for post-colorectal cancer resection. Studies have demonstrated that the use of daily aspirin decreases the colorectal cancer risk in patients with an increased risk for colorectal cancer. Ongoing studies are investigating the optimal dose and duration of use of aspirin for colon cancer prevention. The decision to use aspirin should be made on an individualized basis including a discussion of dose, benefits, and adverse effects.   Prostate Cancer Screening: Consider beginning annual PSA blood test and digital rectal exams at age 77.    This information is based on current understanding of the gene and may change in the future.   Implications for Family Members: Hereditary predisposition to cancer due to pathogenic variants in the CHEK2 gene has autosomal dominant inheritance. This means that first degree relatives (parents, full siblings, and children) have a 50% chance of having the same pathogenic variant.  Other relatives also have an increased chance of the pathogenic variant. Identification of a pathogenic variant allows for the recognition of at-risk relatives who can pursue testing for the familial variant.   Family members are encouraged to consider genetic testing for this familial pathogenic variant. As there are generally no childhood cancer risks associated with pathogenic variants in the CHEK2 gene, individuals in the family are not recommended to have testing until they reach at least 69 years of age. Complimentary testing for the familial  variant is available for 90 days. They may contact our office at [phone] for more information or to schedule an appointment. Family members who live outside of the area are encouraged to find a genetic counselor in their area by visiting: [email or web address].   Resources: FORCE (Facing Our Risk of Cancer Empowered) is a resource for those with a hereditary predisposition to develop cancer.  FORCE provides information about risk reduction, advocacy, legislation, and clinical trials.  Additionally, FORCE provides a platform for collaboration and support; which includes: peer navigation, message boards, local support groups, a toll-free helpline, research registry and recruitment, advocate training, published medical research, webinars, brochures, mastectomy photos, and more.  For more information, visit www.facingourrisk.org  Plan:  Mr. Gautier stated that he already receives annual PSA screening.  His most recent colonoscopy was in 2023 with follow-up colonoscopy planned for 2025 given his personal history of colon polyps.  Dr. Carlean Purl and his PCP were notified.  Family letter provided to encourage genetic testing for relatives.   Our contact number was provided. Mr. Solis questions were answered to his satisfaction, and he knows he is welcome to call us at anytime with additional questions  or concerns.   Latamara Melder M. Joette Catching, [place], [facility] Certified Film/video [email or web address] (P) [phone]

## 2022-07-10 DIAGNOSIS — Z23 Encounter for immunization: Secondary | ICD-10-CM | POA: Diagnosis not present

## 2022-09-03 DIAGNOSIS — Z125 Encounter for screening for malignant neoplasm of prostate: Secondary | ICD-10-CM | POA: Diagnosis not present

## 2022-09-03 DIAGNOSIS — I10 Essential (primary) hypertension: Secondary | ICD-10-CM | POA: Diagnosis not present

## 2022-09-03 DIAGNOSIS — E785 Hyperlipidemia, unspecified: Secondary | ICD-10-CM | POA: Diagnosis not present

## 2022-09-03 DIAGNOSIS — R7989 Other specified abnormal findings of blood chemistry: Secondary | ICD-10-CM | POA: Diagnosis not present

## 2022-09-10 DIAGNOSIS — G894 Chronic pain syndrome: Secondary | ICD-10-CM | POA: Diagnosis not present

## 2022-09-10 DIAGNOSIS — I10 Essential (primary) hypertension: Secondary | ICD-10-CM | POA: Diagnosis not present

## 2022-09-10 DIAGNOSIS — I712 Thoracic aortic aneurysm, without rupture, unspecified: Secondary | ICD-10-CM | POA: Diagnosis not present

## 2022-09-10 DIAGNOSIS — Z1339 Encounter for screening examination for other mental health and behavioral disorders: Secondary | ICD-10-CM | POA: Diagnosis not present

## 2022-09-10 DIAGNOSIS — K635 Polyp of colon: Secondary | ICD-10-CM | POA: Diagnosis not present

## 2022-09-10 DIAGNOSIS — Z0000 Encounter for general adult medical examination without abnormal findings: Secondary | ICD-10-CM | POA: Diagnosis not present

## 2022-09-10 DIAGNOSIS — M14679 Charcot's joint, unspecified ankle and foot: Secondary | ICD-10-CM | POA: Diagnosis not present

## 2022-09-10 DIAGNOSIS — I3139 Other pericardial effusion (noninflammatory): Secondary | ICD-10-CM | POA: Diagnosis not present

## 2022-09-10 DIAGNOSIS — L97519 Non-pressure chronic ulcer of other part of right foot with unspecified severity: Secondary | ICD-10-CM | POA: Diagnosis not present

## 2022-09-10 DIAGNOSIS — R7301 Impaired fasting glucose: Secondary | ICD-10-CM | POA: Diagnosis not present

## 2022-09-10 DIAGNOSIS — R600 Localized edema: Secondary | ICD-10-CM | POA: Diagnosis not present

## 2022-09-10 DIAGNOSIS — Z1331 Encounter for screening for depression: Secondary | ICD-10-CM | POA: Diagnosis not present

## 2022-10-12 DIAGNOSIS — M14671 Charcot's joint, right ankle and foot: Secondary | ICD-10-CM | POA: Diagnosis not present

## 2022-10-12 DIAGNOSIS — L97511 Non-pressure chronic ulcer of other part of right foot limited to breakdown of skin: Secondary | ICD-10-CM | POA: Diagnosis not present

## 2022-10-12 DIAGNOSIS — E11622 Type 2 diabetes mellitus with other skin ulcer: Secondary | ICD-10-CM | POA: Diagnosis not present

## 2022-10-12 DIAGNOSIS — M2041 Other hammer toe(s) (acquired), right foot: Secondary | ICD-10-CM | POA: Diagnosis not present

## 2022-10-12 DIAGNOSIS — M205X1 Other deformities of toe(s) (acquired), right foot: Secondary | ICD-10-CM | POA: Diagnosis not present

## 2022-10-12 DIAGNOSIS — M86171 Other acute osteomyelitis, right ankle and foot: Secondary | ICD-10-CM | POA: Diagnosis not present

## 2022-10-12 DIAGNOSIS — Z981 Arthrodesis status: Secondary | ICD-10-CM | POA: Diagnosis not present

## 2022-10-12 DIAGNOSIS — L97309 Non-pressure chronic ulcer of unspecified ankle with unspecified severity: Secondary | ICD-10-CM | POA: Diagnosis not present

## 2022-10-26 ENCOUNTER — Ambulatory Visit (INDEPENDENT_AMBULATORY_CARE_PROVIDER_SITE_OTHER): Payer: Medicare Other | Admitting: Podiatry

## 2022-10-26 ENCOUNTER — Ambulatory Visit (INDEPENDENT_AMBULATORY_CARE_PROVIDER_SITE_OTHER): Payer: Medicare Other

## 2022-10-26 DIAGNOSIS — L97511 Non-pressure chronic ulcer of other part of right foot limited to breakdown of skin: Secondary | ICD-10-CM | POA: Diagnosis not present

## 2022-10-26 DIAGNOSIS — R609 Edema, unspecified: Secondary | ICD-10-CM

## 2022-10-26 DIAGNOSIS — L03031 Cellulitis of right toe: Secondary | ICD-10-CM | POA: Diagnosis not present

## 2022-10-26 MED ORDER — DOXYCYCLINE HYCLATE 100 MG PO TABS: 100.0000 mg | ORAL_TABLET | Freq: Two times a day (BID) | ORAL | 0 refills | Status: AC

## 2022-10-30 NOTE — Progress Notes (Signed)
Subjective: Chief Complaint  Patient presents with   Foot Pain    Patient thinks he may have a foot infection in the right foot, 2nd digit    72 year old male presents the office today for follow-up evaluation of wound in the right second toe.  Since I saw him last he has follow-up with orthopedic surgery as well.  He states they discussed irritation of the tip of the toe but he has not been to proceed with that.  He still has some swelling to the toe.  He does not see any drainage or pus.  He has not had no fevers or chills.  He has no other concerns today.    Objective: AAO x3, NAD DP/PT pulses palpable bilaterally, CRT less than 3 seconds Pitting edema present bilaterally. Hyperkeratotic tissue present distal aspect of right second toe with superficial area skin breakdown.  There is still edema present of the toe and faint erythema.  There is no fluctuation or crepitation but there is no malodor.  There is no probing, undermining or tunneling.  No pain with calf compression, swelling, warmth, erythema  Assessment: Ulceration, resolving cellulitis right second toe  Plan: -All treatment options discussed with the patient including all alternatives, risks, complications.  -X-rays were obtained reviewed.  3 views of the foot were obtained.  With attention directed the second toe there is digital deformity noted.  No definitive cortical destruction to suggest osteomyelitis. -She will be debrided the callus at the tip of the toe.  Superficial granular wound present.  Minimal bleeding occurred.  There is no probing.  Continue offloading, doxycycline.  Unfortunately still at risk of amputation of the toe.  Trula Slade DPM

## 2022-11-12 ENCOUNTER — Encounter: Payer: Self-pay | Admitting: Podiatry

## 2022-11-12 ENCOUNTER — Ambulatory Visit (INDEPENDENT_AMBULATORY_CARE_PROVIDER_SITE_OTHER): Payer: Medicare Other | Admitting: Podiatry

## 2022-11-12 DIAGNOSIS — L97511 Non-pressure chronic ulcer of other part of right foot limited to breakdown of skin: Secondary | ICD-10-CM

## 2022-11-12 DIAGNOSIS — L03031 Cellulitis of right toe: Secondary | ICD-10-CM | POA: Diagnosis not present

## 2022-11-12 NOTE — Patient Instructions (Signed)
Wash with soap and water. Dry well then apply a small amount of antibiotic ointment and a bandage. Monitor for any signs of infection such as increased swelling, redness, drainage, red streaks or any fevers, chills. If any were to occur go to the emergency room.   Continue offloading to keep pressure off of the toe.

## 2022-11-12 NOTE — Progress Notes (Signed)
Subjective: Chief Complaint  Patient presents with   Foot Ulcer    Follow up ulcer 2nd right    "Its still red and swollen"   72 year old male presents the office today for follow-up evaluation of wound in the right second toe.  .  States he still gets some swelling redness but seems to be improved.  Not seen any drainage or pus.  No red streaks.  Denies any fevers or chills.  No other concerns.   Objective: AAO x3, NAD DP/PT pulses palpable bilaterally, CRT less than 3 seconds She has pitting edema present bilaterally.  There is hyperkeratotic tissue with superficial area skin breakdown the distal aspect the right second toe.  There is no probing, undermining time.  There is some residual edema but there is no significant cellulitis today and there is no ascending cellulitis.  No fluctuance or crepitation to this matter. No pain with calf compression, swelling, warmth, erythema  Assessment: Ulceration, resolving cellulitis right second toe  Plan: -All treatment options discussed with the patient including all alternatives, risks, complications.  -Sharply debrided the hyperkeratotic lesion to reveal superficial granular wound present distal aspect the toe.  Recommend antibiotic ointment dressing changes daily.  Continue offloading with offloading pads I dispensed a gel toe cap for him as well and he already had 1 as well.  Discussed importance of daily foot inspection -Will hold further oral antibiotics for now but monitor as it seems to be improving. -Monitor for any clinical signs or symptoms of infection and directed to call the office immediately should any occur or go to the ER.  Return in about 3 weeks (around 12/03/2022).  Trula Slade DPM =

## 2022-11-30 ENCOUNTER — Ambulatory Visit (INDEPENDENT_AMBULATORY_CARE_PROVIDER_SITE_OTHER): Payer: Medicare Other | Admitting: Physician Assistant

## 2022-11-30 ENCOUNTER — Encounter: Payer: Self-pay | Admitting: Physician Assistant

## 2022-11-30 VITALS — BP 146/88 | HR 76 | Temp 98.0°F | Ht 75.0 in | Wt 250.0 lb

## 2022-11-30 DIAGNOSIS — I872 Venous insufficiency (chronic) (peripheral): Secondary | ICD-10-CM | POA: Diagnosis not present

## 2022-11-30 NOTE — Progress Notes (Signed)
Office Note     CC:  follow up Requesting Provider:  Velna Hatchet, MD  HPI: Trevor [name] is a 72 y.o. (1950-11-06) male who presents for follow up of recurrent lower extremity wounds. He has chronic venous insufficiency with prior history of left GSV and SSV ablations in 2011. He was last seen in July of 2023 at which time he was having weekly unna boot changes for right medial malleolus venous ulcer and right 2nd toe wounds. He was advised to follow up in 1 month but he has not returned until today. His current wounds have been present for several years now. He continues to be managed by Dr. Jacqualyn Posey for his wound care- last seen on 11/12/22. He explains the wounds will heal and then they will occur again. He feels usually with Unna boots it will get the wounds to heal so that is why he returns for follow up today. He explains he is unable to tolerate tight compression on his RLE because of a callus at base of  his 5th metatarsal. He wears a light cotton compression sock. He has no concerns on his LLE. He wears a knee high compression on his LLE. He denies any pain on ambulation or rest. No chest pain or abdominal pain. He has history of TEVAR on 06/08/17.  The pt is on a statin for cholesterol management.  The pt is not on a daily aspirin.   Other AC:  none The pt is on CCB, BB, ARB/HCTZ for hypertension.   The pt is not diabetic.  Tobacco hx:  Former  Past Medical History:  Diagnosis Date   Acute pain of left shoulder 04/04/2017   ADD (attention deficit disorder)    Allergy    Anxiety    Arthritis    right foot   Arthritis of midfoot 04/29/2016   Overview:  Added automatically from request for surgery [id number]   Blood transfusion without reported diagnosis    Cataract    removed both eyes with lens implants    Chest wall pain 09/29/2017   Chronic pain of right ankle 04/08/2016   Cough due to ACE inhibitor 03/18/2017   Depression    Descending thoracic aortic aneurysm ([place]) 04/04/2017    Overview:  4.5 cm on CT scan of April 04, 2017.   Diverticulosis of colon 11/25/2015   Dyspnea on exertion 04/04/2017   Essential hypertension 11/25/2015   Executive function deficit 12/31/2015   Family history of bladder cancer 06/14/2022   Family history of breast cancer 06/14/2022   Family history of colon cancer in mother    dx'd age late 38's   Family history of kidney cancer 06/14/2022   Family history of rectal cancer 06/14/2022   Foot pain, right 04/08/2016   Gait abnormality 03/29/2019   H/O blood clots    Health maintenance examination 09/29/2017   History of colonic polyps 02/04/2011   3 10 mm hyperplastic polyps removed from right and left colon 2004 4 polyps, one 10 mm others diminutive and hyperplastic 2007 Followed closer than routine risk due to multiple large hyperplastic polyps 5 adenomas with one of them serrated  05/29/2015 11 polyps max 10 mm adenomas, ssa's, hyperplastic - 09/21/2017 3 diminutive polyps removed adenomas - recall 2021 (2 yrs)      Hypercholesterolemia 11/25/2015   Hyperlipidemia    Hypertension    Left foot pain [id number]   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 07/01/2022   Nocturia more than twice per  night 12/31/2015   Pain in joints 12/31/2015   Pectoralis major tendinitis, right 03/18/2017   Pericarditis    Peripheral neuropathy 04/24/2018   Pneumonia    Primary osteoarthritis of right foot 11/14/2015   Shortness of breath 09/29/2017   Status post right foot surgery 06/23/2016   Strain of right pectoralis muscle 09/07/2016   Thoracic aortic aneurysm ([place]) 06/08/2017   Traumatic brain injury ([place])    1976   Ulcer    Varicose veins    Varicose veins of lower extremities with ulcer ([place]) 11/16/2012    Past Surgical History:  Procedure Laterality Date   BURR HOLE OF CRANIUM  04/10/75   COLONOSCOPY     COLONOSCOPY W/ POLYPECTOMY  2004, 2007, 02/03/11   multiple large hyperplastic polyps 04/07, adenomas and one serrated 2012   ENDOVENOUS ABLATION  SAPHENOUS VEIN W/ LASER Left 09-02-2010   EVLA left great and small saphenous vein by Curt Jews MD    FOOT FUSION Right    [facility]- Robert Tisdall   neck fusion     POLYPECTOMY     ROTATOR CUFF REPAIR Right 2019   rt. knee replacement     THORACIC AORTIC ENDOVASCULAR STENT GRAFT N/A 06/08/2017   Procedure: THORACIC AORTIC ENDOVASCULAR STENT GRAFT;  Surgeon: Rosetta Posner, MD;  Location: [facility] OR;  Service: Vascular;  Laterality: N/A;    Social History   Socioeconomic History   Marital status: Married    Spouse name: Not on file   Number of children: Not on file   Years of education: Not on file   Highest education level: Not on file  Occupational History   Not on file  Tobacco Use   Smoking status: Former    Types: Cigarettes    Quit date: 01/20/1981    Years since quitting: 41.8    Passive exposure: Past (PARENTS SMOKED)   Smokeless tobacco: Never  Vaping Use   Vaping Use: Never used  Substance and Sexual Activity   Alcohol use: Yes    Comment: SOCIALLY BEER   Drug use: No   Sexual activity: Not on file  Other Topics Concern   Not on file  Social History Narrative   Not on file   Social Determinants of Health   Financial Resource Strain: Not on file  Food Insecurity: Not on file  Transportation Needs: Not on file  Physical Activity: Not on file  Stress: Not on file  Social Connections: Not on file  Intimate Partner Violence: Not on file    Family History  Problem Relation Age of Onset   Rectal cancer Mother        dx early 97s; d. 19   Colon polyps Father    Bladder Cancer Father        dx late 50s-early 53s   Breast cancer Sister        metastatic; dx 57s   Kidney cancer Sister 56   Brain cancer Cousin        d. > 49; maternal male cousin   Cancer Cousin        unknown type; d. late 75s; paternal male cousin   Cancer Maternal Grandmother        unknown type; GI? d. 11s   Esophageal cancer Neg Hx    Stomach cancer Neg Hx    Crohn's disease Neg Hx     Pancreatic cancer Neg Hx     Current Outpatient Medications  Medication Sig Dispense Refill   amLODipine (NORVASC)  10 MG tablet daily.     amphetamine-dextroamphetamine (ADDERALL) 30 MG tablet Take 30 mg by mouth 2 (two) times daily.     carvedilol (COREG) 6.25 MG tablet Take 6.25 mg by mouth 2 (two) times daily.     doxycycline (VIBRA-TABS) 100 MG tablet Take 1 tablet (100 mg total) by mouth 2 (two) times daily. 20 tablet 0   DULoxetine (CYMBALTA) 30 MG capsule TAKE ONE CAPSULE BY MOUTH DAILY ORAL     furosemide (LASIX) 40 MG tablet Take 40 mg by mouth daily as needed.     irbesartan-hydrochlorothiazide (AVALIDE) 300-12.5 MG tablet Take 1 tablet by mouth daily.     lovastatin (MEVACOR) 20 MG tablet Take 40 mg by mouth daily.      mupirocin ointment (BACTROBAN) 2 % Apply 1 application topically 2 (two) times daily. 30 g 2   nebivolol (BYSTOLIC) 5 MG tablet Take 5 mg by mouth daily.     silver sulfADIAZINE (SILVADENE) 1 % cream Apply 1 application topically daily. 50 g 0   traMADol (ULTRAM) 50 MG tablet Take 50 mg by mouth 3 (three) times daily as needed for moderate pain.     No current facility-administered medications for this visit.    Allergies  Allergen Reactions   Terazosin Other (See Comments)    dizziness dizziness     REVIEW OF SYSTEMS:   [X]  denotes positive finding, [ ]  denotes negative finding Cardiac  Comments:  Chest pain or chest pressure:    Shortness of breath upon exertion:    Short of breath when lying flat:    Irregular heart rhythm:        Vascular    Pain in calf, thigh, or hip brought on by ambulation:    Pain in feet at night that wakes you up from your sleep:     Blood clot in your veins:    Leg swelling:  X       Pulmonary    Oxygen at home:    Productive cough:     Wheezing:         Neurologic    Sudden weakness in arms or legs:     Sudden numbness in arms or legs:     Sudden onset of difficulty speaking or slurred speech:     Temporary loss of vision in one eye:     Problems with dizziness:         Gastrointestinal    Blood in stool:     Vomited blood:         Genitourinary    Burning when urinating:     Blood in urine:        Psychiatric    Major depression:         Hematologic    Bleeding problems:    Problems with blood clotting too easily:        Skin    Rashes or ulcers:        Constitutional    Fever or chills:      PHYSICAL EXAMINATION:  Vitals:   11/30/22 0841  BP: (!) 146/88  Pulse: 76  Temp: 98 F (36.7 C)  TempSrc: Temporal  SpO2: 98%  Weight: 250 lb (113.4 kg)  Height: 6\' 3"  (1.905 m)    General:  WDWN in NAD; vital signs documented above Gait: Not observed HENT: WNL, normocephalic Pulmonary: normal non-labored breathing , without  wheezing Cardiac: regular HR Extremities: without ischemic changes, without Gangrene , without cellulitis; with  open wounds of right medial malleolus. SS drainage  Musculoskeletal: no muscle wasting or atrophy  Neurologic: A&O X 3;  No focal weakness or paresthesias are detected Psychiatric:  The pt has Normal affect.   ASSESSMENT/PLAN:: 72 y.o. male here for follow up for chronic venous insufficiency with recurrent RLE medial malleolus ulceration. Has known venous insufficiency. Would benefit from repeat venous duplex of RLE since last was in 2017. Will schedule this in near future once Unna boot treatment complete - Encourage daily elevation and compression of BLE - Recommend unna boot. One applied today in office. He will return in 1 week for unna boot change and wound evaluation    Karoline Caldwell, PA-C [facility] MD:   Roxanne Mins

## 2022-12-09 ENCOUNTER — Ambulatory Visit (INDEPENDENT_AMBULATORY_CARE_PROVIDER_SITE_OTHER): Payer: Medicare Other | Admitting: Physician Assistant

## 2022-12-09 VITALS — BP 114/73 | HR 69 | Temp 98.1°F | Resp 16 | Ht 76.0 in | Wt 250.0 lb

## 2022-12-09 DIAGNOSIS — I872 Venous insufficiency (chronic) (peripheral): Secondary | ICD-10-CM

## 2022-12-10 ENCOUNTER — Encounter: Payer: Self-pay | Admitting: Physician Assistant

## 2022-12-10 NOTE — Progress Notes (Signed)
Office Note     CC:  follow up Requesting Provider:  Velna Hatchet, MD  HPI: Trevor [name] is a 72 y.o. ([birth year]) male who presents for evaluation of recurrent right lower extremity venous ulcerations.  He has chronic venous insufficiency.  He has history of left GSV and small saphenous vein ablations.  He was seen in office last week with a new wound of the medial malleolus and was restarted on Unna boot.  He denies any fevers, chills, nausea/vomiting.  He denies any pain with ambulation or at rest.  He has history of TEVAR on 06/08/2017 with Dr. Donnetta Hutching.   Past Medical History:  Diagnosis Date   Acute pain of left shoulder 04/04/2017   ADD (attention deficit disorder)    Allergy    Anxiety    Arthritis    right foot   Arthritis of midfoot 04/29/2016   Overview:  Added automatically from request for surgery [id number]   Blood transfusion without reported diagnosis    Cataract    removed both eyes with lens implants    Chest wall pain 09/29/2017   Chronic pain of right ankle 04/08/2016   Cough due to ACE inhibitor 03/18/2017   Depression    Descending thoracic aortic aneurysm ([place]) 04/04/2017   Overview:  4.5 cm on CT scan of April 04, 2017.   Diverticulosis of colon 11/25/2015   Dyspnea on exertion 04/04/2017   Essential hypertension 11/25/2015   Executive function deficit 12/31/2015   Family history of bladder cancer 06/14/2022   Family history of breast cancer 06/14/2022   Family history of colon cancer in mother    dx'd age late 69's   Family history of kidney cancer 06/14/2022   Family history of rectal cancer 06/14/2022   Foot pain, right 04/08/2016   Gait abnormality 03/29/2019   H/O blood clots    Health maintenance examination 09/29/2017   History of colonic polyps 02/04/2011   3 10 mm hyperplastic polyps removed from right and left colon 2004 4 polyps, one 10 mm others diminutive and hyperplastic 2007 Followed closer than routine risk due to multiple large hyperplastic polyps 5  adenomas with one of them serrated  05/29/2015 11 polyps max 10 mm adenomas, ssa's, hyperplastic - 09/21/2017 3 diminutive polyps removed adenomas - recall 2021 (2 yrs)      Hypercholesterolemia 11/25/2015   Hyperlipidemia    Hypertension    Left foot pain [id number]   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 07/01/2022   Nocturia more than twice per night 12/31/2015   Pain in joints 12/31/2015   Pectoralis major tendinitis, right 03/18/2017   Pericarditis    Peripheral neuropathy 04/24/2018   Pneumonia    Primary osteoarthritis of right foot 11/14/2015   Shortness of breath 09/29/2017   Status post right foot surgery 06/23/2016   Strain of right pectoralis muscle 09/07/2016   Thoracic aortic aneurysm ([place]) 06/08/2017   Traumatic brain injury ([place])    1976   Ulcer    Varicose veins    Varicose veins of lower extremities with ulcer ([place]) 11/16/2012    Past Surgical History:  Procedure Laterality Date   BURR HOLE OF CRANIUM  04/10/75   COLONOSCOPY     COLONOSCOPY W/ POLYPECTOMY  2004, 2007, 02/03/11   multiple large hyperplastic polyps 04/07, adenomas and one serrated 2012   ENDOVENOUS ABLATION SAPHENOUS VEIN W/ LASER Left 09-02-2010   EVLA left great and small saphenous vein by Curt Jews MD  FOOT FUSION Right    [place]   neck fusion     POLYPECTOMY     ROTATOR CUFF REPAIR Right 2019   rt. knee replacement     THORACIC AORTIC ENDOVASCULAR STENT GRAFT N/A 06/08/2017   Procedure: THORACIC AORTIC ENDOVASCULAR STENT GRAFT;  Surgeon: Rosetta Posner, MD;  Location: [facility] OR;  Service: Vascular;  Laterality: N/A;    Social History   Socioeconomic History   Marital status: Married    Spouse name: Not on file   Number of children: Not on file   Years of education: Not on file   Highest education level: Not on file  Occupational History   Not on file  Tobacco Use   Smoking status: Former    Types: Cigarettes    Quit date: 01/20/1981    Years since quitting: 41.9     Passive exposure: Past (PARENTS SMOKED)   Smokeless tobacco: Never  Vaping Use   Vaping Use: Never used  Substance and Sexual Activity   Alcohol use: Yes    Comment: SOCIALLY BEER   Drug use: No   Sexual activity: Not on file  Other Topics Concern   Not on file  Social History Narrative   Not on file   Social Determinants of Health   Financial Resource Strain: Not on file  Food Insecurity: Not on file  Transportation Needs: Not on file  Physical Activity: Not on file  Stress: Not on file  Social Connections: Not on file  Intimate Partner Violence: Not on file    Family History  Problem Relation Age of Onset   Rectal cancer Mother        dx early 88s; d. 70   Colon polyps Father    Bladder Cancer Father        dx late 50s-early 29s   Breast cancer Sister        metastatic; dx 41s   Kidney cancer Sister 57   Brain cancer Cousin        d. > 82; maternal male cousin   Cancer Cousin        unknown type; d. late 81s; paternal male cousin   Cancer Maternal Grandmother        unknown type; GI? d. 56s   Esophageal cancer Neg Hx    Stomach cancer Neg Hx    Crohn's disease Neg Hx    Pancreatic cancer Neg Hx     Current Outpatient Medications  Medication Sig Dispense Refill   amLODipine (NORVASC) 10 MG tablet daily.     amphetamine-dextroamphetamine (ADDERALL) 30 MG tablet Take 30 mg by mouth 2 (two) times daily.     carvedilol (COREG) 6.25 MG tablet Take 6.25 mg by mouth 2 (two) times daily.     doxycycline (VIBRA-TABS) 100 MG tablet Take 1 tablet (100 mg total) by mouth 2 (two) times daily. 20 tablet 0   DULoxetine (CYMBALTA) 30 MG capsule TAKE ONE CAPSULE BY MOUTH DAILY ORAL     furosemide (LASIX) 40 MG tablet Take 40 mg by mouth daily as needed.     irbesartan-hydrochlorothiazide (AVALIDE) 300-12.5 MG tablet Take 1 tablet by mouth daily.     lovastatin (MEVACOR) 20 MG tablet Take 40 mg by mouth daily.      mupirocin ointment (BACTROBAN) 2 % Apply 1 application  topically 2 (two) times daily. 30 g 2   nebivolol (BYSTOLIC) 5 MG tablet Take 5 mg by mouth daily.     silver sulfADIAZINE (  SILVADENE) 1 % cream Apply 1 application topically daily. 50 g 0   traMADol (ULTRAM) 50 MG tablet Take 50 mg by mouth 3 (three) times daily as needed for moderate pain.     No current facility-administered medications for this visit.    Allergies  Allergen Reactions   Terazosin Other (See Comments)    dizziness dizziness     REVIEW OF SYSTEMS:   [X]  denotes positive finding, [ ]  denotes negative finding Cardiac  Comments:  Chest pain or chest pressure:    Shortness of breath upon exertion:    Short of breath when lying flat:    Irregular heart rhythm:        Vascular    Pain in calf, thigh, or hip brought on by ambulation:    Pain in feet at night that wakes you up from your sleep:     Blood clot in your veins:    Leg swelling:         Pulmonary    Oxygen at home:    Productive cough:     Wheezing:         Neurologic    Sudden weakness in arms or legs:     Sudden numbness in arms or legs:     Sudden onset of difficulty speaking or slurred speech:    Temporary loss of vision in one eye:     Problems with dizziness:         Gastrointestinal    Blood in stool:     Vomited blood:         Genitourinary    Burning when urinating:     Blood in urine:        Psychiatric    Major depression:         Hematologic    Bleeding problems:    Problems with blood clotting too easily:        Skin    Rashes or ulcers:        Constitutional    Fever or chills:      PHYSICAL EXAMINATION:  Vitals:   12/09/22 1441  BP: 114/73  Pulse: 69  Resp: 16  Temp: 98.1 F (36.7 C)  TempSrc: Temporal  SpO2: 96%  Weight: 250 lb (113.4 kg)  Height: 6\' 4"  (1.93 m)    General:  WDWN in NAD; vital signs documented above Gait: Not observed HENT: WNL, normocephalic Pulmonary: normal non-labored breathing Cardiac: regular HR Abdomen: soft, NT, no  masses Skin: without rashes Vascular Exam/Pulses: palpable R DP pulse Extremities: Right medial malleolus wound improved compared to last picture Musculoskeletal: no muscle wasting or atrophy  Neurologic: A&O X 3 Psychiatric:  The pt has Normal affect.      ASSESSMENT/PLAN:: 72 y.o. male here for follow up for recurrent right medial lower leg venous ulceration  -Right leg wound has improved over the past week with an Haematologist.  We will change his Unna boot today.  We also contacted [facility] for home Unna boot changes.  They will begin weekly changes starting next week.  He will likely require several more to completely heal the ulceration.  He will then return to daily use of compression socks with periodic elevation and avoiding prolonged sitting and standing.  His right foot is well-perfused with a palpable DP pulse.  He will call/return office sooner with any questions or concerns.   Dagoberto Ligas, PA-C [facility] [phone]  Clinic MD:   Scot Dock

## 2022-12-13 ENCOUNTER — Ambulatory Visit (INDEPENDENT_AMBULATORY_CARE_PROVIDER_SITE_OTHER): Payer: Medicare Other | Admitting: Podiatry

## 2022-12-13 ENCOUNTER — Ambulatory Visit: Payer: Medicare Other

## 2022-12-13 DIAGNOSIS — L97319 Non-pressure chronic ulcer of right ankle with unspecified severity: Secondary | ICD-10-CM

## 2022-12-13 DIAGNOSIS — R52 Pain, unspecified: Secondary | ICD-10-CM

## 2022-12-13 DIAGNOSIS — M2041 Other hammer toe(s) (acquired), right foot: Secondary | ICD-10-CM

## 2022-12-13 DIAGNOSIS — L84 Corns and callosities: Secondary | ICD-10-CM | POA: Diagnosis not present

## 2022-12-13 DIAGNOSIS — I83013 Varicose veins of right lower extremity with ulcer of ankle: Secondary | ICD-10-CM | POA: Diagnosis not present

## 2022-12-13 DIAGNOSIS — M2042 Other hammer toe(s) (acquired), left foot: Secondary | ICD-10-CM

## 2022-12-13 NOTE — Patient Instructions (Signed)
If you have any increased pain, swelling, redness, drainage or any other signs of infection let me know or go to the ER.

## 2022-12-16 DIAGNOSIS — F419 Anxiety disorder, unspecified: Secondary | ICD-10-CM | POA: Diagnosis not present

## 2022-12-16 DIAGNOSIS — I10 Essential (primary) hypertension: Secondary | ICD-10-CM | POA: Diagnosis not present

## 2022-12-16 DIAGNOSIS — G8929 Other chronic pain: Secondary | ICD-10-CM | POA: Diagnosis not present

## 2022-12-16 DIAGNOSIS — R41844 Frontal lobe and executive function deficit: Secondary | ICD-10-CM | POA: Diagnosis not present

## 2022-12-16 DIAGNOSIS — M19071 Primary osteoarthritis, right ankle and foot: Secondary | ICD-10-CM | POA: Diagnosis not present

## 2022-12-16 DIAGNOSIS — I872 Venous insufficiency (chronic) (peripheral): Secondary | ICD-10-CM | POA: Diagnosis not present

## 2022-12-16 DIAGNOSIS — E785 Hyperlipidemia, unspecified: Secondary | ICD-10-CM | POA: Diagnosis not present

## 2022-12-16 DIAGNOSIS — F909 Attention-deficit hyperactivity disorder, unspecified type: Secondary | ICD-10-CM | POA: Diagnosis not present

## 2022-12-16 DIAGNOSIS — G629 Polyneuropathy, unspecified: Secondary | ICD-10-CM | POA: Diagnosis not present

## 2022-12-16 DIAGNOSIS — E7800 Pure hypercholesterolemia, unspecified: Secondary | ICD-10-CM | POA: Diagnosis not present

## 2022-12-16 DIAGNOSIS — I83013 Varicose veins of right lower extremity with ulcer of ankle: Secondary | ICD-10-CM | POA: Diagnosis not present

## 2022-12-16 DIAGNOSIS — L97319 Non-pressure chronic ulcer of right ankle with unspecified severity: Secondary | ICD-10-CM | POA: Diagnosis not present

## 2022-12-16 DIAGNOSIS — M25571 Pain in right ankle and joints of right foot: Secondary | ICD-10-CM | POA: Diagnosis not present

## 2022-12-16 DIAGNOSIS — F32A Depression, unspecified: Secondary | ICD-10-CM | POA: Diagnosis not present

## 2022-12-18 NOTE — Progress Notes (Signed)
Subjective: Chief Complaint  Patient presents with   Callouses    Patient has callouses on bilateral feet     72 year old male presents the office today for concerns of pain to the lateral aspect of the right foot pointing to the fifth metatarsal head.  This been ongoing for the last couple weeks.  He currently has a venous wound he reports in his ankles and in the boot.  He has not reported any recent injuries to his foot no open lesions that he reports.  He states that the second toe is also been healing well without any open sores or any swelling or redness.   Objective: AAO x3, NAD DP/PT pulses palpable bilaterally, CRT less than 3 seconds To evaluate the wound ibuprofen in the boot today.  Granular wound present on medial aspect of the ankle without any drainage or pus.  No fluctuance or crepitation.  No malodor.  There is tenderness palpation along the fifth metatarsal head as well as the base.  There is a hyperkeratotic lesion at the metatarsal base which is preulcerative.  There is no edema, erythema or signs of infection today. Hammertoes present. No pain with calf compression, swelling, warmth, erythema  Assessment: Preulcerative lesion right foot, ulceration right ankle  Plan: -All treatment options discussed with the patient including all alternatives, risks, complications.  -X-rays of the right foot were obtained.  No evidence of acute fracture.  To the lateral aspect of the foot there is no evidence of osteomyelitis.  Likely chronic changes present of the second toe.  Clinically the second toe does not appear to be infected.  Negative calcaneal occupational.  Hardware from prior surgery.  Hammertoes present. Lambert Mody debrided hyperkeratotic lesion right foot without any complications or bleeding.  He needs to monitor this very closely for first breakdown or irritation. -Is seen vascular surgery for the ulceration of the ankle.  Continue Unna boot therapy.  I reapplied this today  as well. -Monitor for any clinical signs or symptoms of infection and directed to call the office immediately should any occur or go to the ER.  Return in about 9 weeks (around 02/14/2023), or if symptoms worsen or fail to improve, for pre-ulcerative callus.  Vivi Barrack DPM

## 2022-12-21 DIAGNOSIS — Z961 Presence of intraocular lens: Secondary | ICD-10-CM | POA: Diagnosis not present

## 2022-12-23 DIAGNOSIS — L97319 Non-pressure chronic ulcer of right ankle with unspecified severity: Secondary | ICD-10-CM | POA: Diagnosis not present

## 2022-12-23 DIAGNOSIS — M25571 Pain in right ankle and joints of right foot: Secondary | ICD-10-CM | POA: Diagnosis not present

## 2022-12-23 DIAGNOSIS — I83013 Varicose veins of right lower extremity with ulcer of ankle: Secondary | ICD-10-CM | POA: Diagnosis not present

## 2022-12-23 DIAGNOSIS — M19071 Primary osteoarthritis, right ankle and foot: Secondary | ICD-10-CM | POA: Diagnosis not present

## 2022-12-23 DIAGNOSIS — G629 Polyneuropathy, unspecified: Secondary | ICD-10-CM | POA: Diagnosis not present

## 2022-12-23 DIAGNOSIS — I872 Venous insufficiency (chronic) (peripheral): Secondary | ICD-10-CM | POA: Diagnosis not present

## 2022-12-28 DIAGNOSIS — M19071 Primary osteoarthritis, right ankle and foot: Secondary | ICD-10-CM | POA: Diagnosis not present

## 2022-12-28 DIAGNOSIS — M14671 Charcot's joint, right ankle and foot: Secondary | ICD-10-CM | POA: Diagnosis not present

## 2022-12-28 DIAGNOSIS — L97511 Non-pressure chronic ulcer of other part of right foot limited to breakdown of skin: Secondary | ICD-10-CM | POA: Diagnosis not present

## 2022-12-28 DIAGNOSIS — Z981 Arthrodesis status: Secondary | ICD-10-CM | POA: Diagnosis not present

## 2023-01-05 ENCOUNTER — Other Ambulatory Visit (HOSPITAL_COMMUNITY): Payer: Self-pay | Admitting: Internal Medicine

## 2023-01-05 DIAGNOSIS — M19071 Primary osteoarthritis, right ankle and foot: Secondary | ICD-10-CM | POA: Diagnosis not present

## 2023-01-05 DIAGNOSIS — G629 Polyneuropathy, unspecified: Secondary | ICD-10-CM | POA: Diagnosis not present

## 2023-01-05 DIAGNOSIS — I872 Venous insufficiency (chronic) (peripheral): Secondary | ICD-10-CM | POA: Diagnosis not present

## 2023-01-05 DIAGNOSIS — L97319 Non-pressure chronic ulcer of right ankle with unspecified severity: Secondary | ICD-10-CM | POA: Diagnosis not present

## 2023-01-05 DIAGNOSIS — I319 Disease of pericardium, unspecified: Secondary | ICD-10-CM

## 2023-01-05 DIAGNOSIS — I83013 Varicose veins of right lower extremity with ulcer of ankle: Secondary | ICD-10-CM | POA: Diagnosis not present

## 2023-01-05 DIAGNOSIS — M25571 Pain in right ankle and joints of right foot: Secondary | ICD-10-CM | POA: Diagnosis not present

## 2023-01-12 DIAGNOSIS — I83013 Varicose veins of right lower extremity with ulcer of ankle: Secondary | ICD-10-CM | POA: Diagnosis not present

## 2023-01-12 DIAGNOSIS — M19071 Primary osteoarthritis, right ankle and foot: Secondary | ICD-10-CM | POA: Diagnosis not present

## 2023-01-12 DIAGNOSIS — G629 Polyneuropathy, unspecified: Secondary | ICD-10-CM | POA: Diagnosis not present

## 2023-01-12 DIAGNOSIS — L97319 Non-pressure chronic ulcer of right ankle with unspecified severity: Secondary | ICD-10-CM | POA: Diagnosis not present

## 2023-01-12 DIAGNOSIS — M25571 Pain in right ankle and joints of right foot: Secondary | ICD-10-CM | POA: Diagnosis not present

## 2023-01-12 DIAGNOSIS — I872 Venous insufficiency (chronic) (peripheral): Secondary | ICD-10-CM | POA: Diagnosis not present

## 2023-01-15 DIAGNOSIS — I872 Venous insufficiency (chronic) (peripheral): Secondary | ICD-10-CM | POA: Diagnosis not present

## 2023-01-15 DIAGNOSIS — E785 Hyperlipidemia, unspecified: Secondary | ICD-10-CM | POA: Diagnosis not present

## 2023-01-15 DIAGNOSIS — F419 Anxiety disorder, unspecified: Secondary | ICD-10-CM | POA: Diagnosis not present

## 2023-01-15 DIAGNOSIS — L97319 Non-pressure chronic ulcer of right ankle with unspecified severity: Secondary | ICD-10-CM | POA: Diagnosis not present

## 2023-01-15 DIAGNOSIS — I83013 Varicose veins of right lower extremity with ulcer of ankle: Secondary | ICD-10-CM | POA: Diagnosis not present

## 2023-01-15 DIAGNOSIS — R41844 Frontal lobe and executive function deficit: Secondary | ICD-10-CM | POA: Diagnosis not present

## 2023-01-15 DIAGNOSIS — I10 Essential (primary) hypertension: Secondary | ICD-10-CM | POA: Diagnosis not present

## 2023-01-15 DIAGNOSIS — M25571 Pain in right ankle and joints of right foot: Secondary | ICD-10-CM | POA: Diagnosis not present

## 2023-01-15 DIAGNOSIS — F32A Depression, unspecified: Secondary | ICD-10-CM | POA: Diagnosis not present

## 2023-01-15 DIAGNOSIS — E7800 Pure hypercholesterolemia, unspecified: Secondary | ICD-10-CM | POA: Diagnosis not present

## 2023-01-15 DIAGNOSIS — M19071 Primary osteoarthritis, right ankle and foot: Secondary | ICD-10-CM | POA: Diagnosis not present

## 2023-01-15 DIAGNOSIS — G629 Polyneuropathy, unspecified: Secondary | ICD-10-CM | POA: Diagnosis not present

## 2023-01-15 DIAGNOSIS — F909 Attention-deficit hyperactivity disorder, unspecified type: Secondary | ICD-10-CM | POA: Diagnosis not present

## 2023-01-15 DIAGNOSIS — G8929 Other chronic pain: Secondary | ICD-10-CM | POA: Diagnosis not present

## 2023-01-19 DIAGNOSIS — G629 Polyneuropathy, unspecified: Secondary | ICD-10-CM | POA: Diagnosis not present

## 2023-01-19 DIAGNOSIS — M19071 Primary osteoarthritis, right ankle and foot: Secondary | ICD-10-CM | POA: Diagnosis not present

## 2023-01-19 DIAGNOSIS — I872 Venous insufficiency (chronic) (peripheral): Secondary | ICD-10-CM | POA: Diagnosis not present

## 2023-01-19 DIAGNOSIS — I83013 Varicose veins of right lower extremity with ulcer of ankle: Secondary | ICD-10-CM | POA: Diagnosis not present

## 2023-01-19 DIAGNOSIS — M25571 Pain in right ankle and joints of right foot: Secondary | ICD-10-CM | POA: Diagnosis not present

## 2023-01-19 DIAGNOSIS — L97319 Non-pressure chronic ulcer of right ankle with unspecified severity: Secondary | ICD-10-CM | POA: Diagnosis not present

## 2023-01-28 DIAGNOSIS — I872 Venous insufficiency (chronic) (peripheral): Secondary | ICD-10-CM | POA: Diagnosis not present

## 2023-01-28 DIAGNOSIS — I83013 Varicose veins of right lower extremity with ulcer of ankle: Secondary | ICD-10-CM | POA: Diagnosis not present

## 2023-01-28 DIAGNOSIS — M25571 Pain in right ankle and joints of right foot: Secondary | ICD-10-CM | POA: Diagnosis not present

## 2023-01-28 DIAGNOSIS — G629 Polyneuropathy, unspecified: Secondary | ICD-10-CM | POA: Diagnosis not present

## 2023-01-28 DIAGNOSIS — M19071 Primary osteoarthritis, right ankle and foot: Secondary | ICD-10-CM | POA: Diagnosis not present

## 2023-01-28 DIAGNOSIS — L97319 Non-pressure chronic ulcer of right ankle with unspecified severity: Secondary | ICD-10-CM | POA: Diagnosis not present

## 2023-02-01 DIAGNOSIS — I872 Venous insufficiency (chronic) (peripheral): Secondary | ICD-10-CM | POA: Diagnosis not present

## 2023-02-01 DIAGNOSIS — M25571 Pain in right ankle and joints of right foot: Secondary | ICD-10-CM | POA: Diagnosis not present

## 2023-02-01 DIAGNOSIS — M19071 Primary osteoarthritis, right ankle and foot: Secondary | ICD-10-CM | POA: Diagnosis not present

## 2023-02-01 DIAGNOSIS — I83013 Varicose veins of right lower extremity with ulcer of ankle: Secondary | ICD-10-CM | POA: Diagnosis not present

## 2023-02-01 DIAGNOSIS — L97319 Non-pressure chronic ulcer of right ankle with unspecified severity: Secondary | ICD-10-CM | POA: Diagnosis not present

## 2023-02-01 DIAGNOSIS — G629 Polyneuropathy, unspecified: Secondary | ICD-10-CM | POA: Diagnosis not present

## 2023-02-04 ENCOUNTER — Ambulatory Visit (HOSPITAL_COMMUNITY): Payer: Medicare Other | Attending: Cardiology

## 2023-02-04 DIAGNOSIS — I319 Disease of pericardium, unspecified: Secondary | ICD-10-CM | POA: Insufficient documentation

## 2023-02-04 DIAGNOSIS — I3131 Malignant pericardial effusion in diseases classified elsewhere: Secondary | ICD-10-CM

## 2023-02-04 LAB — ECHOCARDIOGRAM COMPLETE
Area-P 1/2: 4.26 cm2
S' Lateral: 3 cm

## 2023-02-09 DIAGNOSIS — I83013 Varicose veins of right lower extremity with ulcer of ankle: Secondary | ICD-10-CM | POA: Diagnosis not present

## 2023-02-09 DIAGNOSIS — M25571 Pain in right ankle and joints of right foot: Secondary | ICD-10-CM | POA: Diagnosis not present

## 2023-02-09 DIAGNOSIS — G629 Polyneuropathy, unspecified: Secondary | ICD-10-CM | POA: Diagnosis not present

## 2023-02-09 DIAGNOSIS — L97319 Non-pressure chronic ulcer of right ankle with unspecified severity: Secondary | ICD-10-CM | POA: Diagnosis not present

## 2023-02-09 DIAGNOSIS — I872 Venous insufficiency (chronic) (peripheral): Secondary | ICD-10-CM | POA: Diagnosis not present

## 2023-02-09 DIAGNOSIS — M19071 Primary osteoarthritis, right ankle and foot: Secondary | ICD-10-CM | POA: Diagnosis not present

## 2023-02-14 DIAGNOSIS — L97319 Non-pressure chronic ulcer of right ankle with unspecified severity: Secondary | ICD-10-CM | POA: Diagnosis not present

## 2023-02-14 DIAGNOSIS — F909 Attention-deficit hyperactivity disorder, unspecified type: Secondary | ICD-10-CM | POA: Diagnosis not present

## 2023-02-14 DIAGNOSIS — E7800 Pure hypercholesterolemia, unspecified: Secondary | ICD-10-CM | POA: Diagnosis not present

## 2023-02-14 DIAGNOSIS — I10 Essential (primary) hypertension: Secondary | ICD-10-CM | POA: Diagnosis not present

## 2023-02-14 DIAGNOSIS — F419 Anxiety disorder, unspecified: Secondary | ICD-10-CM | POA: Diagnosis not present

## 2023-02-14 DIAGNOSIS — R41844 Frontal lobe and executive function deficit: Secondary | ICD-10-CM | POA: Diagnosis not present

## 2023-02-14 DIAGNOSIS — M19071 Primary osteoarthritis, right ankle and foot: Secondary | ICD-10-CM | POA: Diagnosis not present

## 2023-02-14 DIAGNOSIS — I83013 Varicose veins of right lower extremity with ulcer of ankle: Secondary | ICD-10-CM | POA: Diagnosis not present

## 2023-02-14 DIAGNOSIS — I872 Venous insufficiency (chronic) (peripheral): Secondary | ICD-10-CM | POA: Diagnosis not present

## 2023-02-14 DIAGNOSIS — G629 Polyneuropathy, unspecified: Secondary | ICD-10-CM | POA: Diagnosis not present

## 2023-02-14 DIAGNOSIS — M25571 Pain in right ankle and joints of right foot: Secondary | ICD-10-CM | POA: Diagnosis not present

## 2023-02-14 DIAGNOSIS — E785 Hyperlipidemia, unspecified: Secondary | ICD-10-CM | POA: Diagnosis not present

## 2023-02-14 DIAGNOSIS — G8929 Other chronic pain: Secondary | ICD-10-CM | POA: Diagnosis not present

## 2023-02-14 DIAGNOSIS — F32A Depression, unspecified: Secondary | ICD-10-CM | POA: Diagnosis not present

## 2023-02-15 DIAGNOSIS — G629 Polyneuropathy, unspecified: Secondary | ICD-10-CM | POA: Diagnosis not present

## 2023-02-15 DIAGNOSIS — I83013 Varicose veins of right lower extremity with ulcer of ankle: Secondary | ICD-10-CM | POA: Diagnosis not present

## 2023-02-15 DIAGNOSIS — M19071 Primary osteoarthritis, right ankle and foot: Secondary | ICD-10-CM | POA: Diagnosis not present

## 2023-02-15 DIAGNOSIS — M25571 Pain in right ankle and joints of right foot: Secondary | ICD-10-CM | POA: Diagnosis not present

## 2023-02-15 DIAGNOSIS — I872 Venous insufficiency (chronic) (peripheral): Secondary | ICD-10-CM | POA: Diagnosis not present

## 2023-02-15 DIAGNOSIS — L97319 Non-pressure chronic ulcer of right ankle with unspecified severity: Secondary | ICD-10-CM | POA: Diagnosis not present

## 2023-02-25 DIAGNOSIS — M25571 Pain in right ankle and joints of right foot: Secondary | ICD-10-CM | POA: Diagnosis not present

## 2023-02-25 DIAGNOSIS — I872 Venous insufficiency (chronic) (peripheral): Secondary | ICD-10-CM | POA: Diagnosis not present

## 2023-02-25 DIAGNOSIS — M19071 Primary osteoarthritis, right ankle and foot: Secondary | ICD-10-CM | POA: Diagnosis not present

## 2023-02-25 DIAGNOSIS — I83013 Varicose veins of right lower extremity with ulcer of ankle: Secondary | ICD-10-CM | POA: Diagnosis not present

## 2023-02-25 DIAGNOSIS — L97319 Non-pressure chronic ulcer of right ankle with unspecified severity: Secondary | ICD-10-CM | POA: Diagnosis not present

## 2023-02-25 DIAGNOSIS — G629 Polyneuropathy, unspecified: Secondary | ICD-10-CM | POA: Diagnosis not present

## 2023-03-03 DIAGNOSIS — I872 Venous insufficiency (chronic) (peripheral): Secondary | ICD-10-CM | POA: Diagnosis not present

## 2023-03-03 DIAGNOSIS — M19071 Primary osteoarthritis, right ankle and foot: Secondary | ICD-10-CM | POA: Diagnosis not present

## 2023-03-03 DIAGNOSIS — M25571 Pain in right ankle and joints of right foot: Secondary | ICD-10-CM | POA: Diagnosis not present

## 2023-03-03 DIAGNOSIS — G629 Polyneuropathy, unspecified: Secondary | ICD-10-CM | POA: Diagnosis not present

## 2023-03-03 DIAGNOSIS — L97319 Non-pressure chronic ulcer of right ankle with unspecified severity: Secondary | ICD-10-CM | POA: Diagnosis not present

## 2023-03-03 DIAGNOSIS — I83013 Varicose veins of right lower extremity with ulcer of ankle: Secondary | ICD-10-CM | POA: Diagnosis not present

## 2023-03-07 DIAGNOSIS — R600 Localized edema: Secondary | ICD-10-CM | POA: Diagnosis not present

## 2023-03-07 DIAGNOSIS — G894 Chronic pain syndrome: Secondary | ICD-10-CM | POA: Diagnosis not present

## 2023-03-07 DIAGNOSIS — N3281 Overactive bladder: Secondary | ICD-10-CM | POA: Diagnosis not present

## 2023-03-07 DIAGNOSIS — I712 Thoracic aortic aneurysm, without rupture, unspecified: Secondary | ICD-10-CM | POA: Diagnosis not present

## 2023-03-07 DIAGNOSIS — K635 Polyp of colon: Secondary | ICD-10-CM | POA: Diagnosis not present

## 2023-03-07 DIAGNOSIS — F330 Major depressive disorder, recurrent, mild: Secondary | ICD-10-CM | POA: Diagnosis not present

## 2023-03-07 DIAGNOSIS — M14679 Charcot's joint, unspecified ankle and foot: Secondary | ICD-10-CM | POA: Diagnosis not present

## 2023-03-07 DIAGNOSIS — L97519 Non-pressure chronic ulcer of other part of right foot with unspecified severity: Secondary | ICD-10-CM | POA: Diagnosis not present

## 2023-03-07 DIAGNOSIS — R7301 Impaired fasting glucose: Secondary | ICD-10-CM | POA: Diagnosis not present

## 2023-03-07 DIAGNOSIS — I10 Essential (primary) hypertension: Secondary | ICD-10-CM | POA: Diagnosis not present

## 2023-03-07 DIAGNOSIS — R0602 Shortness of breath: Secondary | ICD-10-CM | POA: Diagnosis not present

## 2023-03-07 DIAGNOSIS — R2689 Other abnormalities of gait and mobility: Secondary | ICD-10-CM | POA: Diagnosis not present

## 2023-03-08 DIAGNOSIS — G629 Polyneuropathy, unspecified: Secondary | ICD-10-CM | POA: Diagnosis not present

## 2023-03-08 DIAGNOSIS — M2041 Other hammer toe(s) (acquired), right foot: Secondary | ICD-10-CM | POA: Diagnosis not present

## 2023-03-08 DIAGNOSIS — M14671 Charcot's joint, right ankle and foot: Secondary | ICD-10-CM | POA: Diagnosis not present

## 2023-07-05 DIAGNOSIS — M19071 Primary osteoarthritis, right ankle and foot: Secondary | ICD-10-CM | POA: Diagnosis not present

## 2023-07-05 DIAGNOSIS — M14671 Charcot's joint, right ankle and foot: Secondary | ICD-10-CM | POA: Diagnosis not present

## 2023-07-09 DIAGNOSIS — Z23 Encounter for immunization: Secondary | ICD-10-CM | POA: Diagnosis not present

## 2023-08-23 DIAGNOSIS — E11621 Type 2 diabetes mellitus with foot ulcer: Secondary | ICD-10-CM | POA: Diagnosis not present

## 2023-08-23 DIAGNOSIS — M86171 Other acute osteomyelitis, right ankle and foot: Secondary | ICD-10-CM | POA: Diagnosis present

## 2023-08-23 DIAGNOSIS — L97519 Non-pressure chronic ulcer of other part of right foot with unspecified severity: Secondary | ICD-10-CM | POA: Diagnosis not present

## 2023-08-23 DIAGNOSIS — I10 Essential (primary) hypertension: Secondary | ICD-10-CM | POA: Diagnosis present

## 2023-08-23 DIAGNOSIS — G629 Polyneuropathy, unspecified: Secondary | ICD-10-CM | POA: Diagnosis not present

## 2023-08-23 DIAGNOSIS — N400 Enlarged prostate without lower urinary tract symptoms: Secondary | ICD-10-CM | POA: Diagnosis present

## 2023-08-23 DIAGNOSIS — R7989 Other specified abnormal findings of blood chemistry: Secondary | ICD-10-CM | POA: Diagnosis not present

## 2023-08-23 DIAGNOSIS — I96 Gangrene, not elsewhere classified: Secondary | ICD-10-CM | POA: Diagnosis not present

## 2023-08-23 DIAGNOSIS — M2041 Other hammer toe(s) (acquired), right foot: Secondary | ICD-10-CM | POA: Diagnosis not present

## 2023-08-23 DIAGNOSIS — M7989 Other specified soft tissue disorders: Secondary | ICD-10-CM | POA: Diagnosis not present

## 2023-08-23 DIAGNOSIS — E8809 Other disorders of plasma-protein metabolism, not elsewhere classified: Secondary | ICD-10-CM | POA: Diagnosis present

## 2023-08-23 DIAGNOSIS — E1140 Type 2 diabetes mellitus with diabetic neuropathy, unspecified: Secondary | ICD-10-CM | POA: Diagnosis not present

## 2023-08-23 DIAGNOSIS — L97514 Non-pressure chronic ulcer of other part of right foot with necrosis of bone: Secondary | ICD-10-CM | POA: Diagnosis not present

## 2023-08-23 DIAGNOSIS — Z96651 Presence of right artificial knee joint: Secondary | ICD-10-CM | POA: Diagnosis present

## 2023-08-23 DIAGNOSIS — E785 Hyperlipidemia, unspecified: Secondary | ICD-10-CM | POA: Diagnosis not present

## 2023-08-23 DIAGNOSIS — Z955 Presence of coronary angioplasty implant and graft: Secondary | ICD-10-CM | POA: Diagnosis not present

## 2023-08-23 DIAGNOSIS — N3281 Overactive bladder: Secondary | ICD-10-CM | POA: Diagnosis present

## 2023-08-23 DIAGNOSIS — E876 Hypokalemia: Secondary | ICD-10-CM | POA: Diagnosis not present

## 2023-08-23 DIAGNOSIS — E1169 Type 2 diabetes mellitus with other specified complication: Secondary | ICD-10-CM | POA: Diagnosis not present

## 2023-08-23 DIAGNOSIS — L089 Local infection of the skin and subcutaneous tissue, unspecified: Secondary | ICD-10-CM | POA: Diagnosis not present

## 2023-08-23 DIAGNOSIS — M14671 Charcot's joint, right ankle and foot: Secondary | ICD-10-CM | POA: Diagnosis not present

## 2023-08-23 DIAGNOSIS — F32A Depression, unspecified: Secondary | ICD-10-CM | POA: Diagnosis present

## 2023-08-23 DIAGNOSIS — M205X1 Other deformities of toe(s) (acquired), right foot: Secondary | ICD-10-CM | POA: Diagnosis not present

## 2023-08-26 ENCOUNTER — Ambulatory Visit: Payer: Medicare Other | Admitting: Podiatry

## 2023-09-03 DIAGNOSIS — Z89421 Acquired absence of other right toe(s): Secondary | ICD-10-CM | POA: Diagnosis not present

## 2023-09-03 DIAGNOSIS — F32A Depression, unspecified: Secondary | ICD-10-CM | POA: Diagnosis not present

## 2023-09-03 DIAGNOSIS — Z955 Presence of coronary angioplasty implant and graft: Secondary | ICD-10-CM | POA: Diagnosis not present

## 2023-09-03 DIAGNOSIS — M86171 Other acute osteomyelitis, right ankle and foot: Secondary | ICD-10-CM | POA: Diagnosis not present

## 2023-09-03 DIAGNOSIS — Z87820 Personal history of traumatic brain injury: Secondary | ICD-10-CM | POA: Diagnosis not present

## 2023-09-03 DIAGNOSIS — Z4781 Encounter for orthopedic aftercare following surgical amputation: Secondary | ICD-10-CM | POA: Diagnosis not present

## 2023-09-03 DIAGNOSIS — I872 Venous insufficiency (chronic) (peripheral): Secondary | ICD-10-CM | POA: Diagnosis not present

## 2023-09-03 DIAGNOSIS — E785 Hyperlipidemia, unspecified: Secondary | ICD-10-CM | POA: Diagnosis not present

## 2023-09-03 DIAGNOSIS — Z9181 History of falling: Secondary | ICD-10-CM | POA: Diagnosis not present

## 2023-09-03 DIAGNOSIS — Z981 Arthrodesis status: Secondary | ICD-10-CM | POA: Diagnosis not present

## 2023-09-03 DIAGNOSIS — Z7982 Long term (current) use of aspirin: Secondary | ICD-10-CM | POA: Diagnosis not present

## 2023-09-03 DIAGNOSIS — Z87891 Personal history of nicotine dependence: Secondary | ICD-10-CM | POA: Diagnosis not present

## 2023-09-03 DIAGNOSIS — I119 Hypertensive heart disease without heart failure: Secondary | ICD-10-CM | POA: Diagnosis not present

## 2023-09-03 DIAGNOSIS — N3281 Overactive bladder: Secondary | ICD-10-CM | POA: Diagnosis not present

## 2023-09-03 DIAGNOSIS — Z96651 Presence of right artificial knee joint: Secondary | ICD-10-CM | POA: Diagnosis not present

## 2023-09-03 DIAGNOSIS — N400 Enlarged prostate without lower urinary tract symptoms: Secondary | ICD-10-CM | POA: Diagnosis not present

## 2023-09-03 DIAGNOSIS — M14671 Charcot's joint, right ankle and foot: Secondary | ICD-10-CM | POA: Diagnosis not present

## 2023-09-03 DIAGNOSIS — E8809 Other disorders of plasma-protein metabolism, not elsewhere classified: Secondary | ICD-10-CM | POA: Diagnosis not present

## 2023-09-03 DIAGNOSIS — Z791 Long term (current) use of non-steroidal anti-inflammatories (NSAID): Secondary | ICD-10-CM | POA: Diagnosis not present

## 2023-09-05 DIAGNOSIS — M14671 Charcot's joint, right ankle and foot: Secondary | ICD-10-CM | POA: Diagnosis not present

## 2023-09-05 DIAGNOSIS — M86171 Other acute osteomyelitis, right ankle and foot: Secondary | ICD-10-CM | POA: Diagnosis not present

## 2023-09-05 DIAGNOSIS — Z4781 Encounter for orthopedic aftercare following surgical amputation: Secondary | ICD-10-CM | POA: Diagnosis not present

## 2023-09-05 DIAGNOSIS — I872 Venous insufficiency (chronic) (peripheral): Secondary | ICD-10-CM | POA: Diagnosis not present

## 2023-09-05 DIAGNOSIS — Z89421 Acquired absence of other right toe(s): Secondary | ICD-10-CM | POA: Diagnosis not present

## 2023-09-05 DIAGNOSIS — I119 Hypertensive heart disease without heart failure: Secondary | ICD-10-CM | POA: Diagnosis not present

## 2023-09-12 DIAGNOSIS — N3281 Overactive bladder: Secondary | ICD-10-CM | POA: Diagnosis not present

## 2023-09-12 DIAGNOSIS — I872 Venous insufficiency (chronic) (peripheral): Secondary | ICD-10-CM | POA: Diagnosis not present

## 2023-09-12 DIAGNOSIS — Z4781 Encounter for orthopedic aftercare following surgical amputation: Secondary | ICD-10-CM | POA: Diagnosis not present

## 2023-09-12 DIAGNOSIS — N400 Enlarged prostate without lower urinary tract symptoms: Secondary | ICD-10-CM | POA: Diagnosis not present

## 2023-09-12 DIAGNOSIS — Z89421 Acquired absence of other right toe(s): Secondary | ICD-10-CM | POA: Diagnosis not present

## 2023-09-12 DIAGNOSIS — F32A Depression, unspecified: Secondary | ICD-10-CM | POA: Diagnosis not present

## 2023-09-12 DIAGNOSIS — M14671 Charcot's joint, right ankle and foot: Secondary | ICD-10-CM | POA: Diagnosis not present

## 2023-09-12 DIAGNOSIS — Z7982 Long term (current) use of aspirin: Secondary | ICD-10-CM | POA: Diagnosis not present

## 2023-09-12 DIAGNOSIS — E785 Hyperlipidemia, unspecified: Secondary | ICD-10-CM | POA: Diagnosis not present

## 2023-09-12 DIAGNOSIS — M86171 Other acute osteomyelitis, right ankle and foot: Secondary | ICD-10-CM | POA: Diagnosis not present

## 2023-09-12 DIAGNOSIS — I119 Hypertensive heart disease without heart failure: Secondary | ICD-10-CM | POA: Diagnosis not present

## 2023-09-12 DIAGNOSIS — E8809 Other disorders of plasma-protein metabolism, not elsewhere classified: Secondary | ICD-10-CM | POA: Diagnosis not present

## 2023-09-19 DIAGNOSIS — Z4781 Encounter for orthopedic aftercare following surgical amputation: Secondary | ICD-10-CM | POA: Diagnosis not present

## 2023-09-19 DIAGNOSIS — M14671 Charcot's joint, right ankle and foot: Secondary | ICD-10-CM | POA: Diagnosis not present

## 2023-09-19 DIAGNOSIS — M86171 Other acute osteomyelitis, right ankle and foot: Secondary | ICD-10-CM | POA: Diagnosis not present

## 2023-09-19 DIAGNOSIS — I872 Venous insufficiency (chronic) (peripheral): Secondary | ICD-10-CM | POA: Diagnosis not present

## 2023-09-19 DIAGNOSIS — I119 Hypertensive heart disease without heart failure: Secondary | ICD-10-CM | POA: Diagnosis not present

## 2023-09-19 DIAGNOSIS — Z89421 Acquired absence of other right toe(s): Secondary | ICD-10-CM | POA: Diagnosis not present

## 2023-09-20 DIAGNOSIS — I872 Venous insufficiency (chronic) (peripheral): Secondary | ICD-10-CM | POA: Diagnosis not present

## 2023-09-20 DIAGNOSIS — M86171 Other acute osteomyelitis, right ankle and foot: Secondary | ICD-10-CM | POA: Diagnosis not present

## 2023-09-20 DIAGNOSIS — Z4781 Encounter for orthopedic aftercare following surgical amputation: Secondary | ICD-10-CM | POA: Diagnosis not present

## 2023-09-20 DIAGNOSIS — I119 Hypertensive heart disease without heart failure: Secondary | ICD-10-CM | POA: Diagnosis not present

## 2023-09-20 DIAGNOSIS — Z89421 Acquired absence of other right toe(s): Secondary | ICD-10-CM | POA: Diagnosis not present

## 2023-09-20 DIAGNOSIS — M14671 Charcot's joint, right ankle and foot: Secondary | ICD-10-CM | POA: Diagnosis not present

## 2023-09-21 DIAGNOSIS — I872 Venous insufficiency (chronic) (peripheral): Secondary | ICD-10-CM | POA: Diagnosis not present

## 2023-09-21 DIAGNOSIS — M86171 Other acute osteomyelitis, right ankle and foot: Secondary | ICD-10-CM | POA: Diagnosis not present

## 2023-09-21 DIAGNOSIS — Z89421 Acquired absence of other right toe(s): Secondary | ICD-10-CM | POA: Diagnosis not present

## 2023-09-21 DIAGNOSIS — M14671 Charcot's joint, right ankle and foot: Secondary | ICD-10-CM | POA: Diagnosis not present

## 2023-09-21 DIAGNOSIS — Z4781 Encounter for orthopedic aftercare following surgical amputation: Secondary | ICD-10-CM | POA: Diagnosis not present

## 2023-09-21 DIAGNOSIS — I119 Hypertensive heart disease without heart failure: Secondary | ICD-10-CM | POA: Diagnosis not present

## 2023-09-27 DIAGNOSIS — Z4781 Encounter for orthopedic aftercare following surgical amputation: Secondary | ICD-10-CM | POA: Diagnosis not present

## 2023-09-27 DIAGNOSIS — M86171 Other acute osteomyelitis, right ankle and foot: Secondary | ICD-10-CM | POA: Diagnosis not present

## 2023-09-27 DIAGNOSIS — I872 Venous insufficiency (chronic) (peripheral): Secondary | ICD-10-CM | POA: Diagnosis not present

## 2023-09-27 DIAGNOSIS — I119 Hypertensive heart disease without heart failure: Secondary | ICD-10-CM | POA: Diagnosis not present

## 2023-09-27 DIAGNOSIS — Z89421 Acquired absence of other right toe(s): Secondary | ICD-10-CM | POA: Diagnosis not present

## 2023-09-27 DIAGNOSIS — M14671 Charcot's joint, right ankle and foot: Secondary | ICD-10-CM | POA: Diagnosis not present

## 2023-09-28 DIAGNOSIS — I872 Venous insufficiency (chronic) (peripheral): Secondary | ICD-10-CM | POA: Diagnosis not present

## 2023-09-28 DIAGNOSIS — Z89421 Acquired absence of other right toe(s): Secondary | ICD-10-CM | POA: Diagnosis not present

## 2023-09-28 DIAGNOSIS — M86171 Other acute osteomyelitis, right ankle and foot: Secondary | ICD-10-CM | POA: Diagnosis not present

## 2023-09-28 DIAGNOSIS — I119 Hypertensive heart disease without heart failure: Secondary | ICD-10-CM | POA: Diagnosis not present

## 2023-09-28 DIAGNOSIS — M14671 Charcot's joint, right ankle and foot: Secondary | ICD-10-CM | POA: Diagnosis not present

## 2023-09-28 DIAGNOSIS — Z4781 Encounter for orthopedic aftercare following surgical amputation: Secondary | ICD-10-CM | POA: Diagnosis not present

## 2023-10-03 DIAGNOSIS — Z4781 Encounter for orthopedic aftercare following surgical amputation: Secondary | ICD-10-CM | POA: Diagnosis not present

## 2023-10-03 DIAGNOSIS — Z7982 Long term (current) use of aspirin: Secondary | ICD-10-CM | POA: Diagnosis not present

## 2023-10-03 DIAGNOSIS — I119 Hypertensive heart disease without heart failure: Secondary | ICD-10-CM | POA: Diagnosis not present

## 2023-10-03 DIAGNOSIS — Z87820 Personal history of traumatic brain injury: Secondary | ICD-10-CM | POA: Diagnosis not present

## 2023-10-03 DIAGNOSIS — M86171 Other acute osteomyelitis, right ankle and foot: Secondary | ICD-10-CM | POA: Diagnosis not present

## 2023-10-03 DIAGNOSIS — Z87891 Personal history of nicotine dependence: Secondary | ICD-10-CM | POA: Diagnosis not present

## 2023-10-03 DIAGNOSIS — Z791 Long term (current) use of non-steroidal anti-inflammatories (NSAID): Secondary | ICD-10-CM | POA: Diagnosis not present

## 2023-10-03 DIAGNOSIS — E8809 Other disorders of plasma-protein metabolism, not elsewhere classified: Secondary | ICD-10-CM | POA: Diagnosis not present

## 2023-10-03 DIAGNOSIS — I872 Venous insufficiency (chronic) (peripheral): Secondary | ICD-10-CM | POA: Diagnosis not present

## 2023-10-03 DIAGNOSIS — F32A Depression, unspecified: Secondary | ICD-10-CM | POA: Diagnosis not present

## 2023-10-03 DIAGNOSIS — Z96651 Presence of right artificial knee joint: Secondary | ICD-10-CM | POA: Diagnosis not present

## 2023-10-03 DIAGNOSIS — N3281 Overactive bladder: Secondary | ICD-10-CM | POA: Diagnosis not present

## 2023-10-03 DIAGNOSIS — E785 Hyperlipidemia, unspecified: Secondary | ICD-10-CM | POA: Diagnosis not present

## 2023-10-03 DIAGNOSIS — Z9181 History of falling: Secondary | ICD-10-CM | POA: Diagnosis not present

## 2023-10-03 DIAGNOSIS — Z955 Presence of coronary angioplasty implant and graft: Secondary | ICD-10-CM | POA: Diagnosis not present

## 2023-10-03 DIAGNOSIS — N400 Enlarged prostate without lower urinary tract symptoms: Secondary | ICD-10-CM | POA: Diagnosis not present

## 2023-10-03 DIAGNOSIS — M14671 Charcot's joint, right ankle and foot: Secondary | ICD-10-CM | POA: Diagnosis not present

## 2023-10-03 DIAGNOSIS — Z981 Arthrodesis status: Secondary | ICD-10-CM | POA: Diagnosis not present

## 2023-10-03 DIAGNOSIS — Z89421 Acquired absence of other right toe(s): Secondary | ICD-10-CM | POA: Diagnosis not present

## 2023-10-05 DIAGNOSIS — Z4781 Encounter for orthopedic aftercare following surgical amputation: Secondary | ICD-10-CM | POA: Diagnosis not present

## 2023-10-05 DIAGNOSIS — M14671 Charcot's joint, right ankle and foot: Secondary | ICD-10-CM | POA: Diagnosis not present

## 2023-10-05 DIAGNOSIS — M86171 Other acute osteomyelitis, right ankle and foot: Secondary | ICD-10-CM | POA: Diagnosis not present

## 2023-10-05 DIAGNOSIS — I119 Hypertensive heart disease without heart failure: Secondary | ICD-10-CM | POA: Diagnosis not present

## 2023-10-05 DIAGNOSIS — Z89421 Acquired absence of other right toe(s): Secondary | ICD-10-CM | POA: Diagnosis not present

## 2023-10-05 DIAGNOSIS — I872 Venous insufficiency (chronic) (peripheral): Secondary | ICD-10-CM | POA: Diagnosis not present

## 2023-10-11 DIAGNOSIS — I119 Hypertensive heart disease without heart failure: Secondary | ICD-10-CM | POA: Diagnosis not present

## 2023-10-11 DIAGNOSIS — Z4781 Encounter for orthopedic aftercare following surgical amputation: Secondary | ICD-10-CM | POA: Diagnosis not present

## 2023-10-11 DIAGNOSIS — M14671 Charcot's joint, right ankle and foot: Secondary | ICD-10-CM | POA: Diagnosis not present

## 2023-10-11 DIAGNOSIS — Z89421 Acquired absence of other right toe(s): Secondary | ICD-10-CM | POA: Diagnosis not present

## 2023-10-11 DIAGNOSIS — M86171 Other acute osteomyelitis, right ankle and foot: Secondary | ICD-10-CM | POA: Diagnosis not present

## 2023-10-11 DIAGNOSIS — I872 Venous insufficiency (chronic) (peripheral): Secondary | ICD-10-CM | POA: Diagnosis not present

## 2023-10-13 DIAGNOSIS — M86171 Other acute osteomyelitis, right ankle and foot: Secondary | ICD-10-CM | POA: Diagnosis not present

## 2023-10-13 DIAGNOSIS — M14671 Charcot's joint, right ankle and foot: Secondary | ICD-10-CM | POA: Diagnosis not present

## 2023-10-13 DIAGNOSIS — I872 Venous insufficiency (chronic) (peripheral): Secondary | ICD-10-CM | POA: Diagnosis not present

## 2023-10-13 DIAGNOSIS — Z4781 Encounter for orthopedic aftercare following surgical amputation: Secondary | ICD-10-CM | POA: Diagnosis not present

## 2023-10-13 DIAGNOSIS — Z89421 Acquired absence of other right toe(s): Secondary | ICD-10-CM | POA: Diagnosis not present

## 2023-10-13 DIAGNOSIS — I119 Hypertensive heart disease without heart failure: Secondary | ICD-10-CM | POA: Diagnosis not present

## 2023-10-18 DIAGNOSIS — I872 Venous insufficiency (chronic) (peripheral): Secondary | ICD-10-CM | POA: Diagnosis not present

## 2023-10-18 DIAGNOSIS — I119 Hypertensive heart disease without heart failure: Secondary | ICD-10-CM | POA: Diagnosis not present

## 2023-10-18 DIAGNOSIS — Z89421 Acquired absence of other right toe(s): Secondary | ICD-10-CM | POA: Diagnosis not present

## 2023-10-18 DIAGNOSIS — M14671 Charcot's joint, right ankle and foot: Secondary | ICD-10-CM | POA: Diagnosis not present

## 2023-10-18 DIAGNOSIS — Z4781 Encounter for orthopedic aftercare following surgical amputation: Secondary | ICD-10-CM | POA: Diagnosis not present

## 2023-10-18 DIAGNOSIS — M86171 Other acute osteomyelitis, right ankle and foot: Secondary | ICD-10-CM | POA: Diagnosis not present

## 2023-10-20 DIAGNOSIS — I119 Hypertensive heart disease without heart failure: Secondary | ICD-10-CM | POA: Diagnosis not present

## 2023-10-20 DIAGNOSIS — M14671 Charcot's joint, right ankle and foot: Secondary | ICD-10-CM | POA: Diagnosis not present

## 2023-10-20 DIAGNOSIS — Z4781 Encounter for orthopedic aftercare following surgical amputation: Secondary | ICD-10-CM | POA: Diagnosis not present

## 2023-10-20 DIAGNOSIS — I872 Venous insufficiency (chronic) (peripheral): Secondary | ICD-10-CM | POA: Diagnosis not present

## 2023-10-20 DIAGNOSIS — Z89421 Acquired absence of other right toe(s): Secondary | ICD-10-CM | POA: Diagnosis not present

## 2023-10-20 DIAGNOSIS — M86171 Other acute osteomyelitis, right ankle and foot: Secondary | ICD-10-CM | POA: Diagnosis not present

## 2023-10-28 DIAGNOSIS — Z4781 Encounter for orthopedic aftercare following surgical amputation: Secondary | ICD-10-CM | POA: Diagnosis not present

## 2023-10-28 DIAGNOSIS — I872 Venous insufficiency (chronic) (peripheral): Secondary | ICD-10-CM | POA: Diagnosis not present

## 2023-10-28 DIAGNOSIS — M14671 Charcot's joint, right ankle and foot: Secondary | ICD-10-CM | POA: Diagnosis not present

## 2023-10-28 DIAGNOSIS — Z89421 Acquired absence of other right toe(s): Secondary | ICD-10-CM | POA: Diagnosis not present

## 2023-10-28 DIAGNOSIS — I119 Hypertensive heart disease without heart failure: Secondary | ICD-10-CM | POA: Diagnosis not present

## 2023-10-28 DIAGNOSIS — M86171 Other acute osteomyelitis, right ankle and foot: Secondary | ICD-10-CM | POA: Diagnosis not present

## 2023-10-31 ENCOUNTER — Telehealth: Payer: Self-pay | Admitting: Podiatry

## 2023-10-31 DIAGNOSIS — I119 Hypertensive heart disease without heart failure: Secondary | ICD-10-CM | POA: Diagnosis not present

## 2023-10-31 DIAGNOSIS — I872 Venous insufficiency (chronic) (peripheral): Secondary | ICD-10-CM | POA: Diagnosis not present

## 2023-10-31 DIAGNOSIS — Z89421 Acquired absence of other right toe(s): Secondary | ICD-10-CM | POA: Diagnosis not present

## 2023-10-31 DIAGNOSIS — M86171 Other acute osteomyelitis, right ankle and foot: Secondary | ICD-10-CM | POA: Diagnosis not present

## 2023-10-31 DIAGNOSIS — Z4781 Encounter for orthopedic aftercare following surgical amputation: Secondary | ICD-10-CM | POA: Diagnosis not present

## 2023-10-31 DIAGNOSIS — M14671 Charcot's joint, right ankle and foot: Secondary | ICD-10-CM | POA: Diagnosis not present

## 2023-10-31 NOTE — Telephone Encounter (Signed)
Received vm on 2/14 at 313pm stating he needed an appt asap due to left foot calluses causing pain.  Returned call and left message for Zella Ball ( pts spouse) to call to schedule the appt.

## 2023-11-01 DIAGNOSIS — N400 Enlarged prostate without lower urinary tract symptoms: Secondary | ICD-10-CM | POA: Diagnosis not present

## 2023-11-01 DIAGNOSIS — I129 Hypertensive chronic kidney disease with stage 1 through stage 4 chronic kidney disease, or unspecified chronic kidney disease: Secondary | ICD-10-CM | POA: Diagnosis not present

## 2023-11-01 DIAGNOSIS — E785 Hyperlipidemia, unspecified: Secondary | ICD-10-CM | POA: Diagnosis not present

## 2023-11-01 DIAGNOSIS — N1831 Chronic kidney disease, stage 3a: Secondary | ICD-10-CM | POA: Diagnosis not present

## 2023-11-02 DIAGNOSIS — N3281 Overactive bladder: Secondary | ICD-10-CM | POA: Diagnosis not present

## 2023-11-02 DIAGNOSIS — Z7982 Long term (current) use of aspirin: Secondary | ICD-10-CM | POA: Diagnosis not present

## 2023-11-02 DIAGNOSIS — Z87891 Personal history of nicotine dependence: Secondary | ICD-10-CM | POA: Diagnosis not present

## 2023-11-02 DIAGNOSIS — Z791 Long term (current) use of non-steroidal anti-inflammatories (NSAID): Secondary | ICD-10-CM | POA: Diagnosis not present

## 2023-11-02 DIAGNOSIS — Z8619 Personal history of other infectious and parasitic diseases: Secondary | ICD-10-CM | POA: Diagnosis not present

## 2023-11-02 DIAGNOSIS — N400 Enlarged prostate without lower urinary tract symptoms: Secondary | ICD-10-CM | POA: Diagnosis not present

## 2023-11-02 DIAGNOSIS — Z9181 History of falling: Secondary | ICD-10-CM | POA: Diagnosis not present

## 2023-11-02 DIAGNOSIS — M21371 Foot drop, right foot: Secondary | ICD-10-CM | POA: Diagnosis not present

## 2023-11-02 DIAGNOSIS — Z4781 Encounter for orthopedic aftercare following surgical amputation: Secondary | ICD-10-CM | POA: Diagnosis not present

## 2023-11-02 DIAGNOSIS — Z89421 Acquired absence of other right toe(s): Secondary | ICD-10-CM | POA: Diagnosis not present

## 2023-11-02 DIAGNOSIS — F32A Depression, unspecified: Secondary | ICD-10-CM | POA: Diagnosis not present

## 2023-11-02 DIAGNOSIS — Z87820 Personal history of traumatic brain injury: Secondary | ICD-10-CM | POA: Diagnosis not present

## 2023-11-02 DIAGNOSIS — I119 Hypertensive heart disease without heart failure: Secondary | ICD-10-CM | POA: Diagnosis not present

## 2023-11-02 DIAGNOSIS — M14671 Charcot's joint, right ankle and foot: Secondary | ICD-10-CM | POA: Diagnosis not present

## 2023-11-02 DIAGNOSIS — Z96651 Presence of right artificial knee joint: Secondary | ICD-10-CM | POA: Diagnosis not present

## 2023-11-02 DIAGNOSIS — E785 Hyperlipidemia, unspecified: Secondary | ICD-10-CM | POA: Diagnosis not present

## 2023-11-02 DIAGNOSIS — Z981 Arthrodesis status: Secondary | ICD-10-CM | POA: Diagnosis not present

## 2023-11-02 DIAGNOSIS — Z955 Presence of coronary angioplasty implant and graft: Secondary | ICD-10-CM | POA: Diagnosis not present

## 2023-11-02 DIAGNOSIS — E8809 Other disorders of plasma-protein metabolism, not elsewhere classified: Secondary | ICD-10-CM | POA: Diagnosis not present

## 2023-11-02 DIAGNOSIS — I872 Venous insufficiency (chronic) (peripheral): Secondary | ICD-10-CM | POA: Diagnosis not present

## 2023-11-04 ENCOUNTER — Ambulatory Visit: Payer: Medicare Other | Admitting: Podiatry

## 2023-11-04 DIAGNOSIS — M21371 Foot drop, right foot: Secondary | ICD-10-CM | POA: Diagnosis not present

## 2023-11-04 DIAGNOSIS — I872 Venous insufficiency (chronic) (peripheral): Secondary | ICD-10-CM | POA: Diagnosis not present

## 2023-11-04 DIAGNOSIS — M14671 Charcot's joint, right ankle and foot: Secondary | ICD-10-CM | POA: Diagnosis not present

## 2023-11-04 DIAGNOSIS — Z89421 Acquired absence of other right toe(s): Secondary | ICD-10-CM | POA: Diagnosis not present

## 2023-11-04 DIAGNOSIS — Z4781 Encounter for orthopedic aftercare following surgical amputation: Secondary | ICD-10-CM | POA: Diagnosis not present

## 2023-11-04 DIAGNOSIS — I119 Hypertensive heart disease without heart failure: Secondary | ICD-10-CM | POA: Diagnosis not present

## 2023-11-08 DIAGNOSIS — I10 Essential (primary) hypertension: Secondary | ICD-10-CM | POA: Diagnosis not present

## 2023-11-08 DIAGNOSIS — Z0000 Encounter for general adult medical examination without abnormal findings: Secondary | ICD-10-CM | POA: Diagnosis not present

## 2023-11-08 DIAGNOSIS — I3139 Other pericardial effusion (noninflammatory): Secondary | ICD-10-CM | POA: Diagnosis not present

## 2023-11-08 DIAGNOSIS — M14671 Charcot's joint, right ankle and foot: Secondary | ICD-10-CM | POA: Diagnosis not present

## 2023-11-08 DIAGNOSIS — Z89421 Acquired absence of other right toe(s): Secondary | ICD-10-CM | POA: Diagnosis not present

## 2023-11-08 DIAGNOSIS — I119 Hypertensive heart disease without heart failure: Secondary | ICD-10-CM | POA: Diagnosis not present

## 2023-11-08 DIAGNOSIS — Z4781 Encounter for orthopedic aftercare following surgical amputation: Secondary | ICD-10-CM | POA: Diagnosis not present

## 2023-11-08 DIAGNOSIS — N1831 Chronic kidney disease, stage 3a: Secondary | ICD-10-CM | POA: Diagnosis not present

## 2023-11-08 DIAGNOSIS — M869 Osteomyelitis, unspecified: Secondary | ICD-10-CM | POA: Diagnosis not present

## 2023-11-08 DIAGNOSIS — I129 Hypertensive chronic kidney disease with stage 1 through stage 4 chronic kidney disease, or unspecified chronic kidney disease: Secondary | ICD-10-CM | POA: Diagnosis not present

## 2023-11-08 DIAGNOSIS — I712 Thoracic aortic aneurysm, without rupture, unspecified: Secondary | ICD-10-CM | POA: Diagnosis not present

## 2023-11-08 DIAGNOSIS — M21371 Foot drop, right foot: Secondary | ICD-10-CM | POA: Diagnosis not present

## 2023-11-08 DIAGNOSIS — Z1331 Encounter for screening for depression: Secondary | ICD-10-CM | POA: Diagnosis not present

## 2023-11-08 DIAGNOSIS — F330 Major depressive disorder, recurrent, mild: Secondary | ICD-10-CM | POA: Diagnosis not present

## 2023-11-08 DIAGNOSIS — G894 Chronic pain syndrome: Secondary | ICD-10-CM | POA: Diagnosis not present

## 2023-11-08 DIAGNOSIS — S98131A Complete traumatic amputation of one right lesser toe, initial encounter: Secondary | ICD-10-CM | POA: Diagnosis not present

## 2023-11-08 DIAGNOSIS — I872 Venous insufficiency (chronic) (peripheral): Secondary | ICD-10-CM | POA: Diagnosis not present

## 2023-11-08 DIAGNOSIS — Z1339 Encounter for screening examination for other mental health and behavioral disorders: Secondary | ICD-10-CM | POA: Diagnosis not present

## 2023-11-08 DIAGNOSIS — M14679 Charcot's joint, unspecified ankle and foot: Secondary | ICD-10-CM | POA: Diagnosis not present

## 2023-11-10 DIAGNOSIS — M21371 Foot drop, right foot: Secondary | ICD-10-CM | POA: Diagnosis not present

## 2023-11-10 DIAGNOSIS — Z4781 Encounter for orthopedic aftercare following surgical amputation: Secondary | ICD-10-CM | POA: Diagnosis not present

## 2023-11-10 DIAGNOSIS — M14671 Charcot's joint, right ankle and foot: Secondary | ICD-10-CM | POA: Diagnosis not present

## 2023-11-10 DIAGNOSIS — Z89421 Acquired absence of other right toe(s): Secondary | ICD-10-CM | POA: Diagnosis not present

## 2023-11-10 DIAGNOSIS — I872 Venous insufficiency (chronic) (peripheral): Secondary | ICD-10-CM | POA: Diagnosis not present

## 2023-11-10 DIAGNOSIS — I119 Hypertensive heart disease without heart failure: Secondary | ICD-10-CM | POA: Diagnosis not present

## 2023-11-11 ENCOUNTER — Ambulatory Visit (INDEPENDENT_AMBULATORY_CARE_PROVIDER_SITE_OTHER): Payer: Medicare Other | Admitting: Podiatry

## 2023-11-11 ENCOUNTER — Encounter: Payer: Self-pay | Admitting: Podiatry

## 2023-11-11 DIAGNOSIS — M79674 Pain in right toe(s): Secondary | ICD-10-CM | POA: Diagnosis not present

## 2023-11-11 DIAGNOSIS — M79675 Pain in left toe(s): Secondary | ICD-10-CM | POA: Diagnosis not present

## 2023-11-11 DIAGNOSIS — F32A Depression, unspecified: Secondary | ICD-10-CM | POA: Diagnosis not present

## 2023-11-11 DIAGNOSIS — L84 Corns and callosities: Secondary | ICD-10-CM | POA: Diagnosis not present

## 2023-11-11 DIAGNOSIS — M21371 Foot drop, right foot: Secondary | ICD-10-CM | POA: Diagnosis not present

## 2023-11-11 DIAGNOSIS — Z89421 Acquired absence of other right toe(s): Secondary | ICD-10-CM | POA: Diagnosis not present

## 2023-11-11 DIAGNOSIS — B351 Tinea unguium: Secondary | ICD-10-CM

## 2023-11-11 DIAGNOSIS — Z8619 Personal history of other infectious and parasitic diseases: Secondary | ICD-10-CM | POA: Diagnosis not present

## 2023-11-11 DIAGNOSIS — I129 Hypertensive chronic kidney disease with stage 1 through stage 4 chronic kidney disease, or unspecified chronic kidney disease: Secondary | ICD-10-CM | POA: Diagnosis not present

## 2023-11-11 DIAGNOSIS — E785 Hyperlipidemia, unspecified: Secondary | ICD-10-CM | POA: Diagnosis not present

## 2023-11-11 DIAGNOSIS — N3281 Overactive bladder: Secondary | ICD-10-CM | POA: Diagnosis not present

## 2023-11-11 DIAGNOSIS — Z4781 Encounter for orthopedic aftercare following surgical amputation: Secondary | ICD-10-CM | POA: Diagnosis not present

## 2023-11-11 DIAGNOSIS — I872 Venous insufficiency (chronic) (peripheral): Secondary | ICD-10-CM | POA: Diagnosis not present

## 2023-11-11 DIAGNOSIS — M14671 Charcot's joint, right ankle and foot: Secondary | ICD-10-CM | POA: Diagnosis not present

## 2023-11-11 DIAGNOSIS — E8809 Other disorders of plasma-protein metabolism, not elsewhere classified: Secondary | ICD-10-CM | POA: Diagnosis not present

## 2023-11-11 DIAGNOSIS — N400 Enlarged prostate without lower urinary tract symptoms: Secondary | ICD-10-CM | POA: Diagnosis not present

## 2023-11-11 NOTE — Progress Notes (Signed)
 Subjective: Chief Complaint  Patient presents with   Callouses    CALLOUS CARE LEFT FOOT    73 year old male presents the office with above concerns.  He states that he has calluses on his left foot.  He tries to soak in hot water.  Does not see any drainage or swelling or redness or any skin breakdown.  Since I saw him last he underwent a right second toe amputation by [facility].   Objective: AAO x3, NAD DP/PT pulses palpable bilaterally, CRT less than 3 seconds Sick preulcerative calluses noted left foot submetatarsal 5 as well as fifth metatarsal base without any underlying ulceration, drainage or any signs of infection noted today.  There is no skin breakdown identified.  There is been a recent right second toe potation which is healed.  Nails are mildly elongated but are hypertrophic, dystrophic with yellow, brown discoloration nails 1 through 5 on the left and 1, 3, 4, 5 on the right. No pain with calf compression, swelling, warmth, erythema  Assessment: Preulcerative callus left foot, symptomatic onychosis  Plan: -All treatment options discussed with the patient including all alternatives, risks, complications.  -Sharply debrided nails x 9 without any complications or bleeding. -Sharply debrided the hyperkeratotic preulcerative calluses x 2 on the left foot any complications or bleeding.  We discussed continued shoes, good arch support to help offload. -Daily foot inspection. -Patient encouraged to call the office with any questions, concerns, change in symptoms.   Return in about 3 months (around 02/08/2024).  Vivi Barrack DPM

## 2023-11-15 DIAGNOSIS — I119 Hypertensive heart disease without heart failure: Secondary | ICD-10-CM | POA: Diagnosis not present

## 2023-11-15 DIAGNOSIS — M21371 Foot drop, right foot: Secondary | ICD-10-CM | POA: Diagnosis not present

## 2023-11-15 DIAGNOSIS — M14671 Charcot's joint, right ankle and foot: Secondary | ICD-10-CM | POA: Diagnosis not present

## 2023-11-15 DIAGNOSIS — Z89421 Acquired absence of other right toe(s): Secondary | ICD-10-CM | POA: Diagnosis not present

## 2023-11-15 DIAGNOSIS — Z4781 Encounter for orthopedic aftercare following surgical amputation: Secondary | ICD-10-CM | POA: Diagnosis not present

## 2023-11-15 DIAGNOSIS — I872 Venous insufficiency (chronic) (peripheral): Secondary | ICD-10-CM | POA: Diagnosis not present

## 2023-11-17 DIAGNOSIS — I119 Hypertensive heart disease without heart failure: Secondary | ICD-10-CM | POA: Diagnosis not present

## 2023-11-17 DIAGNOSIS — I872 Venous insufficiency (chronic) (peripheral): Secondary | ICD-10-CM | POA: Diagnosis not present

## 2023-11-17 DIAGNOSIS — M21371 Foot drop, right foot: Secondary | ICD-10-CM | POA: Diagnosis not present

## 2023-11-17 DIAGNOSIS — M14671 Charcot's joint, right ankle and foot: Secondary | ICD-10-CM | POA: Diagnosis not present

## 2023-11-17 DIAGNOSIS — Z89421 Acquired absence of other right toe(s): Secondary | ICD-10-CM | POA: Diagnosis not present

## 2023-11-17 DIAGNOSIS — Z4781 Encounter for orthopedic aftercare following surgical amputation: Secondary | ICD-10-CM | POA: Diagnosis not present

## 2023-11-22 DIAGNOSIS — I872 Venous insufficiency (chronic) (peripheral): Secondary | ICD-10-CM | POA: Diagnosis not present

## 2023-11-22 DIAGNOSIS — M21371 Foot drop, right foot: Secondary | ICD-10-CM | POA: Diagnosis not present

## 2023-11-22 DIAGNOSIS — Z89421 Acquired absence of other right toe(s): Secondary | ICD-10-CM | POA: Diagnosis not present

## 2023-11-22 DIAGNOSIS — M14671 Charcot's joint, right ankle and foot: Secondary | ICD-10-CM | POA: Diagnosis not present

## 2023-11-22 DIAGNOSIS — I119 Hypertensive heart disease without heart failure: Secondary | ICD-10-CM | POA: Diagnosis not present

## 2023-11-22 DIAGNOSIS — Z4781 Encounter for orthopedic aftercare following surgical amputation: Secondary | ICD-10-CM | POA: Diagnosis not present

## 2023-11-24 ENCOUNTER — Ambulatory Visit: Payer: Medicare Other | Admitting: Podiatry

## 2023-11-30 DIAGNOSIS — M21371 Foot drop, right foot: Secondary | ICD-10-CM | POA: Diagnosis not present

## 2023-11-30 DIAGNOSIS — Z89421 Acquired absence of other right toe(s): Secondary | ICD-10-CM | POA: Diagnosis not present

## 2023-11-30 DIAGNOSIS — I872 Venous insufficiency (chronic) (peripheral): Secondary | ICD-10-CM | POA: Diagnosis not present

## 2023-11-30 DIAGNOSIS — I119 Hypertensive heart disease without heart failure: Secondary | ICD-10-CM | POA: Diagnosis not present

## 2023-11-30 DIAGNOSIS — Z4781 Encounter for orthopedic aftercare following surgical amputation: Secondary | ICD-10-CM | POA: Diagnosis not present

## 2023-11-30 DIAGNOSIS — M14671 Charcot's joint, right ankle and foot: Secondary | ICD-10-CM | POA: Diagnosis not present

## 2023-12-21 DIAGNOSIS — Z961 Presence of intraocular lens: Secondary | ICD-10-CM | POA: Diagnosis not present

## 2023-12-29 ENCOUNTER — Ambulatory Visit: Admitting: Podiatry

## 2023-12-29 DIAGNOSIS — M216X2 Other acquired deformities of left foot: Secondary | ICD-10-CM

## 2023-12-29 DIAGNOSIS — L84 Corns and callosities: Secondary | ICD-10-CM | POA: Diagnosis not present

## 2023-12-30 NOTE — Progress Notes (Signed)
 Subjective: Chief Complaint  Patient presents with   RFC    RM#13 callus/RFC right foot only.    73 year old male presents the office with above concerns.  States he has pain along the calluses left foot submetatarsal 5 that he points to.  He does not report any open lesions or any swelling redness or any drainage.  No recent injuries to the area.  He has no other concerns today.  Objective: AAO x3, NAD DP/PT pulses palpable bilaterally, CRT less than 3 seconds Sick preulcerative calluses noted left foot submetatarsal 5.  Upon debridement there is no underlying ulceration, drainage or any signs of infection.  There is no open lesions identified.  There is no erythema or warmth.  Prominent tarsal head noted. No pain with calf compression, swelling, warmth, erythema  Assessment: Preulcerative callus left foot, prominent metatarsal head  Plan: -All treatment options discussed with the patient including all alternatives, risks, complications.  -Sharply debrided the hyperkeratotic preulcerative calluses x 1 on the left foot with any complications open as a courtesy to make sure there is no underlying ulceration.  He needs to keep the pressure off this area.  Dispensed a gel metatarsal pad to help decrease pressure.  He may benefit from custom insoles in the future to help decrease pressure.  Monitor closely for any signs or symptoms of infection or skin breakdown, and to let us  know immediately should any occur.  Return in about 6 weeks (around 02/09/2024) for pre-ulcerative callus.  Charity Conch DPM

## 2024-01-30 DIAGNOSIS — N401 Enlarged prostate with lower urinary tract symptoms: Secondary | ICD-10-CM | POA: Diagnosis not present

## 2024-01-30 DIAGNOSIS — R351 Nocturia: Secondary | ICD-10-CM | POA: Diagnosis not present

## 2024-01-30 DIAGNOSIS — R3915 Urgency of urination: Secondary | ICD-10-CM | POA: Diagnosis not present

## 2024-02-02 ENCOUNTER — Ambulatory Visit (INDEPENDENT_AMBULATORY_CARE_PROVIDER_SITE_OTHER): Admitting: Podiatry

## 2024-02-02 ENCOUNTER — Encounter: Payer: Self-pay | Admitting: Podiatry

## 2024-02-02 DIAGNOSIS — L84 Corns and callosities: Secondary | ICD-10-CM

## 2024-02-02 DIAGNOSIS — T148XXA Other injury of unspecified body region, initial encounter: Secondary | ICD-10-CM

## 2024-02-06 NOTE — Progress Notes (Signed)
 Subjective: Chief Complaint  Patient presents with   Callouses    Calluses on left foot lateral. Blister forming between the 1st and 2nd toe. 0 pain.     73 year old male presents the office with above concerns.  States he has been doing well.  He has not seen any drainage or pus or any open lesions.  He does not report any new concerns today.   Objective: AAO x3, NAD DP/PT pulses palpable bilaterally, CRT less than 3 seconds Sick preulcerative calluses noted left foot submetatarsal 5.  Upon debridement there is no definitive skin breakdown but the area is still preulcerative.  There is no surrounding erythema, ascending cellulitis.  There is no fluctuation or crepitation.  No drainage or pus.  There is a new area of blister which she did not notice in between the 1st and 2nd toes in the sulcus plantarly.  I drained this and it was bloody fluid.  No signs of infection otherwise. No pain with calf compression, swelling, warmth, erythema  Assessment: Preulcerative callus left foot, prominent metatarsal head; blister  Plan: -All treatment options discussed with the patient including all alternatives, risks, complications.  -Sharply debrided the hyperkeratotic preulcerative calluses x 1 on the left foot with any complications or bleeding.  Continue moisturizer, offloading. - I cleaned the area blister with alcohol  and I did puncture this to drain the fluid.  There is no purulence or signs of infection otherwise.  Discussed. -Monitor for any clinical signs or symptoms of infection and directed to call the office immediately should any occur or go to the ER.  Return in about 4 weeks (around 03/01/2024) for pre-ulcerative callus.  Charity Conch DPM

## 2024-02-09 ENCOUNTER — Ambulatory Visit: Payer: Medicare Other | Admitting: Podiatry

## 2024-02-10 ENCOUNTER — Ambulatory Visit: Admitting: Podiatry

## 2024-02-23 DIAGNOSIS — L820 Inflamed seborrheic keratosis: Secondary | ICD-10-CM | POA: Diagnosis not present

## 2024-02-23 DIAGNOSIS — L821 Other seborrheic keratosis: Secondary | ICD-10-CM | POA: Diagnosis not present

## 2024-03-01 ENCOUNTER — Ambulatory Visit: Admitting: Podiatry

## 2024-03-02 ENCOUNTER — Ambulatory Visit (INDEPENDENT_AMBULATORY_CARE_PROVIDER_SITE_OTHER): Admitting: Podiatry

## 2024-03-02 DIAGNOSIS — M2042 Other hammer toe(s) (acquired), left foot: Secondary | ICD-10-CM | POA: Diagnosis not present

## 2024-03-02 DIAGNOSIS — L84 Corns and callosities: Secondary | ICD-10-CM | POA: Diagnosis not present

## 2024-03-02 DIAGNOSIS — M216X2 Other acquired deformities of left foot: Secondary | ICD-10-CM | POA: Diagnosis not present

## 2024-03-02 DIAGNOSIS — Z89421 Acquired absence of other right toe(s): Secondary | ICD-10-CM

## 2024-03-02 DIAGNOSIS — M2041 Other hammer toe(s) (acquired), right foot: Secondary | ICD-10-CM | POA: Diagnosis not present

## 2024-03-03 NOTE — Progress Notes (Signed)
 Subjective: Chief Complaint  Patient presents with   Callouses    RM#13 Left foot pre-ulcerative callus follow up.    73 year old male presents the office with above concerns.  States he has not seen any opening, bleeding or any drainage.  No present swelling or redness.  He is asking diabetic shoes, although he is not diabetic to help decrease the pressure on the feet. He does have a history of right lesser toe amputation.  Otherwise he states the right foot has been doing great.  Objective: AAO x3, NAD DP/PT pulses palpable bilaterally, CRT less than 3 seconds Sick preulcerative calluses noted left foot submetatarsal 5 into the fifth metatarsal base.  Debridement there is no definitive ulceration identified but there is some dried blood present under the ulcer fifth.  There is no surrounding erythema, ascending cellulitis.  There is no fluctuation or crepitation.  There is no malodor.  Area of the blister on the first interspace has resolved however there is still some small amount of scabbing still noted. No pain with calf compression, swelling, warmth, erythema  Assessment: Preulcerative callus left foot, prominent metatarsal head  Plan: -All treatment options discussed with the patient including all alternatives, risks, complications.  -Debrided the hyperkeratotic lesions left foot without any complications or bleeding.  Discussed continue offloading, moisturizer.  I do think he will benefit from inserts and orthopedic type shoe. Will send referral to [facility].  -Daily foot inspection  Return in about 4 weeks (around 03/30/2024) for pre-ulcerative calluses.  Trevor [name] DPM

## 2024-03-27 ENCOUNTER — Ambulatory Visit (INDEPENDENT_AMBULATORY_CARE_PROVIDER_SITE_OTHER): Admitting: Podiatry

## 2024-03-27 ENCOUNTER — Encounter: Payer: Self-pay | Admitting: Podiatry

## 2024-03-27 VITALS — BP 142/84 | HR 70 | Temp 98.1°F | Resp 18 | Ht 76.0 in | Wt 250.0 lb

## 2024-03-27 DIAGNOSIS — L97512 Non-pressure chronic ulcer of other part of right foot with fat layer exposed: Secondary | ICD-10-CM

## 2024-03-27 DIAGNOSIS — L84 Corns and callosities: Secondary | ICD-10-CM

## 2024-03-27 MED ORDER — DOXYCYCLINE HYCLATE 100 MG PO TABS: 100.0000 mg | ORAL_TABLET | Freq: Two times a day (BID) | ORAL | 0 refills | Status: AC

## 2024-03-27 MED ORDER — MUPIROCIN 2 % EX OINT: 1.0000 | TOPICAL_OINTMENT | Freq: Two times a day (BID) | CUTANEOUS | 2 refills | Status: AC

## 2024-03-27 NOTE — Progress Notes (Signed)
 Subjective: Chief Complaint  Patient presents with   Callouses    Patient is here for F/U for pre-ulcerative calluses bilateral   73 year old male presents the office with above concerns.  States he recently did go to the beach but is not reporting open lesions but he does have a thicker callus.  Does not report any drainage or pus.  No fevers or chills.   Objective: AAO x3, NAD DP/PT pulses palpable bilaterally, CRT less than 3 seconds There is a keratotic lesion right foot submetatarsal 5 without any underlying infection.  Today there is a thick hyperkeratotic lesion with blister formation noted submetatarsal 1.  Once I debrided this there was a superficial.  There is macerated, hyperkeratotic periwound.  The whole area was larger but the actual open granular wound was about 1 cm there is no probing, undermining or tunneling.  There is no malodor. No pain with calf compression, swelling, warmth, erythema  Assessment: Ulceration  Plan: -All treatment options discussed with the patient including all alternatives, risks, complications.  -The necessary wound debridement performed on the foot submetatarsal 1.  I was not able to measure the wound prior to debridement as there is thick callus and blister formation.  I debrided nonviable, devitalized tissue in order to promote wound healing.  The post wound measurements are noted above.  There is no blood loss.  I cleaned the area saline.  Silvadene  applied followed by dressing.  Continue offloading.  He is monitor closely for any signs or symptoms of wash the foot with soap and water daily, dry thoroughly apply a similar bandage. -Doxycyline -Sharp debride the hyperkeratotic lesion submetatarsal 5 without any complications or bleeding.  Return in about 2 weeks (around 04/10/2024) for right foot ulcer.  X-ray right foot next appointment if ulcer still present  Donnice JONELLE Fees DPM

## 2024-04-02 ENCOUNTER — Ambulatory Visit: Admitting: Podiatry

## 2024-04-17 ENCOUNTER — Encounter: Payer: Self-pay | Admitting: Podiatry

## 2024-04-17 ENCOUNTER — Ambulatory Visit (INDEPENDENT_AMBULATORY_CARE_PROVIDER_SITE_OTHER): Admitting: Podiatry

## 2024-04-17 DIAGNOSIS — L84 Corns and callosities: Secondary | ICD-10-CM | POA: Diagnosis not present

## 2024-04-17 DIAGNOSIS — M216X1 Other acquired deformities of right foot: Secondary | ICD-10-CM

## 2024-04-17 DIAGNOSIS — M216X2 Other acquired deformities of left foot: Secondary | ICD-10-CM

## 2024-04-17 DIAGNOSIS — L97512 Non-pressure chronic ulcer of other part of right foot with fat layer exposed: Secondary | ICD-10-CM

## 2024-04-18 NOTE — Progress Notes (Signed)
 Subjective: Chief Complaint  Patient presents with   Foot Ulcer    Rm13 F/u ulceration on right foot/ patient says that he is doing well today.    73 year old male presents the office with above concerns.  States that the wounds are doing much better.  His wife changes the bandages daily.  Denies any drainage or swelling or redness.  Does not report any fevers or chills.     Objective: AAO x3, NAD DP/PT pulses palpable bilaterally, CRT less than 3 seconds There is a hyperkeratotic lesion right foot submetatarsal 5 and submetatarsal 1.  There is dried blood present of the callus but overall patient is doing much better.  No definitive skin breakdown today but the areas are preulcerative.  There is no surrounding erythema, ascending cellulitis.  No drainage or pus.  No fluctuation or crepitation.  There is no malodor. Prominence of the metatarsal heads noted. No pain with calf compression, swelling, warmth, erythema  Assessment: Healing ulceration, preulcerative lesions  Plan: -All treatment options discussed with the patient including all alternatives, risks, complications.  -Sharp debrided all the calluses without any complications or bleeding.  They are preulcerative and I would recommend continue daily dressing changes, offloading. -I deferred x-rays today as they seem to be healing nicely and there is no signs of infection today.  Continue to monitor. -Monitor for any clinical signs or symptoms of infection and directed to call the office immediately should any occur or go to the ER.  Return in about 2 weeks (around 05/01/2024).  X-ray of wound is present.  Trevor [name] Fees DPM

## 2024-05-01 DIAGNOSIS — R269 Unspecified abnormalities of gait and mobility: Secondary | ICD-10-CM | POA: Diagnosis not present

## 2024-05-01 DIAGNOSIS — I712 Thoracic aortic aneurysm, without rupture, unspecified: Secondary | ICD-10-CM | POA: Diagnosis not present

## 2024-05-01 DIAGNOSIS — G894 Chronic pain syndrome: Secondary | ICD-10-CM | POA: Diagnosis not present

## 2024-05-01 DIAGNOSIS — I3139 Other pericardial effusion (noninflammatory): Secondary | ICD-10-CM | POA: Diagnosis not present

## 2024-05-01 DIAGNOSIS — R7301 Impaired fasting glucose: Secondary | ICD-10-CM | POA: Diagnosis not present

## 2024-05-01 DIAGNOSIS — R2689 Other abnormalities of gait and mobility: Secondary | ICD-10-CM | POA: Diagnosis not present

## 2024-05-01 DIAGNOSIS — R413 Other amnesia: Secondary | ICD-10-CM | POA: Diagnosis not present

## 2024-05-01 DIAGNOSIS — F330 Major depressive disorder, recurrent, mild: Secondary | ICD-10-CM | POA: Diagnosis not present

## 2024-05-01 DIAGNOSIS — S98131A Complete traumatic amputation of one right lesser toe, initial encounter: Secondary | ICD-10-CM | POA: Diagnosis not present

## 2024-05-01 DIAGNOSIS — M14679 Charcot's joint, unspecified ankle and foot: Secondary | ICD-10-CM | POA: Diagnosis not present

## 2024-05-01 DIAGNOSIS — N1831 Chronic kidney disease, stage 3a: Secondary | ICD-10-CM | POA: Diagnosis not present

## 2024-05-01 DIAGNOSIS — I129 Hypertensive chronic kidney disease with stage 1 through stage 4 chronic kidney disease, or unspecified chronic kidney disease: Secondary | ICD-10-CM | POA: Diagnosis not present

## 2024-05-03 ENCOUNTER — Ambulatory Visit: Admitting: Podiatry

## 2024-05-15 ENCOUNTER — Ambulatory Visit (INDEPENDENT_AMBULATORY_CARE_PROVIDER_SITE_OTHER): Admitting: Podiatry

## 2024-05-15 ENCOUNTER — Ambulatory Visit (INDEPENDENT_AMBULATORY_CARE_PROVIDER_SITE_OTHER)

## 2024-05-15 DIAGNOSIS — L97512 Non-pressure chronic ulcer of other part of right foot with fat layer exposed: Secondary | ICD-10-CM | POA: Diagnosis not present

## 2024-05-15 DIAGNOSIS — L84 Corns and callosities: Secondary | ICD-10-CM

## 2024-05-15 DIAGNOSIS — M216X2 Other acquired deformities of left foot: Secondary | ICD-10-CM | POA: Diagnosis not present

## 2024-05-15 MED ORDER — GENTAMICIN SULFATE 0.1 % EX OINT: 1.0000 | TOPICAL_OINTMENT | Freq: Three times a day (TID) | CUTANEOUS | 0 refills | Status: AC

## 2024-05-15 NOTE — Progress Notes (Unsigned)
 Subjective: Chief Complaint  Patient presents with   Diabetic Ulcer    Rm12 f/u ulceration bilateral/ calloused over with no pain   73 year old male presents the office today for follow-up evaluation of preulcerative callus, wound.  He states he has not seen any drainage or pus.  No swelling or redness.  No pain although he does have neuropathy.  No fevers or chills.  Objective: AAO x3, NAD DP/PT pulses palpable bilaterally, CRT less than 3 seconds Preulcerative callus left foot submetatarsal 5 without any underlying ulceration, drainage or signs of infection.  On the left foot submetatarsal 1 is a preulcerative callus.  Upon debridement the wound measures 1 x 0.8 x 0.2 cm with granulation tissue present.  There is no surrounding erythema, ascending cellulitis.  No fluctuation or crepitation.  No malodor. No pain with calf compression, swelling, warmth, erythema  Assessment: Ulceration right foot; preulcerative callus left foot   Plan: Preulcerative callus left foot -Sharply debrided x 1 without any complications or bleeding.  Discussed moisturizer, offloading.  Ulceration right foot - Medically necessary wound debridement performed today.  I sharply debrided the ulceration with #312 with scalpel to remove nonviable, devitalized tissue in order to promote wound healing.  I debrided the wound down to healthy, bleeding, granular tissue.  Was not able to measure the wound prior debridement as it is covered with callus.  After debridement the wound measurements are above and measured to be 1 x 0.8 x 0.2 cm.  This is through the subcutaneous tissue.  Tissue is noted.  There is no probing to bone.  There is no undermining or tunneling.  Hemostasis achieved with manual compression.  I cleaned the wound with saline.  Antibiotic ointment is applied followed by dressing. -Prescribed gentamicin  ointment to site of the wound with a dressing - Offloading pad reapplied to his insert for shoe. -Monitor for  any clinical signs or symptoms of infection and directed to call the office immediately should any occur or go to the ER.  Return in about 2 weeks (around 05/29/2024) for right foot ulcer.  Donnice JONELLE Fees DPM

## 2024-05-24 ENCOUNTER — Ambulatory Visit: Admitting: Physical Therapy

## 2024-05-29 ENCOUNTER — Ambulatory Visit (INDEPENDENT_AMBULATORY_CARE_PROVIDER_SITE_OTHER): Admitting: Podiatry

## 2024-05-29 DIAGNOSIS — L97512 Non-pressure chronic ulcer of other part of right foot with fat layer exposed: Secondary | ICD-10-CM

## 2024-05-29 DIAGNOSIS — M216X2 Other acquired deformities of left foot: Secondary | ICD-10-CM | POA: Diagnosis not present

## 2024-05-29 DIAGNOSIS — L84 Corns and callosities: Secondary | ICD-10-CM | POA: Diagnosis not present

## 2024-05-30 NOTE — Progress Notes (Signed)
 Subjective: No chief complaint on file.    73 year old male presents the office today for follow-up evaluation of preulcerative callus, wound.  He states he was doing well but started becoming more sore again about a week ago.  He has not seen any drainage or pus increasing swelling or redness.  Does not report any fevers or chills.   ROS: Negative except for otherwise noted above.  Objective: AAO x3, NAD DP/PT pulses palpable bilaterally, CRT less than 3 seconds Preulcerative callus left foot submetatarsal 5 without any underlying ulceration, drainage or signs of infection.  Prominence of metatarsal head plantarly. On the left foot submetatarsal 1 is a hyperkeratotic lesion with small central wound present.  After debrided this the wound is smaller redness or 0.5 x 0.5 x 0.3 cm.  Prior to debridement the wound was smaller measuring 0.2 x 0.3 x 0.1 cm.  There is no probing to bone, undermining or tunneling.  No fluctuation, crepitation, malodor. No pain with calf compression, swelling, warmth, erythema  Assessment: Ulceration right foot; preulcerative callus left foot   Plan: Preulcerative callus left foot -Sharply debrided x 1 without any complications or bleeding.  Discussed moisturizer, offloading.  While there is no open lesions identified there is some dried blood under the callus and he is to monitor this very closely. - I have Trish, pedorthist evaluate the patient to help further offload.  Given his brace as well as along the discrepancy I think needs further offloading.  He does not wear inserts inside of his shoes and actually took out the insert that came inside of his shoes which I think is increasing the pressure to this area.  Ulceration right foot Procedure: Excisional Debridement of Wound Tool: Sharp #312 chisel blade/tissue nipper Type of Debridement: Sharp Excisional Frequency: @periodically  until appropriately healed.  Dressing is to be changed daily/keeping the wound  clean and dry Rationale: Removal of non-viable soft tissue from the wound to promote healing.  Anesthesia: none Pre-Debridement Wound Measurements: 0.2 cm x 0.3 cm x 0.1 cm  Post-Debridement Wound Measurements: 0.5 cm x 0.5 cm x 0.3 cm  Area devitalized tissue removed(nonviable tissue only): 0.5 cm x 0.5 cm.  Blood loss: Minimal (<50cc) Depth of Debridement: with fat layer exposed Description of tissue removed: Non-viable tissue Technique: The wound and the surrounding skin were prepped and draped in usual aseptic fashion.  Aseptic technique was maintained throughout the procedure.  Using #312 blade/tissue nipper sharp debridement of necrotic/nonviable tissue was performed until healthy bleeding wound bed was achieved.  No underlying bone or tendon was exposed during debridement.  The wound was thoroughly irrigated with normal saline solution Wound Progress:  Current Wound Volume: Debridement was performed of the chronic nonhealing diabetic foot wound on right foot Metatarsal head.  Debridement removed 0.5 cm x 0.5 cm of the necrotic tissue and subcutaneous tissue and none amount of purulent drainage was not present. Presence/absence of tissue: Necrotic tissue/nonviable tissue present at the base of the wound.  Sharp debridement was performed to remove the necrotic tissue/nonviable tissue back to viable tissue.  No devitalized/nonviable tissue present postdebridement.  Wound appeared clean and clear of infection No material in the wound was present that was identified to be inhibiting healing. Dressing: Anasept applied followed by a dry, sterile dressing. Disposition: Patient tolerated procedure well. Patient to return in 1 week for follow-up or as listed above.  Donnice JONELLE Fees DPM

## 2024-06-04 ENCOUNTER — Encounter: Payer: Self-pay | Admitting: Internal Medicine

## 2024-06-14 ENCOUNTER — Ambulatory Visit (INDEPENDENT_AMBULATORY_CARE_PROVIDER_SITE_OTHER): Admitting: Podiatry

## 2024-06-14 ENCOUNTER — Encounter: Payer: Self-pay | Admitting: Podiatry

## 2024-06-14 VITALS — Ht 76.0 in | Wt 250.0 lb

## 2024-06-14 DIAGNOSIS — L97512 Non-pressure chronic ulcer of other part of right foot with fat layer exposed: Secondary | ICD-10-CM

## 2024-06-14 NOTE — Progress Notes (Signed)
 Subjective: Chief Complaint  Patient presents with   Foot Ulcer    Pt is here to f/u on right foot due to ulcer.    73 year old male presents the office today for follow-up evaluation of preulcerative callus, wound.  States he is doing well.  He has no pain although he does have neuropathy.  Does not report any drainage or pus.  No increase in swelling or redness.  No fevers or chills.  No other concerns.    ROS: Negative except for otherwise noted above.  Objective: AAO x3, NAD DP/PT pulses palpable bilaterally, CRT less than 3 seconds Preulcerative callus left foot submetatarsal 5 without any underlying ulceration, drainage or signs of infection.  Prominence of metatarsal head plantarly. On the left foot submetatarsal 1 is a hyperkeratotic lesion with small central wound present.  After debridement the wound was larger measuring 1 x 0.8 x 0.3 cm.  Prior debridement the wound was smaller as it is mostly covered with callus measuring 0.3 x 0.3 x 0.1 cm.  There is no probing to bone, undermining or tunneling.  There is no surrounding erythema, ascending cellulitis.  No fluctuation or crepitation but there is no malodor. No pain with calf compression, swelling, warmth, erythema    Assessment: Ulceration right foot; preulcerative callus left foot   Plan: Preulcerative callus left foot -Sharply debrided x 1 without any complications or bleeding.  Discussed moisturizer, offloading.  While there is no open lesions identified there is some dried blood under the callus and he is to monitor this very closely. - Previously seen by Lolita, pedorthist for offloading.  Ulceration right foot -See debridement note below. -Referral to wound care center given nonhealing   Procedure: Excisional Debridement of Wound Tool: Sharp #312 chisel blade/tissue nipper Type of Debridement: Sharp Excisional Frequency: @periodically  until appropriately healed.  Dressing is to be changed daily/keeping the wound  clean and dry Rationale: Removal of non-viable soft tissue from the wound to promote healing.  Anesthesia: none Pre-Debridement Wound Measurements: 0.3 cm x 0.3 cm x 0.1 cm  Post-Debridement Wound Measurements: 1 cm x 0.8 cm x 0.3 cm  Area devitalized tissue removed(nonviable tissue only): 1 cm x 0.8 cm.  Blood loss: Minimal (<50cc) Depth of Debridement: with fat layer exposed Description of tissue removed: Non-viable tissue Technique: The wound and the surrounding skin were prepped and draped in usual aseptic fashion.  Aseptic technique was maintained throughout the procedure.  Using #312 blade/tissue nipper sharp debridement of necrotic/nonviable tissue was performed until healthy bleeding wound bed was achieved.  No underlying bone or tendon was exposed during debridement.  The wound was thoroughly irrigated with normal saline solution Wound Progress:  Current Wound Volume: Debridement was performed of the chronic nonhealing diabetic foot wound on right foot Metatarsal head.  Debridement removed 0.5 cm x 0.5 cm of the necrotic tissue and subcutaneous tissue and none amount of purulent drainage was not present. Presence/absence of tissue: Necrotic tissue/nonviable tissue present at the base of the wound.  Sharp debridement was performed to remove the necrotic tissue/nonviable tissue back to viable tissue.  No devitalized/nonviable tissue present postdebridement.  Wound appeared clean and clear of infection No material in the wound was present that was identified to be inhibiting healing. Dressing: Prisma was applied followed by a dry, sterile dressing. Disposition: Patient tolerated procedure well. Patient to return in 1 week for follow-up or as listed above.  Trevor [name] DPM

## 2024-06-14 NOTE — Patient Instructions (Signed)
 Wash wound with saline. Dry well. Apply a small amount of antibiotic ointment and then apply the Prisma to the wound followed by a bandage. Change daily.  Monitor for any signs/symptoms of infection. Call the office immediately if any occur or go directly to the emergency room. Call with any questions/concerns. Any

## 2024-07-02 ENCOUNTER — Ambulatory Visit: Admitting: Podiatry

## 2024-07-02 DIAGNOSIS — L97512 Non-pressure chronic ulcer of other part of right foot with fat layer exposed: Secondary | ICD-10-CM | POA: Diagnosis not present

## 2024-07-02 NOTE — Patient Instructions (Signed)
 Wash wound with saline. Dry well. Apply a small amount of antibiotic ointment and then apply the Prisma to the wound followed by a bandage. Change daily.   Monitor for any signs/symptoms of infection. Call the office immediately if any occur or go directly to the emergency room. Call with any questions/concerns.

## 2024-07-04 ENCOUNTER — Encounter (HOSPITAL_BASED_OUTPATIENT_CLINIC_OR_DEPARTMENT_OTHER): Attending: General Surgery | Admitting: General Surgery

## 2024-07-04 DIAGNOSIS — L97512 Non-pressure chronic ulcer of other part of right foot with fat layer exposed: Secondary | ICD-10-CM | POA: Diagnosis not present

## 2024-07-04 DIAGNOSIS — L97522 Non-pressure chronic ulcer of other part of left foot with fat layer exposed: Secondary | ICD-10-CM | POA: Diagnosis not present

## 2024-07-04 DIAGNOSIS — G629 Polyneuropathy, unspecified: Secondary | ICD-10-CM | POA: Diagnosis not present

## 2024-07-10 ENCOUNTER — Other Ambulatory Visit

## 2024-07-11 ENCOUNTER — Encounter (HOSPITAL_BASED_OUTPATIENT_CLINIC_OR_DEPARTMENT_OTHER): Admitting: General Surgery

## 2024-07-11 DIAGNOSIS — L97512 Non-pressure chronic ulcer of other part of right foot with fat layer exposed: Secondary | ICD-10-CM | POA: Diagnosis not present

## 2024-07-11 DIAGNOSIS — G629 Polyneuropathy, unspecified: Secondary | ICD-10-CM | POA: Diagnosis not present

## 2024-07-11 DIAGNOSIS — L97522 Non-pressure chronic ulcer of other part of left foot with fat layer exposed: Secondary | ICD-10-CM | POA: Diagnosis not present

## 2024-07-12 ENCOUNTER — Ambulatory Visit (HOSPITAL_BASED_OUTPATIENT_CLINIC_OR_DEPARTMENT_OTHER): Admitting: General Surgery

## 2024-07-16 ENCOUNTER — Ambulatory Visit: Admitting: Podiatry

## 2024-07-16 DIAGNOSIS — L84 Corns and callosities: Secondary | ICD-10-CM | POA: Diagnosis not present

## 2024-07-16 DIAGNOSIS — L97512 Non-pressure chronic ulcer of other part of right foot with fat layer exposed: Secondary | ICD-10-CM | POA: Diagnosis not present

## 2024-07-16 DIAGNOSIS — M216X2 Other acquired deformities of left foot: Secondary | ICD-10-CM | POA: Diagnosis not present

## 2024-07-16 DIAGNOSIS — M21542 Acquired clubfoot, left foot: Secondary | ICD-10-CM | POA: Diagnosis not present

## 2024-07-16 NOTE — Progress Notes (Signed)
 Subjective: Chief Complaint  Patient presents with   Foot Ulcer    Right foot ulcer follow up      73 year old male presents the office today for follow-up evaluation of preulcerative callus, wound. He did go to the wound care center and they have been using silver  dressing on the wound daily.  He does not report any drainage or pus.  His main concern today is that his left ankle turns.  He is scheduled to follow-up with Lolita, pedorthist in our office on Thursday for possible bracing.  He does not report any fevers or chills.  ROS: Negative except for otherwise noted above.  Objective: AAO x3, NAD DP/PT pulses palpable bilaterally, CRT less than 3 seconds Preulcerative callus left foot submetatarsal 5 without this and dried blood present.  Upon debridement there is minimal bleeding noted.  There is no surrounding erythema, ascending cellulitis there is no drainage or pus or any signs of infection noted today. On the left foot submetatarsal 1 is a full-thickness ulceration but appears to be smaller in nature.  Measures approximate 0.4 x 0.4 x 0.2 cm.  There is no probing to bone, undermining or tunneling.  There is no fluctuation, crepitation or malodor. Varus deformity of the left ankle although still somewhat flexible.  No significant area of pinpoint tenderness. No pain with calf compression, swelling, warmth, erythema  Assessment: Ulceration right foot; preulcerative callus left foot   Plan: Preulcerative callus left foot -Sharply debrided x 1.  Minimal bleeding occurred today.  Antibiotic ointment is applied followed by bandage.  Ulceration right foot - I did lightly debride the wound today and overall wound is doing better.  No signs of infection today.  Send continue with the current wound care treatment plan by the wound care center.  Offloading.  Ankle instability, deformity left ankle - Follow-up for bracing options.  Return in about 6 weeks (around 08/27/2024) for foot  exam/ nail trim .  Donnice JONELLE Fees DPM

## 2024-07-17 DIAGNOSIS — M205X1 Other deformities of toe(s) (acquired), right foot: Secondary | ICD-10-CM | POA: Diagnosis not present

## 2024-07-17 DIAGNOSIS — M7732 Calcaneal spur, left foot: Secondary | ICD-10-CM | POA: Diagnosis not present

## 2024-07-17 DIAGNOSIS — M14671 Charcot's joint, right ankle and foot: Secondary | ICD-10-CM | POA: Diagnosis not present

## 2024-07-17 DIAGNOSIS — M2041 Other hammer toe(s) (acquired), right foot: Secondary | ICD-10-CM | POA: Diagnosis not present

## 2024-07-17 DIAGNOSIS — M7731 Calcaneal spur, right foot: Secondary | ICD-10-CM | POA: Diagnosis not present

## 2024-07-17 DIAGNOSIS — M19071 Primary osteoarthritis, right ankle and foot: Secondary | ICD-10-CM | POA: Diagnosis not present

## 2024-07-17 DIAGNOSIS — T84213A Breakdown (mechanical) of internal fixation device of bones of foot and toes, initial encounter: Secondary | ICD-10-CM | POA: Diagnosis not present

## 2024-07-17 DIAGNOSIS — X58XXXA Exposure to other specified factors, initial encounter: Secondary | ICD-10-CM | POA: Diagnosis not present

## 2024-07-17 DIAGNOSIS — M2011 Hallux valgus (acquired), right foot: Secondary | ICD-10-CM | POA: Diagnosis not present

## 2024-07-17 DIAGNOSIS — G629 Polyneuropathy, unspecified: Secondary | ICD-10-CM | POA: Diagnosis not present

## 2024-07-17 DIAGNOSIS — S99912A Unspecified injury of left ankle, initial encounter: Secondary | ICD-10-CM | POA: Diagnosis not present

## 2024-07-17 DIAGNOSIS — M19072 Primary osteoarthritis, left ankle and foot: Secondary | ICD-10-CM | POA: Diagnosis not present

## 2024-07-18 ENCOUNTER — Encounter (HOSPITAL_BASED_OUTPATIENT_CLINIC_OR_DEPARTMENT_OTHER): Attending: General Surgery | Admitting: General Surgery

## 2024-07-18 ENCOUNTER — Ambulatory Visit (HOSPITAL_BASED_OUTPATIENT_CLINIC_OR_DEPARTMENT_OTHER): Admitting: General Surgery

## 2024-07-18 DIAGNOSIS — L97522 Non-pressure chronic ulcer of other part of left foot with fat layer exposed: Secondary | ICD-10-CM | POA: Diagnosis not present

## 2024-07-18 DIAGNOSIS — G629 Polyneuropathy, unspecified: Secondary | ICD-10-CM | POA: Diagnosis not present

## 2024-07-18 DIAGNOSIS — L97512 Non-pressure chronic ulcer of other part of right foot with fat layer exposed: Secondary | ICD-10-CM | POA: Diagnosis present

## 2024-07-24 ENCOUNTER — Other Ambulatory Visit

## 2024-07-26 ENCOUNTER — Encounter (HOSPITAL_BASED_OUTPATIENT_CLINIC_OR_DEPARTMENT_OTHER): Admitting: General Surgery

## 2024-08-02 ENCOUNTER — Encounter (HOSPITAL_BASED_OUTPATIENT_CLINIC_OR_DEPARTMENT_OTHER): Admitting: General Surgery

## 2024-08-02 DIAGNOSIS — L97522 Non-pressure chronic ulcer of other part of left foot with fat layer exposed: Secondary | ICD-10-CM | POA: Diagnosis not present

## 2024-08-02 DIAGNOSIS — L97512 Non-pressure chronic ulcer of other part of right foot with fat layer exposed: Secondary | ICD-10-CM | POA: Diagnosis not present

## 2024-08-02 DIAGNOSIS — G629 Polyneuropathy, unspecified: Secondary | ICD-10-CM | POA: Diagnosis not present

## 2024-08-17 ENCOUNTER — Encounter (HOSPITAL_BASED_OUTPATIENT_CLINIC_OR_DEPARTMENT_OTHER): Admitting: General Surgery

## 2024-08-17 DIAGNOSIS — G629 Polyneuropathy, unspecified: Secondary | ICD-10-CM | POA: Insufficient documentation

## 2024-08-17 DIAGNOSIS — L97512 Non-pressure chronic ulcer of other part of right foot with fat layer exposed: Secondary | ICD-10-CM | POA: Diagnosis present

## 2024-08-17 DIAGNOSIS — L97522 Non-pressure chronic ulcer of other part of left foot with fat layer exposed: Secondary | ICD-10-CM | POA: Diagnosis not present

## 2024-08-22 ENCOUNTER — Telehealth: Payer: Self-pay

## 2024-08-22 NOTE — Telephone Encounter (Signed)
 RN spoke to patient's wife, Keiondre Colee, and scheduled recall colonoscopy and previsit for patient, Trevor [name].  Colonoscopy scheduled on Wednesday 10/24/2024 at 12:30 PM with Dr. Avram.  Pre-visit scheduled on Monday 09/24/2024 at 2:00 PM- RM 50.   Mrs. lanier millon understanding.   [facility] number given if reschedule is needed.

## 2024-08-27 ENCOUNTER — Ambulatory Visit: Admitting: Podiatry

## 2024-08-27 DIAGNOSIS — B351 Tinea unguium: Secondary | ICD-10-CM | POA: Diagnosis not present

## 2024-08-27 DIAGNOSIS — Z89421 Acquired absence of other right toe(s): Secondary | ICD-10-CM

## 2024-08-27 DIAGNOSIS — M79674 Pain in right toe(s): Secondary | ICD-10-CM

## 2024-08-27 DIAGNOSIS — L84 Corns and callosities: Secondary | ICD-10-CM

## 2024-08-27 DIAGNOSIS — M79675 Pain in left toe(s): Secondary | ICD-10-CM | POA: Diagnosis not present

## 2024-08-27 NOTE — Progress Notes (Signed)
 Subjective: Chief Complaint  Patient presents with   Foot Ulcer    Right foot ulcer follow up      74 year old male presents the office today for follow-up evaluation of preulcerative callus, wound.  He continues to go to the wound care center and he is actively being treated by them for this issue.  His nails are so thick and elongated he cannot trim himself and they cause discomfort.  ROS: Negative except for otherwise noted above.  Objective: AAO x3, NAD DP/PT pulses palpable bilaterally, CRT less than 3 seconds On the right foot submetatarsal 1.  The ulcer is healed there is minimal callus formation present.  On the left foot there is still superficial area skin breakdown fifth MTPJ without any edema, erythema or signs of infection.  There is a hyperkeratotic lesion just on the plantar aspect of the fifth metatarsal base of the left foot without any ulcerations.   Nails are hypertrophic, dystrophic, brittle, discolored, elongated 10. No surrounding redness or drainage. Tenderness nails 1-5 bilaterally except the right second toe which has been amputated.  Left toenail is loose but no signs of infection. No pain with calf compression, swelling, warmth, erythema  Assessment: Ulceration right foot; preulcerative callus left foot; symptomatic onychomycosis  Plan: Preulcerative callus left foot -Sharply debrided x 1 without any complications or bleeding.  Ulceration right foot - Will defer to the wound care center  Symptomatic onychomycosis - Sharply debrided nails x 9 without complications or bleeding  Return in about 2 months (around 10/28/2024) for nail/callus trim.  Trevor [name] DPM

## 2024-08-31 ENCOUNTER — Encounter (HOSPITAL_BASED_OUTPATIENT_CLINIC_OR_DEPARTMENT_OTHER): Admitting: General Surgery

## 2024-08-31 DIAGNOSIS — L97522 Non-pressure chronic ulcer of other part of left foot with fat layer exposed: Secondary | ICD-10-CM | POA: Diagnosis not present

## 2024-09-12 ENCOUNTER — Encounter (HOSPITAL_BASED_OUTPATIENT_CLINIC_OR_DEPARTMENT_OTHER): Admitting: General Surgery

## 2024-09-12 DIAGNOSIS — L97522 Non-pressure chronic ulcer of other part of left foot with fat layer exposed: Secondary | ICD-10-CM | POA: Diagnosis not present

## 2024-09-24 ENCOUNTER — Other Ambulatory Visit: Payer: Self-pay

## 2024-09-24 ENCOUNTER — Ambulatory Visit

## 2024-09-24 VITALS — Ht 76.0 in | Wt 250.0 lb

## 2024-09-24 DIAGNOSIS — Z860100 Personal history of colon polyps, unspecified: Secondary | ICD-10-CM

## 2024-09-24 MED ORDER — NA SULFATE-K SULFATE-MG SULF 17.5-3.13-1.6 GM/177ML PO SOLN: 1.0000 | Freq: Once | ORAL | 0 refills | Status: AC

## 2024-09-24 NOTE — Progress Notes (Signed)
 Denies allergies to eggs or soy products. Denies complication of anesthesia or sedation. Denies use of weight loss medication. Denies use of O2.   Emmi instructions given for colonoscopy.

## 2024-09-25 ENCOUNTER — Encounter (HOSPITAL_BASED_OUTPATIENT_CLINIC_OR_DEPARTMENT_OTHER): Attending: General Surgery | Admitting: General Surgery

## 2024-09-25 DIAGNOSIS — L97512 Non-pressure chronic ulcer of other part of right foot with fat layer exposed: Secondary | ICD-10-CM | POA: Insufficient documentation

## 2024-09-25 DIAGNOSIS — G629 Polyneuropathy, unspecified: Secondary | ICD-10-CM | POA: Insufficient documentation

## 2024-09-27 ENCOUNTER — Encounter: Payer: Self-pay | Admitting: Podiatry

## 2024-09-27 ENCOUNTER — Ambulatory Visit: Admitting: Podiatrist

## 2024-09-27 DIAGNOSIS — M216X2 Other acquired deformities of left foot: Secondary | ICD-10-CM

## 2024-09-27 DIAGNOSIS — L97511 Non-pressure chronic ulcer of other part of right foot limited to breakdown of skin: Secondary | ICD-10-CM

## 2024-09-27 DIAGNOSIS — Z89421 Acquired absence of other right toe(s): Secondary | ICD-10-CM

## 2024-09-27 NOTE — Progress Notes (Signed)
 Right is 1 inch shorter than left after a compound fracture right  Needs a shoe built up on right and a custom orthotic to offload his foot  Discussed with Dr. Gershon-  decided he would be served best by going to [facility] where they can make a built up shoe and do an orthotic  Rx written

## 2024-09-28 NOTE — Telephone Encounter (Signed)
 I called and spoke with the patient's wife on 09/27/2024 at 7:15pm. I explained to her the situation with Lolita being out which she did not know. She states they had been trying to call but never got an answer and a lot of run around. They are fine waiting until Mattie comes back. I told them we would add [place] onto Tricia's schedule on the same day he is scheduled to see me in February and if she was not back yet then we can re-evaluate. Can we put this on the casting schedule with a note about only wanting to see Lolita. Thank you!

## 2024-10-09 ENCOUNTER — Encounter (HOSPITAL_BASED_OUTPATIENT_CLINIC_OR_DEPARTMENT_OTHER): Admitting: General Surgery

## 2024-10-09 DIAGNOSIS — L97512 Non-pressure chronic ulcer of other part of right foot with fat layer exposed: Secondary | ICD-10-CM | POA: Diagnosis not present

## 2024-10-23 ENCOUNTER — Encounter (HOSPITAL_BASED_OUTPATIENT_CLINIC_OR_DEPARTMENT_OTHER): Admitting: General Surgery

## 2024-10-24 ENCOUNTER — Encounter: Admitting: Internal Medicine

## 2024-10-25 ENCOUNTER — Encounter: Admitting: Internal Medicine

## 2024-10-29 ENCOUNTER — Ambulatory Visit: Admitting: Podiatry

## 2024-10-29 ENCOUNTER — Other Ambulatory Visit
